# Patient Record
Sex: Female | Born: 1970
Health system: Southern US, Community
[De-identification: ages and names within clinical notes are randomized; demographics above are authoritative.]

## PROBLEM LIST (undated history)

## (undated) ENCOUNTER — Emergency Department (HOSPITAL_COMMUNITY): Admission: EM | Source: Home / Self Care

## (undated) DIAGNOSIS — E0590 Thyrotoxicosis, unspecified without thyrotoxic crisis or storm: Secondary | ICD-10-CM

## (undated) DIAGNOSIS — F419 Anxiety disorder, unspecified: Secondary | ICD-10-CM

## (undated) DIAGNOSIS — F32A Depression, unspecified: Secondary | ICD-10-CM

## (undated) DIAGNOSIS — B1920 Unspecified viral hepatitis C without hepatic coma: Secondary | ICD-10-CM

## (undated) DIAGNOSIS — N809 Endometriosis, unspecified: Secondary | ICD-10-CM

## (undated) DIAGNOSIS — M549 Dorsalgia, unspecified: Secondary | ICD-10-CM

## (undated) DIAGNOSIS — F329 Major depressive disorder, single episode, unspecified: Secondary | ICD-10-CM

## (undated) DIAGNOSIS — G8929 Other chronic pain: Secondary | ICD-10-CM

## (undated) HISTORY — PX: TONSILLECTOMY: SUR1361

## (undated) HISTORY — PX: RHINOPLASTY: SUR1284

---

## 1998-07-02 ENCOUNTER — Other Ambulatory Visit: Admission: RE | Admit: 1998-07-02 | Discharge: 1998-07-02 | Payer: Self-pay | Admitting: Obstetrics & Gynecology

## 1998-11-01 ENCOUNTER — Emergency Department (HOSPITAL_COMMUNITY): Admission: EM | Admit: 1998-11-01 | Discharge: 1998-11-01 | Payer: Self-pay | Admitting: Emergency Medicine

## 2000-01-29 ENCOUNTER — Other Ambulatory Visit: Admission: RE | Admit: 2000-01-29 | Discharge: 2000-01-29 | Payer: Self-pay | Admitting: Obstetrics & Gynecology

## 2001-07-25 ENCOUNTER — Other Ambulatory Visit: Admission: RE | Admit: 2001-07-25 | Discharge: 2001-07-25 | Payer: Self-pay | Admitting: Obstetrics & Gynecology

## 2001-09-02 ENCOUNTER — Ambulatory Visit (HOSPITAL_COMMUNITY): Admission: RE | Admit: 2001-09-02 | Discharge: 2001-09-02 | Payer: Self-pay | Admitting: Obstetrics & Gynecology

## 2001-09-02 ENCOUNTER — Encounter (INDEPENDENT_AMBULATORY_CARE_PROVIDER_SITE_OTHER): Payer: Self-pay

## 2002-02-24 ENCOUNTER — Emergency Department (HOSPITAL_COMMUNITY): Admission: EM | Admit: 2002-02-24 | Discharge: 2002-02-24 | Payer: Self-pay | Admitting: Emergency Medicine

## 2002-05-04 ENCOUNTER — Other Ambulatory Visit: Admission: RE | Admit: 2002-05-04 | Discharge: 2002-05-04 | Payer: Self-pay | Admitting: Obstetrics & Gynecology

## 2002-07-17 ENCOUNTER — Ambulatory Visit (HOSPITAL_COMMUNITY): Admission: RE | Admit: 2002-07-17 | Discharge: 2002-07-17 | Payer: Self-pay | Admitting: Family Medicine

## 2002-07-29 ENCOUNTER — Inpatient Hospital Stay (HOSPITAL_COMMUNITY): Admission: AD | Admit: 2002-07-29 | Discharge: 2002-07-29 | Payer: Self-pay | Admitting: *Deleted

## 2002-09-14 ENCOUNTER — Inpatient Hospital Stay (HOSPITAL_COMMUNITY): Admission: AD | Admit: 2002-09-14 | Discharge: 2002-09-16 | Payer: Self-pay | Admitting: Obstetrics & Gynecology

## 2002-12-26 ENCOUNTER — Other Ambulatory Visit: Admission: RE | Admit: 2002-12-26 | Discharge: 2002-12-26 | Payer: Self-pay | Admitting: Obstetrics & Gynecology

## 2003-05-21 ENCOUNTER — Emergency Department (HOSPITAL_COMMUNITY): Admission: AD | Admit: 2003-05-21 | Discharge: 2003-05-21 | Payer: Self-pay | Admitting: Family Medicine

## 2004-03-08 ENCOUNTER — Emergency Department (HOSPITAL_COMMUNITY): Admission: EM | Admit: 2004-03-08 | Discharge: 2004-03-08 | Payer: Self-pay | Admitting: Emergency Medicine

## 2004-05-08 ENCOUNTER — Ambulatory Visit (HOSPITAL_COMMUNITY): Admission: RE | Admit: 2004-05-08 | Discharge: 2004-05-08 | Payer: Self-pay | Admitting: Otolaryngology

## 2004-05-08 ENCOUNTER — Encounter (INDEPENDENT_AMBULATORY_CARE_PROVIDER_SITE_OTHER): Payer: Self-pay | Admitting: Specialist

## 2004-05-08 ENCOUNTER — Ambulatory Visit (HOSPITAL_BASED_OUTPATIENT_CLINIC_OR_DEPARTMENT_OTHER): Admission: RE | Admit: 2004-05-08 | Discharge: 2004-05-08 | Payer: Self-pay | Admitting: Otolaryngology

## 2006-01-07 ENCOUNTER — Ambulatory Visit: Payer: Self-pay | Admitting: Pulmonary Disease

## 2006-01-07 ENCOUNTER — Inpatient Hospital Stay (HOSPITAL_COMMUNITY): Admission: EM | Admit: 2006-01-07 | Discharge: 2006-01-08 | Payer: Self-pay | Admitting: Emergency Medicine

## 2006-02-21 ENCOUNTER — Emergency Department (HOSPITAL_COMMUNITY): Admission: EM | Admit: 2006-02-21 | Discharge: 2006-02-21 | Payer: Self-pay | Admitting: Emergency Medicine

## 2006-06-06 ENCOUNTER — Emergency Department (HOSPITAL_COMMUNITY): Admission: EM | Admit: 2006-06-06 | Discharge: 2006-06-06 | Payer: Self-pay | Admitting: Emergency Medicine

## 2006-12-02 ENCOUNTER — Inpatient Hospital Stay (HOSPITAL_COMMUNITY): Admission: AD | Admit: 2006-12-02 | Discharge: 2006-12-02 | Payer: Self-pay | Admitting: Obstetrics and Gynecology

## 2007-03-20 ENCOUNTER — Emergency Department (HOSPITAL_COMMUNITY): Admission: EM | Admit: 2007-03-20 | Discharge: 2007-03-20 | Payer: Self-pay | Admitting: Emergency Medicine

## 2007-08-08 ENCOUNTER — Inpatient Hospital Stay (HOSPITAL_COMMUNITY): Admission: AD | Admit: 2007-08-08 | Discharge: 2007-08-08 | Payer: Self-pay | Admitting: Family Medicine

## 2007-09-06 ENCOUNTER — Emergency Department (HOSPITAL_COMMUNITY): Admission: EM | Admit: 2007-09-06 | Discharge: 2007-09-06 | Payer: Self-pay | Admitting: Emergency Medicine

## 2009-10-02 ENCOUNTER — Ambulatory Visit: Payer: Self-pay | Admitting: Obstetrics and Gynecology

## 2009-10-02 LAB — CONVERTED CEMR LAB
Clue Cells Wet Prep HPF POC: NONE SEEN
HCV Ab: NEGATIVE
Hepatitis B Surface Ag: NEGATIVE
TSH: 0.503 microintl units/mL (ref 0.350–4.500)
Trich, Wet Prep: NONE SEEN
WBC, Wet Prep HPF POC: NONE SEEN

## 2009-12-04 ENCOUNTER — Ambulatory Visit: Payer: Self-pay | Admitting: Obstetrics and Gynecology

## 2010-05-28 ENCOUNTER — Encounter: Payer: Self-pay | Admitting: Family Medicine

## 2010-05-28 ENCOUNTER — Other Ambulatory Visit: Payer: Self-pay | Admitting: Family Medicine

## 2010-05-28 ENCOUNTER — Ambulatory Visit (INDEPENDENT_AMBULATORY_CARE_PROVIDER_SITE_OTHER): Payer: Self-pay | Admitting: Obstetrics and Gynecology

## 2010-05-28 DIAGNOSIS — N893 Dysplasia of vagina, unspecified: Secondary | ICD-10-CM

## 2010-05-28 DIAGNOSIS — N949 Unspecified condition associated with female genital organs and menstrual cycle: Secondary | ICD-10-CM

## 2010-05-28 DIAGNOSIS — R102 Pelvic and perineal pain: Secondary | ICD-10-CM

## 2010-05-28 LAB — CONVERTED CEMR LAB: GC Probe Amp, Genital: NEGATIVE

## 2010-05-29 ENCOUNTER — Encounter: Payer: Self-pay | Admitting: Family Medicine

## 2010-05-29 LAB — CONVERTED CEMR LAB: Yeast Wet Prep HPF POC: NONE SEEN

## 2010-06-03 ENCOUNTER — Ambulatory Visit (HOSPITAL_COMMUNITY): Payer: Self-pay

## 2010-06-11 ENCOUNTER — Ambulatory Visit: Payer: Self-pay | Admitting: Obstetrics and Gynecology

## 2010-06-12 ENCOUNTER — Encounter: Payer: Self-pay | Admitting: Obstetrics and Gynecology

## 2010-06-12 ENCOUNTER — Ambulatory Visit (HOSPITAL_COMMUNITY): Payer: Self-pay

## 2010-06-14 ENCOUNTER — Inpatient Hospital Stay (HOSPITAL_COMMUNITY)
Admission: AD | Admit: 2010-06-14 | Discharge: 2010-06-14 | Disposition: A | Discharge status: Home / Self Care | Payer: Self-pay | Source: Ambulatory Visit | Attending: Obstetrics & Gynecology | Admitting: Obstetrics & Gynecology

## 2010-06-14 DIAGNOSIS — M545 Low back pain, unspecified: Secondary | ICD-10-CM | POA: Insufficient documentation

## 2010-06-14 DIAGNOSIS — N949 Unspecified condition associated with female genital organs and menstrual cycle: Secondary | ICD-10-CM | POA: Insufficient documentation

## 2010-06-14 DIAGNOSIS — R109 Unspecified abdominal pain: Secondary | ICD-10-CM | POA: Insufficient documentation

## 2010-06-14 LAB — URINALYSIS, ROUTINE W REFLEX MICROSCOPIC
Bilirubin Urine: NEGATIVE
Glucose, UA: NEGATIVE mg/dL
Ketones, ur: NEGATIVE mg/dL
Leukocytes, UA: NEGATIVE
Nitrite: NEGATIVE
Protein, ur: NEGATIVE mg/dL
Specific Gravity, Urine: 1.005 (ref 1.005–1.030)
Urobilinogen, UA: 0.2 mg/dL (ref 0.0–1.0)
pH: 5.5 (ref 5.0–8.0)

## 2010-06-14 LAB — URINE MICROSCOPIC-ADD ON

## 2010-06-14 LAB — POCT PREGNANCY, URINE: Preg Test, Ur: NEGATIVE

## 2010-06-22 LAB — POCT URINALYSIS DIP (DEVICE)
Bilirubin Urine: NEGATIVE
Glucose, UA: NEGATIVE mg/dL
Ketones, ur: NEGATIVE mg/dL
Nitrite: NEGATIVE
Protein, ur: NEGATIVE mg/dL
Specific Gravity, Urine: <=1.005 (ref 1.005–1.030)
Urobilinogen, UA: 0.2 mg/dL (ref 0.0–1.0)
pH: 5 (ref 5.0–8.0)

## 2010-06-27 NOTE — Progress Notes (Unsigned)
Kristina Hardy, PONTARELLI              ACCOUNT NO.:  [id number]  MEDICAL RECORD NO.:  [id number]           PATIENT TYPE:  A  LOCATION:  [facility]                   FACILITY:  [facility]  PHYSICIAN:  Argentina Donovan, MD        DATE OF BIRTH:  [birth year]  DATE OF SERVICE:  05/28/2010                                 CLINIC NOTE  The patient is a 40 year old white female, gravida 3, para 3-0-0-3 who is an Charity fundraiser at [employer].  She has recurrent incapacitating dysmenorrhea, but not severe dyspareunia.  She has tried Depo-Provera, she bled every day on that.  She is over 43 and smokes.  She needs financial aid.  She has three children, all by different gentleman, and now has a paramour.  She is worried about infidelity.  Last night, she had severe pain after intercourse, with a very heavy terrible foul discharge.  She has recurrent BV.  I think she has adenomyosis by history.  She has had three normal vaginal deliveries and really needs a hysterectomy.  I am giving her Flagyl, to take for 2 weeks.  I did a wet prep, GC/chlamydia; examination did not seem to elicit much pain.  The uterus is of normal size, shape, and consistency.  The adnexa, because of full bladder, could not really feel the ovaries.  We talked about this.  She understands BV.  She understands adenomyosis. She has not taken the time really to get an application for financial aid here.  Although they offer her health insurance at work, she cannot afford it because it is 600 dollars a month.  IMPRESSION:  Dyspareunia and probable adenomyosis, recurrent BV, and terrible discharge with foul odor and emotional instability secondary to familial problems.          ______________________________ Argentina Donovan, MD    PR/MEDQ  D:  05/28/2010  T:  05/29/2010  Job:  [id number]
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
## 2010-12-31 LAB — CBC
HCT: 41.7
Hemoglobin: 14.4
MCHC: 34.7
MCV: 94.8
Platelets: 272
RBC: 4.4
RDW: 11.9
WBC: 11.4 — ABNORMAL HIGH

## 2010-12-31 LAB — URINALYSIS, ROUTINE W REFLEX MICROSCOPIC
Bilirubin Urine: NEGATIVE
Glucose, UA: NEGATIVE
Ketones, ur: NEGATIVE
Leukocytes, UA: NEGATIVE
Nitrite: NEGATIVE
Protein, ur: NEGATIVE
Specific Gravity, Urine: 1.015
Urobilinogen, UA: 0.2
pH: 5.5

## 2010-12-31 LAB — POCT PREGNANCY, URINE
Operator id: 242691
Preg Test, Ur: NEGATIVE

## 2010-12-31 LAB — WET PREP, GENITAL
Clue Cells Wet Prep HPF POC: NONE SEEN
Trich, Wet Prep: NONE SEEN
Yeast Wet Prep HPF POC: NONE SEEN

## 2010-12-31 LAB — GC/CHLAMYDIA PROBE AMP, GENITAL
Chlamydia, DNA Probe: NEGATIVE
GC Probe Amp, Genital: NEGATIVE

## 2010-12-31 LAB — URINE MICROSCOPIC-ADD ON

## 2011-01-12 LAB — D-DIMER, QUANTITATIVE: D-Dimer, Quant: <0.22

## 2011-01-16 LAB — URINALYSIS, ROUTINE W REFLEX MICROSCOPIC
Glucose, UA: NEGATIVE
Ketones, ur: NEGATIVE
Protein, ur: NEGATIVE
Urobilinogen, UA: 0.2

## 2011-01-16 LAB — WET PREP, GENITAL
Trich, Wet Prep: NONE SEEN
Yeast Wet Prep HPF POC: NONE SEEN

## 2011-01-16 LAB — URINE MICROSCOPIC-ADD ON

## 2011-10-21 ENCOUNTER — Other Ambulatory Visit: Payer: Self-pay | Admitting: Family Medicine

## 2011-10-21 DIAGNOSIS — Z1231 Encounter for screening mammogram for malignant neoplasm of breast: Secondary | ICD-10-CM

## 2011-10-27 ENCOUNTER — Emergency Department (HOSPITAL_COMMUNITY)
Admission: EM | Admit: 2011-10-27 | Discharge: 2011-10-28 | Disposition: A | Discharge status: Home / Self Care | Payer: Medicaid Other | Attending: Emergency Medicine | Admitting: Emergency Medicine

## 2011-10-27 ENCOUNTER — Emergency Department (HOSPITAL_COMMUNITY): Payer: Medicaid Other

## 2011-10-27 ENCOUNTER — Encounter (HOSPITAL_COMMUNITY): Payer: Self-pay | Admitting: *Deleted

## 2011-10-27 DIAGNOSIS — M25472 Effusion, left ankle: Secondary | ICD-10-CM

## 2011-10-27 DIAGNOSIS — B1920 Unspecified viral hepatitis C without hepatic coma: Secondary | ICD-10-CM | POA: Insufficient documentation

## 2011-10-27 DIAGNOSIS — F17200 Nicotine dependence, unspecified, uncomplicated: Secondary | ICD-10-CM | POA: Insufficient documentation

## 2011-10-27 DIAGNOSIS — E0590 Thyrotoxicosis, unspecified without thyrotoxic crisis or storm: Secondary | ICD-10-CM | POA: Insufficient documentation

## 2011-10-27 DIAGNOSIS — M7989 Other specified soft tissue disorders: Secondary | ICD-10-CM | POA: Insufficient documentation

## 2011-10-27 HISTORY — DX: Endometriosis, unspecified: N80.9

## 2011-10-27 HISTORY — DX: Thyrotoxicosis, unspecified without thyrotoxic crisis or storm: E05.90

## 2011-10-27 HISTORY — DX: Depression, unspecified: F32.A

## 2011-10-27 HISTORY — DX: Unspecified viral hepatitis C without hepatic coma: B19.20

## 2011-10-27 HISTORY — DX: Major depressive disorder, single episode, unspecified: F32.9

## 2011-10-27 HISTORY — DX: Anxiety disorder, unspecified: F41.9

## 2011-10-27 MED ORDER — OXYCODONE HCL 5 MG PO TABS: 5.0000 mg | ORAL_TABLET | ORAL | Status: AC | PRN

## 2011-10-27 NOTE — ED Provider Notes (Signed)
History     CSN: [id number]  Arrival date & time 10/27/11  2121   First MD Initiated Contact with Patient 10/27/11 2223      Chief Complaint  Patient presents with  . Ankle Pain    (Consider location/radiation/quality/duration/timing/severity/associated sxs/prior treatment) The history is provided by the patient.   41 year old female with hepatitis C and hyperthyroidism has not yet been treated presents to the emergency department with sudden onset gradually worsening lateral left ankle swelling and pain around 3 PM this afternoon. There is no known injury. She denies any weakness, numbness, skin color change, excess warmth. Denies any fever. No IV drug abuse. Symptoms worse with ambulation. Nothing makes it better. Has been practicing elevation and application of ice without any improvement. swelling or pain. No history of VT or PE, no recent prolonged immobility, no exogenous estrogen use.  Past Medical History  Diagnosis Date  . Hepatitis C   . Endometriosis   . Anxiety   . Depression   . Hyperthyroidism     History reviewed. No pertinent past surgical history.  No family history on file.  History  Substance Use Topics  . Smoking status: Current Everyday Smoker -- 2.0 packs/day  . Smokeless tobacco: Not on file  . Alcohol Use: Yes     Review of Systems Pertinent positives and negatives are reviewed in the history of present illness. Allergies  Nsaids and Vicodin  Home Medications   Current Outpatient Rx  Name Route Sig Dispense Refill  . ALPRAZOLAM 1 MG PO TABS Oral Take 1 mg by mouth 5 (five) times daily as needed. For anxiety.    . IBUPROFEN 200 MG PO TABS Oral Take 400 mg by mouth every 8 (eight) hours as needed. For pain.    [name] SERTRALINE HCL 50 MG PO TABS Oral Take 75 mg by mouth daily.      BP 119/86  Pulse 103  Temp 98.6 F (37 C)  Resp 20  SpO2 98%  LMP 10/13/2011  Physical Exam Vital signs are reviewed. Nursing note reviewed. Well-developed,  well-nourished female anxious appearing. Has normocephalic and atraumatic. Oromucosa moist. Conjunctiva are normal. Neck is supple. Heart with regular rate and rhythm. Bilateral dorsalis pedis pulses are 2+. Normal respiratory effort and excursion. Calves are supple and nontender. Edema to the lateral aspect of the left ankle without tenderness over the medial or lateral malleolus. Full range of motion with minimal pain. There is no overlying color change or excess warmth. No ankle instability on drawer testing. Plantar and dorsi flexion strength is 5 out of 5 and capillary refill is brisk. Patient is alert. Sensation is intact to light touch in bilateral lower extremities. Skin is warm and dry without any obvious rash or lesion. ED Course  Procedures (including critical care time)  Labs Reviewed - No data to display Dg Foot Complete Left  10/27/2011  *RADIOLOGY REPORT*  Clinical Data: Pain and swelling  LEFT FOOT - COMPLETE 3+ VIEW  Comparison: None.  Findings: Normal alignment without fracture.  Preserved joint spaces.  No significant arthropathy.  No radiographic swelling or foreign body.  IMPRESSION: No acute osseous finding  Original Report Authenticated By: Judie Petit. Ruel Favors, M.D.     1. Left ankle swelling       MDM  Left ankle swelling without any known injury. The area is not hot or red to suggest gout or septic arthritis. Imaging demonstrates no acute abnormality. No calf swelling or pain and no recent prolonged immobility or  exogenous estrogen use to suggest the vein thrombosis. I encouraged patient to followup with her regular Dr. if her symptoms aren't improving. Ace wrap was given in the emergency department to assist with compression. She has an allergy to NSAIDs and given her hepatitis C cannot tolerate acetaminophen. She is given a short course of oxycodone for pain as she has had no narcotics prescribed in the last 6 months in [place].        Shaaron Adler, PA-C 10/28/11 0028

## 2011-10-27 NOTE — ED Notes (Signed)
Pt c/o left foot swelling/pain; recently diagnosed with hep c and hyperthyroidism.  No known injury

## 2011-10-28 NOTE — ED Notes (Signed)
Ortho tech called for ACE wrap to L ankle.

## 2011-10-28 NOTE — ED Notes (Signed)
Ortho tech here to apply ACE wrap to L ankle.

## 2011-10-29 NOTE — ED Provider Notes (Signed)
Medical screening examination/treatment/procedure(s) were performed by non-physician practitioner and as supervising physician I was immediately available for consultation/collaboration.  Raeford Razor, MD 10/29/11 1038

## 2011-11-09 ENCOUNTER — Ambulatory Visit (HOSPITAL_COMMUNITY): Payer: Self-pay | Admitting: Psychiatry

## 2011-11-17 ENCOUNTER — Emergency Department (HOSPITAL_COMMUNITY)
Admission: EM | Admit: 2011-11-17 | Discharge: 2011-11-17 | Disposition: A | Discharge status: Home / Self Care | Payer: Medicaid Other | Attending: Emergency Medicine | Admitting: Emergency Medicine

## 2011-11-17 ENCOUNTER — Encounter (HOSPITAL_COMMUNITY): Payer: Self-pay | Admitting: Emergency Medicine

## 2011-11-17 ENCOUNTER — Emergency Department (HOSPITAL_COMMUNITY): Payer: Medicaid Other

## 2011-11-17 DIAGNOSIS — F17200 Nicotine dependence, unspecified, uncomplicated: Secondary | ICD-10-CM | POA: Insufficient documentation

## 2011-11-17 DIAGNOSIS — IMO0002 Reserved for concepts with insufficient information to code with codable children: Secondary | ICD-10-CM | POA: Insufficient documentation

## 2011-11-17 DIAGNOSIS — S022XXA Fracture of nasal bones, initial encounter for closed fracture: Secondary | ICD-10-CM | POA: Insufficient documentation

## 2011-11-17 DIAGNOSIS — E0590 Thyrotoxicosis, unspecified without thyrotoxic crisis or storm: Secondary | ICD-10-CM | POA: Insufficient documentation

## 2011-11-17 DIAGNOSIS — F341 Dysthymic disorder: Secondary | ICD-10-CM | POA: Insufficient documentation

## 2011-11-17 MED ORDER — OXYCODONE-ACETAMINOPHEN 5-325 MG PO TABS: 2.0000 | ORAL_TABLET | Freq: Once | ORAL | Status: DC

## 2011-11-17 MED ORDER — OXYCODONE-ACETAMINOPHEN 5-325 MG PO TABS: 1.0000 | ORAL_TABLET | ORAL | Status: DC | PRN

## 2011-11-17 MED ORDER — OXYCODONE HCL 5 MG PO TABS: 5.0000 mg | ORAL_TABLET | ORAL | Status: AC | PRN

## 2011-11-17 NOTE — ED Notes (Signed)
[place], has bruising under left eye, - no c/o severe headache, taking ibuprofen- not working.
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
## 2011-11-18 ENCOUNTER — Ambulatory Visit: Payer: Self-pay

## 2012-01-04 ENCOUNTER — Ambulatory Visit (HOSPITAL_COMMUNITY): Payer: Medicaid Other | Attending: Family Medicine

## 2012-01-06 ENCOUNTER — Ambulatory Visit: Payer: Self-pay | Admitting: Obstetrics & Gynecology

## 2012-01-12 ENCOUNTER — Encounter (HOSPITAL_COMMUNITY): Payer: Self-pay | Admitting: Emergency Medicine

## 2012-01-12 ENCOUNTER — Emergency Department (HOSPITAL_COMMUNITY)
Admission: EM | Admit: 2012-01-12 | Discharge: 2012-01-13 | Disposition: A | Discharge status: Home / Self Care | Payer: Medicaid Other | Attending: Emergency Medicine | Admitting: Emergency Medicine

## 2012-01-12 DIAGNOSIS — T50901A Poisoning by unspecified drugs, medicaments and biological substances, accidental (unintentional), initial encounter: Secondary | ICD-10-CM | POA: Insufficient documentation

## 2012-01-12 DIAGNOSIS — IMO0002 Reserved for concepts with insufficient information to code with codable children: Secondary | ICD-10-CM | POA: Insufficient documentation

## 2012-01-12 DIAGNOSIS — Z8619 Personal history of other infectious and parasitic diseases: Secondary | ICD-10-CM | POA: Insufficient documentation

## 2012-01-12 DIAGNOSIS — T50904A Poisoning by unspecified drugs, medicaments and biological substances, undetermined, initial encounter: Secondary | ICD-10-CM | POA: Insufficient documentation

## 2012-01-12 DIAGNOSIS — G8929 Other chronic pain: Secondary | ICD-10-CM | POA: Insufficient documentation

## 2012-01-12 DIAGNOSIS — F17200 Nicotine dependence, unspecified, uncomplicated: Secondary | ICD-10-CM | POA: Insufficient documentation

## 2012-01-12 HISTORY — DX: Other chronic pain: G89.29

## 2012-01-12 HISTORY — DX: Dorsalgia, unspecified: M54.9

## 2012-01-12 LAB — COMPREHENSIVE METABOLIC PANEL
Albumin: 4.6 g/dL (ref 3.5–5.2)
BUN: 5 mg/dL — ABNORMAL LOW (ref 6–23)
CO2: 28 mEq/L (ref 19–32)
Calcium: 10.8 mg/dL — ABNORMAL HIGH (ref 8.4–10.5)
Chloride: 103 mEq/L (ref 96–112)
Creatinine, Ser: 0.64 mg/dL (ref 0.50–1.10)
GFR calc non Af Amer: >90 mL/min (ref >90)
Total Bilirubin: 0.8 mg/dL (ref 0.3–1.2)

## 2012-01-12 LAB — ACETAMINOPHEN LEVEL: Acetaminophen (Tylenol), Serum: <15 ug/mL (ref 10–30)

## 2012-01-12 LAB — CBC
HCT: 46.1 % — ABNORMAL HIGH (ref 36.0–46.0)
MCH: 32.1 pg (ref 26.0–34.0)
MCV: 90.7 fL (ref 78.0–100.0)
RDW: 12.4 % (ref 11.5–15.5)
WBC: 11.7 10*3/uL — ABNORMAL HIGH (ref 4.0–10.5)

## 2012-01-12 LAB — ETHANOL: Alcohol, Ethyl (B): 12 mg/dL — ABNORMAL HIGH (ref 0–11)

## 2012-01-12 LAB — SALICYLATE LEVEL: Salicylate Lvl: <2 mg/dL — ABNORMAL LOW (ref 2.8–20.0)

## 2012-01-12 MED ORDER — ONDANSETRON 4 MG PO TBDP
4.0000 mg | ORAL_TABLET | Freq: Once | ORAL | Status: AC
  Administered 2012-01-13: 4 mg via ORAL
  Filled 2012-01-12: qty 1

## 2012-01-12 MED ORDER — ONDANSETRON HCL 4 MG/2ML IJ SOLN
INTRAMUSCULAR | Status: DC
Start: 2012-01-12 — End: 2012-01-13
  Filled 2012-01-12: qty 2

## 2012-01-12 MED ORDER — ACETAMINOPHEN 325 MG PO TABS
650.0000 mg | ORAL_TABLET | Freq: Once | ORAL | Status: DC
  Filled 2012-01-12: qty 2
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
## 2012-01-12 NOTE — ED Notes (Signed)
Blood work drawn by Judyann Munson

## 2012-01-12 NOTE — ED Notes (Signed)
Charge RN and [facility] aware of pt

## 2012-01-12 NOTE — ED Notes (Signed)
The pt is c/o her mouth burning and she wants something for that something for diarrhea and some pain med for the pain in her head neck and back.  She wants her personal belongings given to her boyfriend who is at the bedside

## 2012-01-12 NOTE — ED Notes (Signed)
Pt wanded by security. 

## 2012-01-12 NOTE — ED Provider Notes (Signed)
History     CSN: [id number]  Arrival date & time 01/12/12  2106   First MD Initiated Contact with Patient 01/12/12 2306      Chief Complaint  Patient presents with  . Drug Overdose    (Consider location/radiation/quality/duration/timing/severity/associated sxs/prior treatment) HPI HX per PT. Took OD of Zoloft 50mg  #15 tabs about 45 min PTA. PT upset when her pain management physician refused to increase her pain medications and PCP refused to refill narcotics. TENS unit not helping. Followed by Dr Ethelene Hal and DR Cyndia [facility]).  PT has chronic back and neck pain, endometriosis and multiple medical problems. N/V after talking zoloft.  No CP or SOB. She denies SI/ HI. Room mate called 911 when she took medications and vomited. She c/o ongoing neck pain. Sharp pain unchanged. No weakness or numbness.   Past Medical History  Diagnosis Date  . Hepatitis C   . Endometriosis   . Anxiety   . Depression   . Hyperthyroidism   . Chronic back pain     Past Surgical History  Procedure Date  . Rhinoplasty   . Tonsillectomy     History reviewed. No pertinent family history.  History  Substance Use Topics  . Smoking status: Current Every Day Smoker -- 1.0 packs/day    Types: Cigarettes  . Smokeless tobacco: Not on file  . Alcohol Use: No    OB History    Grav Para Term Preterm Abortions TAB SAB Ect Mult Living                  Review of Systems  Constitutional: Negative for fever and chills.  HENT: Negative for neck pain and neck stiffness.   Eyes: Negative for pain.  Respiratory: Negative for shortness of breath.   Cardiovascular: Negative for chest pain.  Gastrointestinal: Negative for abdominal pain.  Genitourinary: Negative for dysuria.  Musculoskeletal: Negative for back pain.  Skin: Negative for rash.  Neurological: Negative for headaches.  Psychiatric/Behavioral: Negative for hallucinations.       Agitated and tearful  All other systems reviewed and are  negative.    Allergies  Nsaids and Vicodin  Home Medications   Current Outpatient Rx  Name Route Sig Dispense Refill  . ALPRAZOLAM 1 MG PO TABS Oral Take 1 mg by mouth 5 (five) times daily as needed. For anxiety.    . IBUPROFEN 200 MG PO TABS Oral Take 400 mg by mouth every 8 (eight) hours as needed. For pain.    [name] SERTRALINE HCL 50 MG PO TABS Oral Take 75 mg by mouth daily.      BP 130/100  Pulse 83  Temp 98.3 F (36.8 C) (Oral)  Resp 20  SpO2 100%  Physical Exam  Constitutional: She is oriented to person, place, and time. She appears well-developed and well-nourished.  HENT:  Head: Normocephalic and atraumatic.  Eyes: Conjunctivae normal and EOM are normal. Pupils are equal, round, and reactive to light.  Neck: Trachea normal. Neck supple. No thyromegaly present.  Cardiovascular: Normal rate, regular rhythm, S1 normal, S2 normal and normal pulses.     No systolic murmur is present   No diastolic murmur is present  Pulses:      Radial pulses are 2+ on the right side, and 2+ on the left side.  Pulmonary/Chest: Effort normal and breath sounds normal. She has no wheezes. She has no rhonchi. She has no rales. She exhibits no tenderness.  Abdominal: Soft. Normal appearance and bowel sounds are  normal. There is no tenderness. There is no CVA tenderness and negative Murphy's sign.  Musculoskeletal:       BLE:s Calves nontender, no cords or erythema, negative Homans sign  Neurological: She is alert and oriented to person, place, and time. She has normal strength. No cranial nerve deficit or sensory deficit. GCS eye subscore is 4. GCS verbal subscore is 5. GCS motor subscore is 6.  Skin: Skin is warm and dry. No rash noted. She is not diaphoretic.  Psychiatric: Her speech is normal.       Tearful, agitated    ED Course  Procedures (including critical care time)  Results for orders placed during the hospital encounter of 01/12/12  CBC      Component Value Range   WBC 11.7  (*) 4.0 - 10.5 K/uL   RBC 5.08  3.87 - 5.11 MIL/uL   Hemoglobin 16.3 (*) 12.0 - 15.0 g/dL   HCT 16.1 (*) 09.6 - 04.5 %   MCV 90.7  78.0 - 100.0 fL   MCH 32.1  26.0 - 34.0 pg   MCHC 35.4  30.0 - 36.0 g/dL   RDW 40.9  81.1 - 91.4 %   Platelets 361  150 - 400 K/uL  COMPREHENSIVE METABOLIC PANEL      Component Value Range   Sodium 142  135 - 145 mEq/L   Potassium 4.0  3.5 - 5.1 mEq/L   Chloride 103  96 - 112 mEq/L   CO2 28  19 - 32 mEq/L   Glucose, Bld 126 (*) 70 - 99 mg/dL   BUN 5 (*) 6 - 23 mg/dL   Creatinine, Ser 7.82  0.50 - 1.10 mg/dL   Calcium 95.6 (*) 8.4 - 10.5 mg/dL   Total Protein 8.9 (*) 6.0 - 8.3 g/dL   Albumin 4.6  3.5 - 5.2 g/dL   AST 19  0 - 37 U/L   ALT 16  0 - 35 U/L   Alkaline Phosphatase 65  39 - 117 U/L   Total Bilirubin 0.8  0.3 - 1.2 mg/dL   GFR calc non Af Amer >90  >90 mL/min   GFR calc Af Amer >90  >90 mL/min  ETHANOL      Component Value Range   Alcohol, Ethyl (B) 12 (*) 0 - 11 mg/dL  ACETAMINOPHEN LEVEL      Component Value Range   Acetaminophen (Tylenol), Serum <15.0  10 - 30 ug/mL  SALICYLATE LEVEL      Component Value Range   Salicylate Lvl <2.0 (*) 2.8 - 20.0 mg/dL    Date: 21/30/8657  Rate: 41  Rhythm: normal sinus rhythm  QRS Axis: normal  Intervals: normal  ST/T Wave abnormalities: nonspecific ST changes  Conduction Disutrbances:none  Narrative Interpretation:   Old EKG Reviewed: none available  ACT consult and tele psych consult requested.  12:01 AM d/w ACT - will see in ED. PT maintains she is not suicidal, requesting narcotics.    1:24 AM PSY consult, Dr Jacky Kindle feels PT not suicidal can be discharged home with outpatient referrals. PT agreeable to plan - will follow up PCP.  MDM   Room mate called 911 for N/V after taking 15 Zolofts 50mg . PT observed in the ED, no adverse drug effects. PT denies any SI, states she wanted to get attention due to frustration over her chronic pain management and her PCP appropriately no longer  prescribing narcotics, as she has a pain management physician. Pyschiatric consultation obtained as above. Previous records  reviewed. VS and nursing notes reviewed. ECG and labs as above.         Sunnie Nielsen, MD 01/14/12 2009

## 2012-01-12 NOTE — ED Notes (Addendum)
Per EMS, took 15 zolofts (50mg ) 45 min ago; 12 lead done, vomiting--zofran given; IV started 18g LAC, pt a&ox4 107 BS, 102 palp sys BP

## 2012-01-12 NOTE — ED Notes (Addendum)
Notified poison control about pt, suggest supportive care with fluids and checking labs

## 2012-01-12 NOTE — ED Notes (Signed)
Unsuccessful blood draw attempt x2. 

## 2012-01-12 NOTE — ED Notes (Addendum)
Reports took 15-20 zoloft d/t mad at pain management doctor/PCP; called for refill on pain meds--and was refused refill; reports was trying to get attention, not trying to harm self; reports roommate was scared and called 911; pt has vomited; denies SI or HI

## 2012-01-13 MED ORDER — LOPERAMIDE HCL 2 MG PO CAPS
2.0000 mg | ORAL_CAPSULE | Freq: Once | ORAL | Status: AC
  Administered 2012-01-13: 2 mg via ORAL
  Filled 2012-01-13: qty 1

## 2012-01-13 MED ORDER — GI COCKTAIL ~~LOC~~
30.0000 mL | Freq: Once | ORAL | Status: AC
  Administered 2012-01-13: 30 mL via ORAL
  Filled 2012-01-13: qty 30

## 2012-01-13 MED ORDER — IBUPROFEN 200 MG PO TABS
400.0000 mg | ORAL_TABLET | Freq: Once | ORAL | Status: AC
  Administered 2012-01-13: 400 mg via ORAL
  Filled 2012-01-13: qty 2

## 2012-01-13 MED ORDER — CLONIDINE HCL 0.2 MG PO TABS: 0.1000 mg | ORAL_TABLET | Freq: Two times a day (BID) | ORAL | Status: DC

## 2012-01-13 NOTE — ED Notes (Signed)
The pt would not take the tylenol ordered because she has  Hep c

## 2012-01-13 NOTE — ED Notes (Signed)
The pt reports that she cannot void at present 

## 2012-01-13 NOTE — ED Notes (Signed)
Pt sitting in room waiting to talk to Dr Dierdre Highman.

## 2012-01-13 NOTE — ED Notes (Signed)
Pt left room stating this is ridiculous, I'm leaving. Called pt to give her her discharge papers and bottle of Zoloft, Pt refused to respond or stop, followed pt down hallway to lobby calling for her but pt still refused to stop.

## 2012-01-13 NOTE — ED Notes (Signed)
Telephsyc inprocess

## 2012-01-13 NOTE — ED Notes (Signed)
Discussed discharge instructions with pt. Pt then began demanding something else for pain and an RX for pain med. Told pt that Dr Dierdre Highman already stated he was not giving her an narcotics. Pt then asked for a RX for Suboxone. Told pt Dr Dierdre Highman was not going to give that to her either. Pt then asked to speak to Dr Dierdre Highman, Dr Dierdre Highman notified.

## 2012-02-04 ENCOUNTER — Ambulatory Visit: Payer: Self-pay | Admitting: Gastroenterology

## 2012-02-29 ENCOUNTER — Other Ambulatory Visit (HOSPITAL_COMMUNITY): Payer: Self-pay | Admitting: Family Medicine

## 2012-02-29 DIAGNOSIS — Z1231 Encounter for screening mammogram for malignant neoplasm of breast: Secondary | ICD-10-CM

## 2012-03-18 ENCOUNTER — Ambulatory Visit (HOSPITAL_COMMUNITY): Payer: Medicaid Other | Attending: Family Medicine

## 2012-07-12 ENCOUNTER — Ambulatory Visit (HOSPITAL_COMMUNITY): Payer: Medicaid Other | Attending: Family Medicine
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
## 2015-08-19 ENCOUNTER — Emergency Department (HOSPITAL_COMMUNITY)
Admission: EM | Admit: 2015-08-19 | Discharge: 2015-08-19 | Disposition: A | Discharge status: Home / Self Care | Payer: Medicaid Other | Attending: Emergency Medicine | Admitting: Emergency Medicine

## 2015-08-19 ENCOUNTER — Encounter (HOSPITAL_COMMUNITY): Payer: Self-pay | Admitting: Emergency Medicine

## 2015-08-19 DIAGNOSIS — Z79899 Other long term (current) drug therapy: Secondary | ICD-10-CM | POA: Diagnosis not present

## 2015-08-19 DIAGNOSIS — F17210 Nicotine dependence, cigarettes, uncomplicated: Secondary | ICD-10-CM | POA: Diagnosis not present

## 2015-08-19 DIAGNOSIS — M545 Low back pain: Secondary | ICD-10-CM | POA: Diagnosis not present

## 2015-08-19 DIAGNOSIS — F329 Major depressive disorder, single episode, unspecified: Secondary | ICD-10-CM | POA: Diagnosis not present

## 2015-08-19 NOTE — ED Notes (Signed)
MD at bedside. 

## 2015-08-19 NOTE — ED Notes (Signed)
Family at bedside. 

## 2015-08-19 NOTE — Discharge Instructions (Signed)
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
## 2018-07-08 MED FILL — oxyCODONE HCL 10 MG TABS: 10 | 15 days supply | Qty: 60 | Fill #0

## 2018-07-22 MED FILL — oxyCODONE HCL 10 MG TABS: 10 | 15 days supply | Qty: 60 | Fill #0

## 2018-08-06 MED FILL — oxyCODONE HCL 10 MG TABS: 10 | 15 days supply | Qty: 60 | Fill #0

## 2018-08-19 MED FILL — oxyCODONE HCL 10 MG TABS: 10 | 15 days supply | Qty: 60 | Fill #0

## 2018-09-02 MED FILL — oxyCODONE HCL 10 MG TABS: 10 | 15 days supply | Qty: 60 | Fill #0

## 2018-09-16 MED FILL — oxyCODONE HCL 10 MG TABS: 10 | 15 days supply | Qty: 60 | Fill #0

## 2018-09-30 MED FILL — oxyCODONE HCL 10 MG TABS: 10 | 15 days supply | Qty: 60 | Fill #0

## 2018-10-17 MED FILL — oxyCODONE HCL 10 MG TABS: 10 | 15 days supply | Qty: 60 | Fill #0

## 2018-10-29 MED FILL — oxyCODONE HCL 10 MG TABS: 10 | 15 days supply | Qty: 60 | Fill #0

## 2018-11-14 MED FILL — oxyCODONE HCL 10 MG TABS: 10 | 15 days supply | Qty: 60 | Fill #0

## 2018-11-28 MED FILL — oxyCODONE HCL 10 MG TABS: 10 | 15 days supply | Qty: 60 | Fill #0

## 2018-12-09 MED FILL — oxyCODONE HCL 10 MG TABS: 10 | 15 days supply | Qty: 60 | Fill #0

## 2018-12-26 MED FILL — oxyCODONE HCL 10 MG TABS: 10 | 15 days supply | Qty: 60 | Fill #0

## 2019-01-09 MED FILL — oxyCODONE HCL 10 MG TABS: 10 | 15 days supply | Qty: 60 | Fill #0

## 2019-01-23 MED FILL — oxyCODONE HCL 10 MG TABS: 10 | 15 days supply | Qty: 60 | Fill #0

## 2019-02-07 MED FILL — oxyCODONE HCL 10 MG TABS: 10 | 15 days supply | Qty: 60 | Fill #0

## 2019-02-20 MED FILL — CYCLOBENZAPRINE HCL 10 MG T: 10 | 20 days supply | Qty: 60 | Fill #0

## 2019-02-20 MED FILL — oxyCODONE HCL 10 MG TABS: 10 | 15 days supply | Qty: 60 | Fill #0

## 2019-03-07 MED FILL — oxyCODONE HCL 10 MG TABS: 10 | 15 days supply | Qty: 60 | Fill #0

## 2019-03-21 MED FILL — oxyCODONE HCL 10 MG TABS: 10 | 15 days supply | Qty: 60 | Fill #0

## 2019-04-03 MED FILL — oxyCODONE HCL 10 MG TABS: 10 | 15 days supply | Qty: 60 | Fill #0

## 2019-04-06 MED FILL — CYCLOBENZAPRINE HCL 10 MG T: 10 | 20 days supply | Qty: 60 | Fill #0

## 2019-04-17 MED FILL — oxyCODONE HCL 10 MG TABS: 10 | 15 days supply | Qty: 60 | Fill #0

## 2019-05-01 MED FILL — oxyCODONE HCL 10 MG TABS: 10 | 15 days supply | Qty: 60 | Fill #0

## 2019-05-11 MED FILL — CYCLOBENZAPRINE HCL 10 MG T: 10 | 20 days supply | Qty: 60 | Fill #1

## 2019-05-15 MED FILL — oxyCODONE HCL 10 MG TABS: 10 | 15 days supply | Qty: 60 | Fill #0

## 2019-05-30 MED FILL — oxyCODONE HCL 10 MG TABS: 10 | 15 days supply | Qty: 60 | Fill #0

## 2019-06-13 MED FILL — oxyCODONE HCL 10 MG TABS: 10 | 15 days supply | Qty: 60 | Fill #0

## 2019-06-26 MED FILL — oxyCODONE HCL 10 MG TABS: 10 | 15 days supply | Qty: 60 | Fill #0

## 2019-06-26 MED FILL — CYCLOBENZAPRINE HCL 10 MG T: 10 | 20 days supply | Qty: 60 | Fill #0

## 2019-07-11 MED FILL — oxyCODONE HCL 10 MG TABS: 10 | 15 days supply | Qty: 60 | Fill #0

## 2019-07-24 MED FILL — CYCLOBENZAPRINE HCL 10 MG T: 10 | 30 days supply | Qty: 90 | Fill #0

## 2019-07-24 MED FILL — oxyCODONE HCL 10 MG TABS: 10 | 15 days supply | Qty: 60 | Fill #0

## 2019-08-07 MED FILL — oxyCODONE HCL 10 MG TABS: 10 | 15 days supply | Qty: 60 | Fill #0

## 2019-08-21 MED FILL — oxyCODONE HCL 10 MG TABS: 10 | 15 days supply | Qty: 60 | Fill #0

## 2019-09-05 MED FILL — oxyCODONE HCL 10 MG TABS: 10 | 15 days supply | Qty: 60 | Fill #0

## 2019-09-18 MED FILL — oxyCODONE HCL 10 MG TABS: 10 | 15 days supply | Qty: 60 | Fill #0

## 2019-10-03 MED FILL — oxyCODONE HCL 10 MG TABS: 10 | 15 days supply | Qty: 60 | Fill #0

## 2019-10-17 MED FILL — oxyCODONE HCL 10 MG TABS: 10 | 15 days supply | Qty: 60 | Fill #0

## 2019-10-31 MED FILL — oxyCODONE HCL 10 MG TABS: 10 | 15 days supply | Qty: 60 | Fill #0

## 2019-11-15 MED FILL — oxyCODONE HCL 10 MG TABS: 10 | 15 days supply | Qty: 60 | Fill #0

## 2019-11-29 MED FILL — oxyCODONE HCL 10 MG TABS: 10 | 15 days supply | Qty: 60 | Fill #0

## 2019-12-12 MED FILL — oxyCODONE HCL 10 MG TABS: 10 | 15 days supply | Qty: 60 | Fill #0

## 2019-12-27 MED FILL — oxyCODONE HCL 10 MG TABS: 10 | 15 days supply | Qty: 60 | Fill #0

## 2020-01-09 ENCOUNTER — Other Ambulatory Visit (HOSPITAL_COMMUNITY): Payer: Self-pay | Admitting: Chiropractic Medicine

## 2020-01-09 MED FILL — oxyCODONE HCL 10 MG TABS: 10 | 15 days supply | Qty: 60 | Fill #0

## 2020-01-23 ENCOUNTER — Other Ambulatory Visit (HOSPITAL_COMMUNITY): Payer: Self-pay | Admitting: Chiropractic Medicine

## 2020-01-23 MED FILL — oxyCODONE HCL 10 MG TABS: 10 | 15 days supply | Qty: 60 | Fill #0

## 2020-02-06 ENCOUNTER — Other Ambulatory Visit (HOSPITAL_COMMUNITY): Payer: Self-pay | Admitting: Chiropractic Medicine

## 2020-02-06 MED FILL — oxyCODONE HCL 10 MG TABS: 10 | 15 days supply | Qty: 60 | Fill #0

## 2020-02-13 ENCOUNTER — Other Ambulatory Visit (HOSPITAL_COMMUNITY): Payer: Self-pay | Admitting: Chiropractic Medicine

## 2020-02-13 MED FILL — CYCLOBENZAPRINE HCL 10 MG T: 10 | 30 days supply | Qty: 90 | Fill #0

## 2020-02-20 ENCOUNTER — Other Ambulatory Visit (HOSPITAL_COMMUNITY): Payer: Self-pay | Admitting: Chiropractic Medicine

## 2020-02-20 MED FILL — oxyCODONE HCL 10 MG TABS: 10 | 15 days supply | Qty: 60 | Fill #0

## 2020-03-06 ENCOUNTER — Other Ambulatory Visit (HOSPITAL_COMMUNITY): Payer: Self-pay | Admitting: Chiropractic Medicine

## 2020-03-06 MED FILL — oxyCODONE HCL 10 MG TABS: 10 | 15 days supply | Qty: 60 | Fill #0

## 2020-03-20 ENCOUNTER — Other Ambulatory Visit (HOSPITAL_COMMUNITY): Payer: Self-pay | Admitting: Chiropractic Medicine

## 2020-03-20 MED FILL — CYCLOBENZAPRINE HCL 10 MG T: 10 | 30 days supply | Qty: 90 | Fill #0

## 2020-03-20 MED FILL — oxyCODONE HCL 10 MG TABS: 10 | 15 days supply | Qty: 60 | Fill #0

## 2020-04-02 ENCOUNTER — Other Ambulatory Visit (HOSPITAL_COMMUNITY): Payer: Self-pay | Admitting: Chiropractic Medicine

## 2020-04-02 MED FILL — oxyCODONE HCL 10 MG TABS: 10 | 15 days supply | Qty: 60 | Fill #0

## 2020-04-16 ENCOUNTER — Other Ambulatory Visit (HOSPITAL_COMMUNITY): Payer: Self-pay | Admitting: Chiropractic Medicine

## 2020-04-16 ENCOUNTER — Other Ambulatory Visit: Payer: Medicaid Other

## 2020-04-16 DIAGNOSIS — Z20822 Contact with and (suspected) exposure to covid-19: Secondary | ICD-10-CM

## 2020-04-16 MED FILL — oxyCODONE HCL 10 MG TABS: 10 | 15 days supply | Qty: 60 | Fill #0

## 2020-04-17 LAB — SARS-COV-2, NAA 2 DAY TAT

## 2020-04-17 LAB — NOVEL CORONAVIRUS, NAA: SARS-CoV-2, NAA: NOT DETECTED

## 2020-04-30 ENCOUNTER — Other Ambulatory Visit (HOSPITAL_COMMUNITY): Payer: Self-pay | Admitting: Chiropractic Medicine

## 2020-04-30 MED FILL — oxyCODONE HCL 10 MG TABS: 10 | 15 days supply | Qty: 60 | Fill #0

## 2020-05-14 ENCOUNTER — Other Ambulatory Visit (HOSPITAL_COMMUNITY): Payer: Self-pay | Admitting: Chiropractic Medicine

## 2020-05-14 MED FILL — oxyCODONE HCL 10 MG TABS: 10 | 15 days supply | Qty: 60 | Fill #0

## 2020-05-29 ENCOUNTER — Other Ambulatory Visit (HOSPITAL_COMMUNITY): Payer: Self-pay | Admitting: Chiropractic Medicine

## 2020-05-29 MED FILL — oxyCODONE HCL 10 MG TABS: 10 | 15 days supply | Qty: 60 | Fill #0

## 2020-06-12 ENCOUNTER — Other Ambulatory Visit (HOSPITAL_COMMUNITY): Payer: Self-pay | Admitting: Chiropractic Medicine

## 2020-06-12 MED FILL — oxyCODONE HCL 10 MG TABS: 10 | 15 days supply | Qty: 60 | Fill #0

## 2020-06-19 ENCOUNTER — Other Ambulatory Visit (HOSPITAL_COMMUNITY): Payer: Self-pay | Admitting: Chiropractic Medicine

## 2020-06-26 MED FILL — oxyCODONE HCL 10 MG TABS: 10 | 15 days supply | Qty: 60 | Fill #0

## 2020-07-09 ENCOUNTER — Other Ambulatory Visit (HOSPITAL_COMMUNITY): Payer: Self-pay

## 2020-07-09 MED ORDER — OXYCODONE HCL 10 MG PO TABS
10.0000 mg | ORAL_TABLET | Freq: Four times a day (QID) | ORAL | 0 refills | Status: DC | PRN
  Filled 2020-07-09: qty 60, 15d supply, fill #0

## 2020-07-10 ENCOUNTER — Other Ambulatory Visit (HOSPITAL_COMMUNITY): Payer: Self-pay

## 2020-07-23 ENCOUNTER — Other Ambulatory Visit (HOSPITAL_COMMUNITY): Payer: Self-pay

## 2020-07-23 MED ORDER — OXYCODONE HCL 10 MG PO TABS
10.0000 mg | ORAL_TABLET | Freq: Four times a day (QID) | ORAL | 0 refills | Status: DC | PRN
  Filled 2020-07-23: qty 60, 15d supply, fill #0

## 2020-07-24 ENCOUNTER — Other Ambulatory Visit (HOSPITAL_COMMUNITY): Payer: Self-pay

## 2020-08-07 ENCOUNTER — Other Ambulatory Visit (HOSPITAL_COMMUNITY): Payer: Self-pay

## 2020-08-07 MED ORDER — OXYCODONE HCL 10 MG PO TABS
10.0000 mg | ORAL_TABLET | Freq: Four times a day (QID) | ORAL | 0 refills | Status: DC | PRN
  Filled 2020-08-07: qty 60, 15d supply, fill #0

## 2020-08-21 ENCOUNTER — Other Ambulatory Visit (HOSPITAL_COMMUNITY): Payer: Self-pay

## 2020-08-21 MED ORDER — OXYCODONE HCL 10 MG PO TABS
10.0000 mg | ORAL_TABLET | Freq: Four times a day (QID) | ORAL | 0 refills | Status: DC | PRN
  Filled 2020-08-21: qty 60, 15d supply, fill #0

## 2020-09-03 ENCOUNTER — Other Ambulatory Visit (HOSPITAL_COMMUNITY): Payer: Self-pay

## 2020-09-03 MED ORDER — OXYCODONE HCL 10 MG PO TABS
10.0000 mg | ORAL_TABLET | Freq: Four times a day (QID) | ORAL | 0 refills | Status: DC | PRN
  Filled 2020-09-03: qty 60, 15d supply, fill #0

## 2020-09-17 ENCOUNTER — Other Ambulatory Visit (HOSPITAL_COMMUNITY): Payer: Self-pay

## 2020-09-17 MED ORDER — OXYCODONE HCL 10 MG PO TABS
10.0000 mg | ORAL_TABLET | Freq: Four times a day (QID) | ORAL | 0 refills | Status: DC | PRN
  Filled 2020-09-17: qty 60, 15d supply, fill #0

## 2020-10-01 ENCOUNTER — Other Ambulatory Visit (HOSPITAL_COMMUNITY): Payer: Self-pay

## 2020-10-01 MED ORDER — OXYCODONE HCL 10 MG PO TABS
10.0000 mg | ORAL_TABLET | Freq: Four times a day (QID) | ORAL | 0 refills | Status: DC | PRN
  Filled 2020-10-01: qty 60, 15d supply, fill #0

## 2020-10-15 ENCOUNTER — Other Ambulatory Visit (HOSPITAL_COMMUNITY): Payer: Self-pay

## 2020-10-15 MED ORDER — OXYCODONE HCL 10 MG PO TABS
10.0000 mg | ORAL_TABLET | Freq: Four times a day (QID) | ORAL | 0 refills | Status: DC | PRN
  Filled 2020-10-15: qty 60, 15d supply, fill #0

## 2020-10-30 ENCOUNTER — Other Ambulatory Visit (HOSPITAL_COMMUNITY): Payer: Self-pay

## 2020-10-30 MED ORDER — OXYCODONE HCL 10 MG PO TABS
10.0000 mg | ORAL_TABLET | Freq: Four times a day (QID) | ORAL | 0 refills | Status: DC | PRN
  Filled 2020-10-30: qty 60, 15d supply, fill #0

## 2020-11-13 ENCOUNTER — Other Ambulatory Visit (HOSPITAL_COMMUNITY): Payer: Self-pay

## 2020-11-13 MED ORDER — OXYCODONE HCL 10 MG PO TABS
10.0000 mg | ORAL_TABLET | Freq: Four times a day (QID) | ORAL | 0 refills | Status: DC | PRN
  Filled 2020-11-13: qty 60, 15d supply, fill #0

## 2020-11-26 ENCOUNTER — Other Ambulatory Visit (HOSPITAL_COMMUNITY): Payer: Self-pay

## 2020-11-26 MED ORDER — OXYCODONE HCL 10 MG PO TABS
10.0000 mg | ORAL_TABLET | Freq: Four times a day (QID) | ORAL | 0 refills | Status: DC | PRN
  Filled 2020-11-26: qty 60, 15d supply, fill #0

## 2020-12-10 ENCOUNTER — Other Ambulatory Visit (HOSPITAL_COMMUNITY): Payer: Self-pay

## 2020-12-10 MED ORDER — OXYCODONE HCL 10 MG PO TABS
10.0000 mg | ORAL_TABLET | Freq: Four times a day (QID) | ORAL | 0 refills | Status: DC | PRN
  Filled 2020-12-10: qty 60, 15d supply, fill #0

## 2020-12-24 ENCOUNTER — Other Ambulatory Visit (HOSPITAL_COMMUNITY): Payer: Self-pay

## 2020-12-24 MED ORDER — OXYCODONE HCL 10 MG PO TABS
10.0000 mg | ORAL_TABLET | Freq: Four times a day (QID) | ORAL | 0 refills | Status: DC | PRN
  Filled 2020-12-24: qty 60, 15d supply, fill #0

## 2021-01-07 ENCOUNTER — Other Ambulatory Visit (HOSPITAL_COMMUNITY): Payer: Self-pay

## 2021-01-07 MED ORDER — OXYCODONE HCL 10 MG PO TABS
10.0000 mg | ORAL_TABLET | Freq: Four times a day (QID) | ORAL | 0 refills | Status: DC | PRN
  Filled 2021-01-07: qty 60, 15d supply, fill #0

## 2021-01-08 ENCOUNTER — Other Ambulatory Visit (HOSPITAL_COMMUNITY): Payer: Self-pay

## 2021-01-22 ENCOUNTER — Other Ambulatory Visit (HOSPITAL_COMMUNITY): Payer: Self-pay

## 2021-01-22 MED ORDER — OXYCODONE HCL 10 MG PO TABS
ORAL_TABLET | ORAL | 0 refills | Status: DC
  Filled 2021-01-22: qty 60, 15d supply, fill #0

## 2021-02-04 ENCOUNTER — Other Ambulatory Visit (HOSPITAL_COMMUNITY): Payer: Self-pay

## 2021-02-04 MED ORDER — OXYCODONE HCL 10 MG PO TABS
ORAL_TABLET | ORAL | 0 refills | Status: DC
  Filled 2021-02-04: qty 60, 15d supply, fill #0

## 2021-02-14 ENCOUNTER — Other Ambulatory Visit (HOSPITAL_COMMUNITY): Payer: Self-pay

## 2021-02-14 MED ORDER — OXYCODONE HCL 10 MG PO TABS
ORAL_TABLET | ORAL | 0 refills | Status: DC
  Filled 2021-02-14: qty 60, 15d supply, fill #0

## 2021-02-17 ENCOUNTER — Other Ambulatory Visit (HOSPITAL_COMMUNITY): Payer: Self-pay

## 2021-03-01 ENCOUNTER — Other Ambulatory Visit (HOSPITAL_COMMUNITY): Payer: Self-pay

## 2021-03-01 MED ORDER — OXYCODONE HCL 10 MG PO TABS
10.0000 mg | ORAL_TABLET | ORAL | 0 refills | Status: DC
  Filled 2021-03-01: qty 60, 15d supply, fill #0

## 2021-03-04 ENCOUNTER — Other Ambulatory Visit (HOSPITAL_COMMUNITY): Payer: Self-pay

## 2021-03-17 ENCOUNTER — Other Ambulatory Visit (HOSPITAL_COMMUNITY): Payer: Self-pay

## 2021-03-17 MED ORDER — OXYCODONE HCL 10 MG PO TABS
ORAL_TABLET | ORAL | 0 refills | Status: DC
  Filled 2021-03-17 – 2021-03-18 (×2): qty 60, 15d supply, fill #0

## 2021-03-18 ENCOUNTER — Other Ambulatory Visit (HOSPITAL_COMMUNITY): Payer: Self-pay

## 2021-04-01 ENCOUNTER — Other Ambulatory Visit (HOSPITAL_COMMUNITY): Payer: Self-pay

## 2021-04-02 ENCOUNTER — Other Ambulatory Visit (HOSPITAL_COMMUNITY): Payer: Self-pay

## 2021-04-02 MED ORDER — OXYCODONE HCL 10 MG PO TABS
ORAL_TABLET | ORAL | 0 refills | Status: DC
  Filled 2021-04-02: qty 60, 15d supply, fill #0

## 2021-04-03 ENCOUNTER — Other Ambulatory Visit (HOSPITAL_COMMUNITY): Payer: Self-pay

## 2021-04-15 ENCOUNTER — Other Ambulatory Visit (HOSPITAL_COMMUNITY): Payer: Self-pay

## 2021-04-15 MED ORDER — OXYCODONE HCL 10 MG PO TABS
ORAL_TABLET | ORAL | 0 refills | Status: DC
  Filled 2021-04-15 – 2021-04-16 (×2): qty 60, 15d supply, fill #0

## 2021-04-16 ENCOUNTER — Other Ambulatory Visit (HOSPITAL_COMMUNITY): Payer: Self-pay

## 2021-04-30 ENCOUNTER — Other Ambulatory Visit (HOSPITAL_COMMUNITY): Payer: Self-pay

## 2021-04-30 MED ORDER — OXYCODONE HCL 10 MG PO TABS
ORAL_TABLET | ORAL | 0 refills | Status: DC
  Filled 2021-04-30: qty 60, 15d supply, fill #0

## 2021-05-14 ENCOUNTER — Other Ambulatory Visit (HOSPITAL_COMMUNITY): Payer: Self-pay

## 2021-05-14 MED ORDER — OXYCODONE HCL 10 MG PO TABS
ORAL_TABLET | ORAL | 0 refills | Status: DC
  Filled 2021-05-14 – 2021-08-06 (×3): qty 60, 15d supply, fill #0

## 2021-05-15 ENCOUNTER — Other Ambulatory Visit (HOSPITAL_COMMUNITY): Payer: Self-pay

## 2021-06-25 ENCOUNTER — Other Ambulatory Visit (HOSPITAL_COMMUNITY): Payer: Self-pay

## 2021-06-25 MED ORDER — OXYCODONE HCL 10 MG PO TABS
ORAL_TABLET | ORAL | 0 refills | Status: DC
  Filled 2021-06-25: qty 60, 15d supply, fill #0

## 2021-07-09 ENCOUNTER — Other Ambulatory Visit (HOSPITAL_COMMUNITY): Payer: Self-pay

## 2021-07-09 MED ORDER — OXYCODONE HCL 10 MG PO TABS
ORAL_TABLET | ORAL | 0 refills | Status: DC
  Filled 2021-07-09: qty 60, 15d supply, fill #0

## 2021-07-23 ENCOUNTER — Other Ambulatory Visit (HOSPITAL_COMMUNITY): Payer: Self-pay

## 2021-07-23 MED ORDER — OXYCODONE HCL 10 MG PO TABS
ORAL_TABLET | ORAL | 0 refills | Status: DC
  Filled 2021-07-23: qty 60, 15d supply, fill #0

## 2021-08-06 ENCOUNTER — Other Ambulatory Visit (HOSPITAL_COMMUNITY): Payer: Self-pay

## 2021-08-20 ENCOUNTER — Other Ambulatory Visit (HOSPITAL_COMMUNITY): Payer: Self-pay

## 2021-08-20 MED ORDER — OXYCODONE HCL 10 MG PO TABS
ORAL_TABLET | ORAL | 0 refills | Status: DC
Start: 2021-08-20 — End: 2021-09-03
  Filled 2021-08-20: qty 60, 15d supply, fill #0

## 2021-09-03 ENCOUNTER — Other Ambulatory Visit (HOSPITAL_COMMUNITY): Payer: Self-pay

## 2021-09-03 MED ORDER — OXYCODONE HCL 10 MG PO TABS
ORAL_TABLET | ORAL | 0 refills | Status: DC
  Filled 2021-09-03: qty 60, 15d supply, fill #0

## 2021-09-17 ENCOUNTER — Other Ambulatory Visit (HOSPITAL_COMMUNITY): Payer: Self-pay

## 2021-09-17 MED ORDER — OXYCODONE HCL 10 MG PO TABS
ORAL_TABLET | ORAL | 0 refills | Status: DC
  Filled 2021-09-17: qty 60, 15d supply, fill #0

## 2021-10-01 ENCOUNTER — Other Ambulatory Visit (HOSPITAL_COMMUNITY): Payer: Self-pay

## 2021-10-02 ENCOUNTER — Other Ambulatory Visit (HOSPITAL_COMMUNITY): Payer: Self-pay

## 2021-10-02 MED ORDER — OXYCODONE HCL 10 MG PO TABS
ORAL_TABLET | ORAL | 0 refills | Status: AC
Start: 2021-10-02 — End: ?
  Filled 2021-10-02: qty 60, 15d supply, fill #0

## 2021-10-14 ENCOUNTER — Other Ambulatory Visit (HOSPITAL_COMMUNITY): Payer: Self-pay

## 2021-10-15 ENCOUNTER — Other Ambulatory Visit (HOSPITAL_COMMUNITY): Payer: Self-pay

## 2021-10-16 ENCOUNTER — Other Ambulatory Visit (HOSPITAL_COMMUNITY): Payer: Self-pay

## 2021-10-16 MED ORDER — OXYCODONE HCL 10 MG PO TABS
ORAL_TABLET | ORAL | 0 refills | Status: DC
  Filled 2021-10-16: qty 60, 15d supply, fill #0

## 2021-10-29 ENCOUNTER — Other Ambulatory Visit (HOSPITAL_COMMUNITY): Payer: Self-pay

## 2021-10-29 MED ORDER — OXYCODONE HCL 10 MG PO TABS
ORAL_TABLET | ORAL | 0 refills | Status: DC
  Filled 2021-10-29: qty 60, 15d supply, fill #0

## 2021-10-30 ENCOUNTER — Other Ambulatory Visit (HOSPITAL_COMMUNITY): Payer: Self-pay

## 2021-11-12 ENCOUNTER — Emergency Department (HOSPITAL_COMMUNITY)
Admission: EM | Admit: 2021-11-12 | Discharge: 2021-11-13 | Disposition: A | Discharge status: Home / Self Care | Payer: Medicaid Other | Attending: Emergency Medicine | Admitting: Emergency Medicine

## 2021-11-12 ENCOUNTER — Encounter (HOSPITAL_COMMUNITY): Payer: Self-pay | Admitting: Emergency Medicine

## 2021-11-12 ENCOUNTER — Other Ambulatory Visit (HOSPITAL_COMMUNITY): Payer: Self-pay

## 2021-11-12 ENCOUNTER — Other Ambulatory Visit: Payer: Self-pay

## 2021-11-12 DIAGNOSIS — F1910 Other psychoactive substance abuse, uncomplicated: Secondary | ICD-10-CM | POA: Insufficient documentation

## 2021-11-12 DIAGNOSIS — R443 Hallucinations, unspecified: Secondary | ICD-10-CM | POA: Insufficient documentation

## 2021-11-12 DIAGNOSIS — F19939 Other psychoactive substance use, unspecified with withdrawal, unspecified: Secondary | ICD-10-CM | POA: Insufficient documentation

## 2021-11-12 DIAGNOSIS — F3160 Bipolar disorder, current episode mixed, unspecified: Secondary | ICD-10-CM | POA: Insufficient documentation

## 2021-11-12 DIAGNOSIS — F1920 Other psychoactive substance dependence, uncomplicated: Secondary | ICD-10-CM | POA: Diagnosis not present

## 2021-11-12 DIAGNOSIS — R45851 Suicidal ideations: Secondary | ICD-10-CM | POA: Insufficient documentation

## 2021-11-12 DIAGNOSIS — Z20822 Contact with and (suspected) exposure to covid-19: Secondary | ICD-10-CM | POA: Diagnosis not present

## 2021-11-12 DIAGNOSIS — Z1339 Encounter for screening examination for other mental health and behavioral disorders: Secondary | ICD-10-CM | POA: Insufficient documentation

## 2021-11-12 DIAGNOSIS — Z046 Encounter for general psychiatric examination, requested by authority: Secondary | ICD-10-CM | POA: Insufficient documentation

## 2021-11-12 DIAGNOSIS — F1914 Other psychoactive substance abuse with psychoactive substance-induced mood disorder: Secondary | ICD-10-CM | POA: Diagnosis not present

## 2021-11-12 LAB — COMPREHENSIVE METABOLIC PANEL
ALT: 12 U/L (ref 0–44)
AST: 26 U/L (ref 15–41)
Albumin: 4.5 g/dL (ref 3.5–5.0)
Alkaline Phosphatase: 67 U/L (ref 38–126)
Anion gap: 13 (ref 5–15)
BUN: 11 mg/dL (ref 6–20)
CO2: 22 mmol/L (ref 22–32)
Calcium: 10.1 mg/dL (ref 8.9–10.3)
Chloride: 102 mmol/L (ref 98–111)
Creatinine, Ser: 0.81 mg/dL (ref 0.44–1.00)
GFR, Estimated: >60 mL/min (ref >60)
Glucose, Bld: 91 mg/dL (ref 70–99)
Potassium: 4 mmol/L (ref 3.5–5.1)
Sodium: 137 mmol/L (ref 135–145)
Total Bilirubin: 0.6 mg/dL (ref 0.3–1.2)
Total Protein: 8.5 g/dL — ABNORMAL HIGH (ref 6.5–8.1)

## 2021-11-12 LAB — SALICYLATE LEVEL: Salicylate Lvl: 8.1 mg/dL (ref 7.0–30.0)

## 2021-11-12 LAB — CBC
HCT: 44.4 % (ref 36.0–46.0)
Hemoglobin: 15 g/dL (ref 12.0–15.0)
MCH: 29.9 pg (ref 26.0–34.0)
MCHC: 33.8 g/dL (ref 30.0–36.0)
MCV: 88.6 fL (ref 80.0–100.0)
Platelets: 261 10*3/uL (ref 150–400)
RBC: 5.01 MIL/uL (ref 3.87–5.11)
RDW: 12.5 % (ref 11.5–15.5)
WBC: 8.2 10*3/uL (ref 4.0–10.5)
nRBC: 0 % (ref 0.0–0.2)

## 2021-11-12 LAB — I-STAT BETA HCG BLOOD, ED (MC, WL, AP ONLY): I-stat hCG, quantitative: <5 m[IU]/mL (ref <5)

## 2021-11-12 LAB — ACETAMINOPHEN LEVEL: Acetaminophen (Tylenol), Serum: <10 ug/mL — ABNORMAL LOW (ref 10–30)

## 2021-11-12 LAB — ETHANOL: Alcohol, Ethyl (B): <10 mg/dL (ref <10)

## 2021-11-12 MED ORDER — LORAZEPAM 2 MG/ML IJ SOLN: 0.0000 mg | Freq: Four times a day (QID) | INTRAMUSCULAR | Status: DC

## 2021-11-12 MED ORDER — DIPHENHYDRAMINE HCL 50 MG/ML IJ SOLN
50.0000 mg | Freq: Once | INTRAMUSCULAR | Status: AC
  Administered 2021-11-12: 50 mg via INTRAMUSCULAR
  Filled 2021-11-12: qty 1

## 2021-11-12 MED ORDER — NICOTINE 21 MG/24HR TD PT24
21.0000 mg | MEDICATED_PATCH | Freq: Every day | TRANSDERMAL | Status: DC
  Administered 2021-11-13: 21 mg via TRANSDERMAL
  Filled 2021-11-12: qty 1

## 2021-11-12 MED ORDER — ZIPRASIDONE MESYLATE 20 MG IM SOLR
20.0000 mg | Freq: Once | INTRAMUSCULAR | Status: AC
  Administered 2021-11-12: 20 mg via INTRAMUSCULAR
  Filled 2021-11-12: qty 20

## 2021-11-12 MED ORDER — LORAZEPAM 1 MG PO TABS: 0.0000 mg | ORAL_TABLET | Freq: Two times a day (BID) | ORAL | Status: DC

## 2021-11-12 MED ORDER — OXYCODONE HCL 10 MG PO TABS
ORAL_TABLET | ORAL | 0 refills | Status: DC
Start: 2021-11-12 — End: 2022-01-27
  Filled 2021-11-12: qty 60, 15d supply, fill #0

## 2021-11-12 MED ORDER — ONDANSETRON HCL 4 MG PO TABS
4.0000 mg | ORAL_TABLET | Freq: Three times a day (TID) | ORAL | Status: DC | PRN
  Administered 2021-11-13: 4 mg via ORAL
  Filled 2021-11-12 (×2): qty 1

## 2021-11-12 MED ORDER — LORAZEPAM 2 MG/ML IJ SOLN: 0.0000 mg | Freq: Two times a day (BID) | INTRAMUSCULAR | Status: DC

## 2021-11-12 MED ORDER — LORAZEPAM 1 MG PO TABS
0.0000 mg | ORAL_TABLET | Freq: Four times a day (QID) | ORAL | Status: DC
  Administered 2021-11-13: 2 mg via ORAL
  Filled 2021-11-12: qty 2

## 2021-11-12 MED ORDER — THIAMINE HCL 100 MG/ML IJ SOLN
100.0000 mg | Freq: Every day | INTRAMUSCULAR | Status: DC
Start: 2021-11-12 — End: 2021-11-13
  Filled 2021-11-12 (×2): qty 1

## 2021-11-12 MED ORDER — LORAZEPAM 2 MG/ML IJ SOLN
2.0000 mg | Freq: Once | INTRAMUSCULAR | Status: AC
  Administered 2021-11-12: 2 mg via INTRAMUSCULAR
  Filled 2021-11-12: qty 1

## 2021-11-12 MED ORDER — ALUM & MAG HYDROXIDE-SIMETH 200-200-20 MG/5ML PO SUSP
30.0000 mL | Freq: Four times a day (QID) | ORAL | Status: DC | PRN
Start: 2021-11-12 — End: 2021-11-13

## 2021-11-12 MED ORDER — THIAMINE HCL 100 MG PO TABS
100.0000 mg | ORAL_TABLET | Freq: Every day | ORAL | Status: DC
Start: 2021-11-12 — End: 2021-11-13
  Administered 2021-11-13: 100 mg via ORAL
  Filled 2021-11-12: qty 1

## 2021-11-12 MED ORDER — STERILE WATER FOR INJECTION IJ SOLN
INTRAMUSCULAR | Status: AC
  Administered 2021-11-12: 1 mL
  Filled 2021-11-12: qty 10

## 2021-11-12 NOTE — ED Notes (Signed)
Pt is yelling, crying and trying to leave.  EDP notified and medicines ordered.

## 2021-11-12 NOTE — ED Provider Triage Note (Signed)
Emergency Medicine Provider Triage Evaluation Note  Kristina Hardy , a 51 y.o. female  was evaluated in triage.  Pt complains of SI per daughter. Under [employer] custody. 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   Review of Systems  Positive:  Negative:   Physical Exam  There were no vitals taken for this visit. Gen:   Awake, no distress   Resp:  Normal effort  MSK:   Moves extremities without difficulty  Other:    Medical Decision Making  Medically screening exam initiated at 5:02 PM.  Appropriate orders placed.  Kristina Hardy was informed that the remainder of the evaluation will be completed by another provider, this initial triage assessment does not replace that evaluation, and the importance of remaining in the ED until their evaluation is complete.     Kristina Manges, PA-C 11/12/21 1702

## 2021-11-12 NOTE — ED Provider Notes (Signed)
Emergency Department Provider Note   I have reviewed the triage vital signs and the nursing notes.   HISTORY  Chief Complaint No chief complaint on file.   HPI Kristina Hardy is a 51 y.o. female with past medical history reviewed below presents emergency department under IVC taken out by family with report of suicidal ideation, polysubstance abuse, possible hallucinations.  Patient has history of heroin as well as reported Xanax abuse.  Patient arrives to the emergency department with law enforcement after serving the IVC.  Patient describes concern that family members are putting Suboxone and Narcan into her heroin and is very upset about this.    Past Medical History:  Diagnosis Date   Anxiety    Chronic back pain    Depression    Endometriosis    Hepatitis C    Hyperthyroidism     Review of Systems  Constitutional: No fever/chills Cardiovascular: Denies chest pain. Respiratory: Denies shortness of breath. Gastrointestinal: No abdominal pain.   Musculoskeletal: Negative for back pain. Skin: Negative for rash. Neurological: Negative for headaches  ____________________________________________   PHYSICAL EXAM:  VITAL SIGNS: ED Triage Vitals  Enc Vitals Group     BP 11/12/21 1714 118/84     Pulse Rate 11/12/21 1714 93     Resp 11/12/21 1714 16     Temp 11/12/21 1714 98.3 F (36.8 C)     Temp Source 11/12/21 1714 Oral     SpO2 11/12/21 1714 99 %   Constitutional: Alert and pacing around the room.  She is shouting and throwing things. Difficult to re-direct.  Eyes: Conjunctivae are normal. Head: Atraumatic. Nose: No congestion/rhinnorhea. Mouth/Throat: Mucous membranes are moist.  Neck: No stridor.   Cardiovascular: Well perfused.  Respiratory: Normal respiratory effort.   Gastrointestinal: No distention.  Musculoskeletal: No gross deformities of extremities. Neurologic:  Normal speech and language. Ambulatory without difficulty.  Skin:  Skin is warm,  dry and intact. No rash noted.  ____________________________________________   LABS (all labs ordered are listed, but only abnormal results are displayed)  Labs Reviewed  COMPREHENSIVE METABOLIC PANEL - Abnormal; Notable for the following components:      Result Value   Total Protein 8.5 (*)    All other components within normal limits  ACETAMINOPHEN LEVEL - Abnormal; Notable for the following components:   Acetaminophen (Tylenol), Serum <10 (*)    All other components within normal limits  ETHANOL  SALICYLATE LEVEL  CBC  RAPID URINE DRUG SCREEN, HOSP PERFORMED  I-STAT BETA HCG BLOOD, ED ([facility], [facility], [facility] ONLY)    ____________________________________________   PROCEDURES  Procedure(s) performed:   .Critical Care  Performed by: Maia Plan, MD Authorized by: Maia Plan, MD   Critical care provider statement:    Critical care time (minutes):  35   Critical care time was exclusive of:  Separately billable procedures and treating other patients and teaching time   Critical care was necessary to treat or prevent imminent or life-threatening deterioration of the following conditions:  CNS failure or compromise   Critical care was time spent personally by me on the following activities:  Development of treatment plan with patient or surrogate, discussions with consultants, evaluation of patient's response to treatment, examination of patient, ordering and review of laboratory studies, ordering and review of radiographic studies, ordering and performing treatments and interventions, pulse oximetry, re-evaluation of patient's condition, review of old charts and obtaining history from patient or surrogate   I assumed direction of critical care  for this patient from another provider in my specialty: no      ____________________________________________   INITIAL IMPRESSION / ASSESSMENT AND PLAN / ED COURSE  Pertinent labs & imaging results that were available during my care of the  patient were reviewed by me and considered in my medical decision making (see chart for details).   This patient is Presenting for Evaluation of AMS, which does require a range of treatment options, and is a complaint that involves a high risk of morbidity and mortality.  The Differential Diagnoses includes but is not exclusive to alcohol, illicit or prescription medications, intracranial pathology such as stroke, intracerebral hemorrhage, fever or infectious causes including sepsis, hypoxemia, uremia, trauma, endocrine related disorders such as diabetes, hypoglycemia, thyroid-related diseases, etc.   Critical Interventions-    Medications  ondansetron (ZOFRAN) tablet 4 mg (has no administration in time range)  alum & mag hydroxide-simeth (MAALOX/MYLANTA) 200-200-20 MG/5ML suspension 30 mL (has no administration in time range)  nicotine (NICODERM CQ - dosed in mg/24 hours) patch 21 mg (has no administration in time range)  LORazepam (ATIVAN) injection 0-4 mg (has no administration in time range)    Or  LORazepam (ATIVAN) tablet 0-4 mg (has no administration in time range)  LORazepam (ATIVAN) injection 0-4 mg (has no administration in time range)    Or  LORazepam (ATIVAN) tablet 0-4 mg (has no administration in time range)  thiamine (VITAMIN B1) tablet 100 mg (has no administration in time range)    Or  thiamine (VITAMIN B1) injection 100 mg (has no administration in time range)  ziprasidone (GEODON) injection 20 mg (20 mg Intramuscular Given 11/12/21 1936)  LORazepam (ATIVAN) injection 2 mg (2 mg Intramuscular Given 11/12/21 1937)  diphenhydrAMINE (BENADRYL) injection 50 mg (50 mg Intramuscular Given 11/12/21 1937)  sterile water (preservative free) injection (1 mL  Given 11/12/21 1937)    Reassessment after intervention:  Patient calm and cooperative.    I decided to review pertinent External Data, and in summary no recent evaluations in our system with behavioral health.   Clinical  Laboratory Tests Ordered, included no leukocytosis or anemia.  No acute kidney injury.  Pregnancy test is negative.  Alcohol, salicylate, acetaminophen levels all normal.   Social Determinants of Health Risk patient is a polysubstance abuser including heroin and Xanax.   Consult complete with TTS. Appreciate psychiatric evaluation.   Medical Decision Making: Summary:  Patient presents emergency department with agitation after undergoing IVC from her family.  She is reportedly making suicidal statements and having delusional thinking.  Family concern for hallucination.  On arrival the patient is agitated and pacing.  She is shouting at staff and I am unable to verbally redirect.  She was given Geodon, Ativan, Benadryl and will be monitored pending TTS evaluation. First exam completed.   Reevaluation with update and discussion with patient. She is clam and resting in view of staff. No distress. She is medically clear to TTS evaluation. Will order CIWA monitoring and PRN medications.    Disposition: pending TTS evaluation.   ____________________________________________  FINAL CLINICAL IMPRESSION(S) / ED DIAGNOSES  Final diagnoses:  Suicidal ideation  Polysubstance abuse (HCC)     Note:  This document was prepared using Dragon voice recognition software and may include unintentional dictation errors.  Alona Bene, MD, [facility] Emergency Medicine    Feleica Fulmore, Arlyss Repress, MD 11/12/21 2139

## 2021-11-12 NOTE — ED Notes (Signed)
Pt changed into burgundy scrubs. Belongings inventoried, placed into 2 belongings bags and secured in locker #5 in purple zone. 1 valuables envelope locked up in security. Pt wanded by security.
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
## 2021-11-13 ENCOUNTER — Other Ambulatory Visit (HOSPITAL_COMMUNITY): Payer: Self-pay

## 2021-11-13 LAB — RESP PANEL BY RT-PCR (FLU A&B, COVID) ARPGX2
Influenza A by PCR: NEGATIVE
Influenza B by PCR: NEGATIVE
SARS Coronavirus 2 by RT PCR: NEGATIVE

## 2021-11-13 MED ORDER — LORAZEPAM 1 MG PO TABS
2.0000 mg | ORAL_TABLET | Freq: Once | ORAL | Status: AC
  Administered 2021-11-13: 2 mg via ORAL
  Filled 2021-11-13: qty 2

## 2021-11-13 MED ORDER — ACETAMINOPHEN 325 MG PO TABS
650.0000 mg | ORAL_TABLET | Freq: Four times a day (QID) | ORAL | Status: DC | PRN
  Administered 2021-11-13 (×2): 650 mg via ORAL
  Filled 2021-11-13 (×2): qty 2

## 2021-11-13 MED ORDER — GABAPENTIN 300 MG PO CAPS: 300.0000 mg | ORAL_CAPSULE | Freq: Three times a day (TID) | ORAL | Status: DC

## 2021-11-13 MED ORDER — BUPRENORPHINE HCL-NALOXONE HCL 8-2 MG SL SUBL
1.0000 | SUBLINGUAL_TABLET | Freq: Every day | SUBLINGUAL | Status: DC
  Administered 2021-11-13: 1 via SUBLINGUAL
  Filled 2021-11-13: qty 1

## 2021-11-13 NOTE — ED Notes (Signed)
IVC paperwork located in purple zone at this time 
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
## 2021-11-13 NOTE — ED Notes (Signed)
TTS machine in room awaiting counselor

## 2021-11-13 NOTE — ED Provider Notes (Signed)
Emergency Medicine Observation Re-evaluation Note  Kristina Hardy is a 51 y.o. female, seen on rounds today.  Pt initially presented to the ED for complaints of bizarre behavior in the setting of polysubstance use.  Patient IVC by family.  Pending recommendations by behavioral health.  Currently, the patient is resting comfortably in no acute distress.  Received Ativan as needed per CIWA protocol overnight.  Physical Exam  BP 113/82   Pulse 77   Temp 98.5 F (36.9 C) (Oral)   Resp 17   SpO2 97%  Physical Exam General: Appears to be resting comfortably in bed, no acute distress. Cardiac: Regular rate, normal heart rate, normal blood pressure for this morning's vitals. Lungs: No increased work of breathing.  Equal chest rise appreciated Psych: Calm, asleep in bed.   ED Course / MDM  EKG:   I have reviewed the labs performed to date as well as medications administered while in observation.  Recent changes in the last 24 hours include results of blood work grossly reassuring at this time.  No focal pathology appreciated.  Plan  Current plan is for behavioral health evaluation this a.m with disposition per their recommendations.  Will continue CIWA protocol given patient's long-term benzodiazepine abuse and risk for withdrawal. Kristina Hardy is under involuntary commitment.      Glyn Ade, MD 11/13/21 [phone]

## 2021-11-13 NOTE — ED Notes (Signed)
Pt lying in bed, c/o withdrawal sx of restless legs and leg cramps, sneezing, body aches, and hot and cold chills. Pt states last heroin use yesterday morning. Pt denies SI/HI and denies auditory and visual hallucinations

## 2021-11-13 NOTE — ED Notes (Signed)
Pt agitated, crying intermittently, stating she is upset and in withdrawal and that she has not peed and thinks she may be dehydrated. Explained to pt she has been medically cleared, labs from yesterday do not indicate dehydration but will let provider know. Provider came to evaluate patient and reinforced drinking PO fluids

## 2021-11-13 NOTE — BH Assessment (Signed)
@  1149, requested patient's nurse Zollie Scale, RN) to set up the TTS machine for patient's initial TTS assessment.

## 2021-11-13 NOTE — ED Notes (Signed)
This nurse called daughter per pt request who stated she will not pick up mother, that she is  supposed to be commited for 10 days and that iof she D/Cs, she wants nothing to do with her or any type of relationship with her. This nurse told pt daughter would not be coming, pt came out to make phone call to a man for a ride. This nurse heard patient whisper and then say "just come get me and we will go do what we need to do and you can drop me back off" and "just a little bit won't hurt." Notified providers that pt sounded like she was leaving to do drugs, NP confirmed sahe has been psych cleared and ready for D/C

## 2021-11-13 NOTE — ED Notes (Signed)
Clothyes given back to pt and pt changed. Cell phones in security given back to pt. Discharge instructions reviewed. Pt ambulated out of facility in stable condition with all belognings
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
## 2021-11-13 NOTE — ED Notes (Signed)
Pt currently talking with counselor via TTS

## 2021-11-13 NOTE — BH Assessment (Signed)
Pt's IVC paperwork states:  "Respondent has been previously diagnosed with depression, bipolar disorder, she is not currently on any medication for her mental health issues. Respondent abuses narcotics, principally heroin. She has expressed suicidal ideations to family. She also sees people that are not there. Family also states that she abuses prescription pills (Xanax), forgetting she takes them and then accuses family of stealing them. Family is greatly concerned for her well-being as she continues to abuse narcotics while off her medications."  Hiram Gash, daughter/petitioner No phone number provided

## 2021-11-26 ENCOUNTER — Other Ambulatory Visit: Payer: Self-pay

## 2021-11-26 MED ORDER — METHYLPREDNISOLONE 4 MG PO TBPK
ORAL_TABLET | ORAL | 0 refills | Status: DC
  Filled 2021-11-26: qty 21, 6d supply, fill #0

## 2021-11-26 MED ORDER — CEPHALEXIN 500 MG PO CAPS
ORAL_CAPSULE | ORAL | 0 refills | Status: DC
  Filled 2021-11-26: qty 14, 7d supply, fill #0

## 2021-11-26 MED ORDER — METHOCARBAMOL 500 MG PO TABS
ORAL_TABLET | ORAL | 0 refills | Status: DC
  Filled 2021-11-26: qty 20, 10d supply, fill #0

## 2021-12-01 ENCOUNTER — Other Ambulatory Visit: Payer: Self-pay

## 2021-12-02 ENCOUNTER — Other Ambulatory Visit: Payer: Self-pay

## 2022-01-09 ENCOUNTER — Telehealth (HOSPITAL_COMMUNITY): Payer: Self-pay | Admitting: Licensed Clinical Social Worker

## 2022-01-09 NOTE — Telephone Encounter (Signed)
01/09/2022 Name: Kristina Hardy MRN: [id number] DOB: [birth year]  Unsuccessful outbound call made today to assist with:   [facility] CD-IOP  Outreach Attempt:  1st Attempt  A HIPAA compliant voice message was left requesting a return call.  Un able to leave voicemail.    Dixon Boos [facility]

## 2022-02-05 ENCOUNTER — Encounter (HOSPITAL_COMMUNITY): Payer: Self-pay

## 2022-02-05 ENCOUNTER — Emergency Department (HOSPITAL_COMMUNITY)
Admission: EM | Admit: 2022-02-05 | Discharge: 2022-02-05 | Discharge status: Against Medical Advice | Payer: Medicaid Other | Attending: Emergency Medicine | Admitting: Emergency Medicine

## 2022-02-05 ENCOUNTER — Other Ambulatory Visit: Payer: Self-pay

## 2022-02-05 DIAGNOSIS — Z5321 Procedure and treatment not carried out due to patient leaving prior to being seen by health care provider: Secondary | ICD-10-CM | POA: Insufficient documentation

## 2022-02-05 DIAGNOSIS — Z0283 Encounter for blood-alcohol and blood-drug test: Secondary | ICD-10-CM | POA: Insufficient documentation
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
## 2022-02-05 NOTE — ED Provider Notes (Signed)
Patient eloped prior to my evaluation   Domenic Moras, PA-C 02/05/22 Clyde Canterbury, MD 02/06/22 1859

## 2022-02-08 ENCOUNTER — Other Ambulatory Visit: Payer: Self-pay

## 2022-02-08 ENCOUNTER — Encounter (HOSPITAL_COMMUNITY): Payer: Self-pay

## 2022-02-08 ENCOUNTER — Emergency Department (HOSPITAL_COMMUNITY)
Admission: EM | Admit: 2022-02-08 | Discharge: 2022-02-08 | Discharge status: Against Medical Advice | Payer: Self-pay | Attending: Emergency Medicine | Admitting: Emergency Medicine

## 2022-02-08 DIAGNOSIS — Z5321 Procedure and treatment not carried out due to patient leaving prior to being seen by health care provider: Secondary | ICD-10-CM | POA: Insufficient documentation

## 2022-02-08 DIAGNOSIS — R0789 Other chest pain: Secondary | ICD-10-CM | POA: Insufficient documentation

## 2022-02-08 DIAGNOSIS — R110 Nausea: Secondary | ICD-10-CM | POA: Insufficient documentation

## 2022-02-08 DIAGNOSIS — F419 Anxiety disorder, unspecified: Secondary | ICD-10-CM | POA: Insufficient documentation

## 2022-02-08 LAB — ACETAMINOPHEN LEVEL: Acetaminophen (Tylenol), Serum: <10 ug/mL — ABNORMAL LOW (ref 10–30)

## 2022-02-08 LAB — CBC
HCT: 42.2 % (ref 36.0–46.0)
Hemoglobin: 14.2 g/dL (ref 12.0–15.0)
MCH: 30.4 pg (ref 26.0–34.0)
MCHC: 33.6 g/dL (ref 30.0–36.0)
MCV: 90.4 fL (ref 80.0–100.0)
Platelets: 383 10*3/uL (ref 150–400)
RBC: 4.67 MIL/uL (ref 3.87–5.11)
RDW: 12.4 % (ref 11.5–15.5)
WBC: 9.6 10*3/uL (ref 4.0–10.5)
nRBC: 0 % (ref 0.0–0.2)

## 2022-02-08 LAB — BASIC METABOLIC PANEL
Anion gap: 13 (ref 5–15)
BUN: <5 mg/dL — ABNORMAL LOW (ref 6–20)
CO2: 23 mmol/L (ref 22–32)
Calcium: 9.9 mg/dL (ref 8.9–10.3)
Chloride: 101 mmol/L (ref 98–111)
Creatinine, Ser: 0.65 mg/dL (ref 0.44–1.00)
GFR, Estimated: >60 mL/min (ref >60)
Glucose, Bld: 128 mg/dL — ABNORMAL HIGH (ref 70–99)
Potassium: 3.6 mmol/L (ref 3.5–5.1)
Sodium: 137 mmol/L (ref 135–145)

## 2022-02-08 LAB — SALICYLATE LEVEL: Salicylate Lvl: <7 mg/dL — ABNORMAL LOW (ref 7.0–30.0)

## 2022-02-08 LAB — TROPONIN I (HIGH SENSITIVITY): Troponin I (High Sensitivity): 3 ng/L (ref <18)
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
## 2022-02-08 NOTE — ED Notes (Signed)
Pt called twice for repeat troponin, no answer.
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
## 2022-02-08 NOTE — ED Notes (Signed)
Pt called for repeat vitals x3, no response.

## 2022-03-25 ENCOUNTER — Other Ambulatory Visit (HOSPITAL_COMMUNITY): Payer: Self-pay

## 2022-05-07 ENCOUNTER — Other Ambulatory Visit: Payer: Self-pay

## 2022-05-07 ENCOUNTER — Encounter (HOSPITAL_COMMUNITY): Payer: Self-pay | Admitting: *Deleted

## 2022-05-07 ENCOUNTER — Emergency Department (HOSPITAL_COMMUNITY): Payer: Medicaid Other

## 2022-05-07 ENCOUNTER — Emergency Department (HOSPITAL_COMMUNITY)
Admission: EM | Admit: 2022-05-07 | Discharge: 2022-05-08 | Disposition: A | Discharge status: Home / Self Care | Payer: Medicaid Other | Attending: Emergency Medicine | Admitting: Emergency Medicine

## 2022-05-07 DIAGNOSIS — Z87891 Personal history of nicotine dependence: Secondary | ICD-10-CM | POA: Diagnosis not present

## 2022-05-07 DIAGNOSIS — E039 Hypothyroidism, unspecified: Secondary | ICD-10-CM | POA: Insufficient documentation

## 2022-05-07 DIAGNOSIS — M79661 Pain in right lower leg: Secondary | ICD-10-CM | POA: Insufficient documentation

## 2022-05-07 DIAGNOSIS — Z1152 Encounter for screening for COVID-19: Secondary | ICD-10-CM | POA: Diagnosis not present

## 2022-05-07 DIAGNOSIS — R0602 Shortness of breath: Secondary | ICD-10-CM | POA: Insufficient documentation

## 2022-05-07 DIAGNOSIS — J40 Bronchitis, not specified as acute or chronic: Secondary | ICD-10-CM

## 2022-05-07 DIAGNOSIS — R059 Cough, unspecified: Secondary | ICD-10-CM | POA: Diagnosis present

## 2022-05-07 LAB — RESP PANEL BY RT-PCR (RSV, FLU A&B, COVID)  RVPGX2
Influenza A by PCR: NEGATIVE
Influenza B by PCR: NEGATIVE
Resp Syncytial Virus by PCR: NEGATIVE
SARS Coronavirus 2 by RT PCR: NEGATIVE
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
## 2022-05-08 ENCOUNTER — Other Ambulatory Visit (HOSPITAL_COMMUNITY): Payer: Self-pay

## 2022-05-08 ENCOUNTER — Encounter (HOSPITAL_COMMUNITY): Payer: Self-pay

## 2022-05-08 ENCOUNTER — Emergency Department (HOSPITAL_COMMUNITY)
Admission: EM | Admit: 2022-05-08 | Discharge: 2022-05-09 | Disposition: A | Discharge status: Home / Self Care | Payer: Medicaid Other | Source: Home / Self Care | Attending: Emergency Medicine | Admitting: Emergency Medicine

## 2022-05-08 ENCOUNTER — Other Ambulatory Visit: Payer: Self-pay

## 2022-05-08 DIAGNOSIS — M79661 Pain in right lower leg: Secondary | ICD-10-CM | POA: Insufficient documentation

## 2022-05-08 DIAGNOSIS — M79604 Pain in right leg: Secondary | ICD-10-CM

## 2022-05-08 LAB — BASIC METABOLIC PANEL
Anion gap: 13 (ref 5–15)
BUN: 8 mg/dL (ref 6–20)
CO2: 24 mmol/L (ref 22–32)
Calcium: 9.3 mg/dL (ref 8.9–10.3)
Chloride: 99 mmol/L (ref 98–111)
Creatinine, Ser: 0.61 mg/dL (ref 0.44–1.00)
GFR, Estimated: >60 mL/min (ref >60)
Glucose, Bld: 108 mg/dL — ABNORMAL HIGH (ref 70–99)
Potassium: 3.5 mmol/L (ref 3.5–5.1)
Sodium: 136 mmol/L (ref 135–145)

## 2022-05-08 LAB — TROPONIN I (HIGH SENSITIVITY): Troponin I (High Sensitivity): 5 ng/L (ref <18)

## 2022-05-08 LAB — CBC
HCT: 40.7 % (ref 36.0–46.0)
Hemoglobin: 14 g/dL (ref 12.0–15.0)
MCH: 30.7 pg (ref 26.0–34.0)
MCHC: 34.4 g/dL (ref 30.0–36.0)
MCV: 89.3 fL (ref 80.0–100.0)
Platelets: 121 10*3/uL — ABNORMAL LOW (ref 150–400)
RBC: 4.56 MIL/uL (ref 3.87–5.11)
RDW: 12.1 % (ref 11.5–15.5)
WBC: 9.8 10*3/uL (ref 4.0–10.5)
nRBC: 0 % (ref 0.0–0.2)

## 2022-05-08 LAB — D-DIMER, QUANTITATIVE: D-Dimer, Quant: 0.47 ug/mL-FEU (ref 0.00–0.50)

## 2022-05-08 LAB — BRAIN NATRIURETIC PEPTIDE: B Natriuretic Peptide: 35.2 pg/mL (ref 0.0–100.0)

## 2022-05-08 MED ORDER — AZITHROMYCIN 250 MG PO TABS: ORAL_TABLET | ORAL | 0 refills | Status: DC

## 2022-05-08 MED ORDER — AZITHROMYCIN 250 MG PO TABS
ORAL_TABLET | ORAL | 0 refills | Status: DC
  Filled 2022-05-08: qty 6, 5d supply, fill #0

## 2022-05-08 MED ORDER — BENZONATATE 100 MG PO CAPS
100.0000 mg | ORAL_CAPSULE | Freq: Once | ORAL | Status: AC
  Administered 2022-05-08: 100 mg via ORAL
  Filled 2022-05-08: qty 1

## 2022-05-08 MED ORDER — ALBUTEROL SULFATE HFA 108 (90 BASE) MCG/ACT IN AERS: 2.0000 | INHALATION_SPRAY | RESPIRATORY_TRACT | Status: DC | PRN

## 2022-05-08 MED ORDER — BENZONATATE 100 MG PO CAPS
100.0000 mg | ORAL_CAPSULE | Freq: Three times a day (TID) | ORAL | 0 refills | Status: DC
  Filled 2022-05-08: qty 21, 7d supply, fill #0

## 2022-05-08 MED ORDER — PREDNISONE 20 MG PO TABS: ORAL_TABLET | ORAL | 0 refills | Status: DC

## 2022-05-08 MED ORDER — PREDNISONE 20 MG PO TABS
20.0000 mg | ORAL_TABLET | Freq: Every day | ORAL | 0 refills | Status: AC
  Filled 2022-05-08: qty 11, 5d supply, fill #0

## 2022-05-08 MED ORDER — BENZONATATE 100 MG PO CAPS: 100.0000 mg | ORAL_CAPSULE | Freq: Three times a day (TID) | ORAL | 0 refills | Status: DC
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
## 2022-05-09 NOTE — Discharge Instructions (Signed)
Ankle splint as needed. Follow up with your PCP or see orthopedics if not improving.
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
## 2022-05-26 ENCOUNTER — Telehealth (HOSPITAL_COMMUNITY): Payer: Self-pay | Admitting: Licensed Clinical Social Worker

## 2022-05-26 NOTE — Telephone Encounter (Signed)
Note in error.   Adam Phenix, [place], LCSW, [facility], [place] 05/26/2022

## 2022-05-27 ENCOUNTER — Emergency Department (HOSPITAL_COMMUNITY)
Admission: EM | Admit: 2022-05-27 | Discharge: 2022-05-27 | Discharge status: Against Medical Advice | Payer: Medicaid Other

## 2022-05-27 NOTE — ED Notes (Signed)
Attempted to bring pt back to triage. Pt states something about coffee and walked off down hall to main hospital.

## 2022-05-27 NOTE — ED Notes (Signed)
Unable to find pt in waiting room for triage.

## 2022-05-27 NOTE — ED Notes (Signed)
Pt called to be triaged. Pt stated "this is all for show and I want an attorney". Pt then stated that she was going to use the bathroom and then leave without being triaged.

## 2022-05-30 ENCOUNTER — Emergency Department (HOSPITAL_COMMUNITY)
Admission: EM | Admit: 2022-05-30 | Discharge: 2022-05-30 | Disposition: A | Discharge status: Home / Self Care | Payer: Medicaid Other | Attending: Emergency Medicine | Admitting: Emergency Medicine

## 2022-05-30 ENCOUNTER — Encounter (HOSPITAL_COMMUNITY): Payer: Self-pay | Admitting: Emergency Medicine

## 2022-05-30 ENCOUNTER — Other Ambulatory Visit: Payer: Self-pay

## 2022-05-30 DIAGNOSIS — Z008 Encounter for other general examination: Secondary | ICD-10-CM

## 2022-05-30 DIAGNOSIS — E039 Hypothyroidism, unspecified: Secondary | ICD-10-CM | POA: Insufficient documentation
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
## 2022-06-02 ENCOUNTER — Other Ambulatory Visit: Payer: Self-pay

## 2022-06-02 ENCOUNTER — Emergency Department (HOSPITAL_COMMUNITY)
Admission: EM | Admit: 2022-06-02 | Discharge: 2022-06-02 | Discharge status: Against Medical Advice | Payer: Medicaid Other | Attending: Emergency Medicine | Admitting: Emergency Medicine

## 2022-06-02 ENCOUNTER — Encounter (HOSPITAL_COMMUNITY): Payer: Self-pay | Admitting: Emergency Medicine

## 2022-06-02 ENCOUNTER — Emergency Department (HOSPITAL_COMMUNITY)
Admission: EM | Admit: 2022-06-02 | Discharge: 2022-06-02 | Discharge status: Against Medical Advice | Payer: Medicaid Other

## 2022-06-02 DIAGNOSIS — F419 Anxiety disorder, unspecified: Secondary | ICD-10-CM | POA: Diagnosis present

## 2022-06-02 DIAGNOSIS — Z5329 Procedure and treatment not carried out because of patient's decision for other reasons: Secondary | ICD-10-CM | POA: Diagnosis not present

## 2022-06-02 NOTE — ED Notes (Signed)
Patient refusing triage. States she is going to go smoke.

## 2022-06-02 NOTE — ED Provider Notes (Signed)
[place] Provider Note   CSN: [id number] Arrival date & time: 06/02/22  [id number]     History  Chief Complaint  Patient presents with   Anxiety    Kristina Hardy is a 52 y.o. female.  HPI 52 year old female with history of hepatitis C, depression, endometriosis, for thyroidism, anxiety, chronic back pain presents to the ER initially with complaints of anxiety and requesting social work consultation.  Attempting to obtain a history she states "I am so tired, I just need some sleep before I tell you my story is there is a lot to tell you and I do not want to miss anything".  She states that she has never felt so alone in her life.  She states that something happened and she is now homeless.  She has no where to go.  She denies any SI or HI.  Denies any auditory visual hallucinations.    Home Medications Prior to Admission medications   Medication Sig Start Date End Date Taking? Authorizing Provider  ALPRAZolam Duanne Moron) 1 MG tablet Take 1 mg by mouth 5 (five) times daily as needed. For anxiety.    [provider]  azithromycin (ZITHROMAX Z-PAK) 250 MG tablet 2 po day one, then 1 daily x 4 days 05/08/22   Domenic Moras, PA-C  azithromycin [facility]) 250 MG tablet Take 2 tablets by mouth on day one, then take 1 tablet daily for four days thereafter 05/08/22   Domenic Moras, PA-C  benzonatate (TESSALON) 100 MG capsule Take 1 capsule (100 mg total) by mouth every 8 (eight) hours. 05/08/22   Domenic Moras, PA-C  benzonatate (TESSALON) 100 MG capsule Take 1 capsule (100 mg total) by mouth every 8 (eight) hours. 05/08/22   Domenic Moras, PA-C  cephALEXin (KEFLEX) 500 MG capsule Take one capsule (500 mg dose) by mouth 2 (two) times daily for 7 days. 11/26/21     cloNIDine (CATAPRES) 0.2 MG tablet Take 0.5 tablets (0.1 mg total) by mouth 2 (two) times daily. 01/13/12   Teressa Lower, MD  methocarbamol (ROBAXIN) 500 MG tablet Take one tablet (500 mg dose) by mouth 2  (two) times daily for 10 days. 11/26/21     Oxycodone HCl 10 MG TABS Take 10 mg by mouth 3 (three) times daily.    [provider]  Oxycodone HCl 10 MG TABS TAKE 1 TABLET BY MOUTH 4 TIMES DAILY AS NEEDED 01/07/21     Oxycodone HCl 10 MG TABS Take 1 tablet by mouth 4 times a day as needed. 06/25/21     Oxycodone HCl 10 MG TABS Take 1 tablet by mouth 4 times a day as needed. 07/09/21     Oxycodone HCl 10 MG TABS Take 1 tablet by mouth 4 times a day as needed. 07/23/21     Oxycodone HCl 10 MG TABS Take 1 tablet by mouth 4 times a day as needed. 09/17/21     Oxycodone HCl 10 MG TABS Take 1 tablet by mouth 4 times a day as needed 10/02/21     Oxycodone HCl 10 MG TABS Take 1 tablet by mouth 4 times a day as needed. 10/16/21     predniSONE (DELTASONE) 20 MG tablet 3 tabs po day one, then 2 tabs daily x 4 days 05/08/22   Domenic Moras, PA-C  sertraline (ZOLOFT) 50 MG tablet Take 75 mg by mouth daily.    [provider]      Allergies    Klonopin [clonazepam],  Nsaids, Robaxin [methocarbamol], and Vicodin [hydrocodone-acetaminophen]    Review of Systems   Review of Systems Ten systems reviewed and are negative for acute change, except as noted in the HPI.   Physical Exam Updated Vital Signs BP (!) 114/93 (BP Location: Left Arm)   Pulse 77   Temp 98.3 F (36.8 C) (Oral)   Resp 16   LMP 11/17/2011   SpO2 99%  Physical Exam Vitals and nursing note reviewed.  Constitutional:      General: She is not in acute distress.    Appearance: She is well-developed.  HENT:     Head: Normocephalic and atraumatic.  Eyes:     Conjunctiva/sclera: Conjunctivae normal.  Cardiovascular:     Rate and Rhythm: Normal rate and regular rhythm.     Heart sounds: No murmur heard. Pulmonary:     Effort: Pulmonary effort is normal. No respiratory distress.     Breath sounds: Normal breath sounds.  Abdominal:     Palpations: Abdomen is soft.     Tenderness: There is no abdominal tenderness.   Musculoskeletal:        General: No swelling.     Cervical back: Neck supple.  Skin:    General: Skin is warm and dry.     Capillary Refill: Capillary refill takes less than 2 seconds.  Neurological:     Mental Status: She is alert.  Psychiatric:        Mood and Affect: Mood normal.     ED Results / Procedures / Treatments   Labs (all labs ordered are listed, but only abnormal results are displayed) Labs Reviewed - No data to display  EKG None  Radiology No results found.  Procedures Procedures    Medications Ordered in ED Medications - No data to display  ED Course/ Medical Decision Making/ A&P                             Medical Decision Making  52 year old female presenting with anxiety and request for social work consult.  She does not want to divulge much of her history to me at this time.  She does not appear to be responding to external stimuli, denies any SI or HI.  Does not have any medical complaints.  TOC consult placed for assistance with disposition. While awaiting TOC consult, patient eloped from the emergency department.  Final Clinical Impression(s) / ED Diagnoses Final diagnoses:  Anxiety    Rx / DC Orders ED Discharge Orders     None         Lyndel Safe 06/02/22 [place], MD 06/03/22 [phone]

## 2022-06-02 NOTE — Discharge Instructions (Signed)
[place] AT 8:00 AM-3:00 PM [place] AT 8:00 PM  [address]  [place], [place] [address] [phone]
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
## 2022-06-10 ENCOUNTER — Other Ambulatory Visit (HOSPITAL_COMMUNITY): Payer: Self-pay

## 2022-06-16 ENCOUNTER — Ambulatory Visit (HOSPITAL_COMMUNITY)
Admission: EM | Admit: 2022-06-16 | Discharge: 2022-06-16 | Disposition: A | Discharge status: Home / Self Care | Payer: Medicaid Other | Attending: Psychiatry | Admitting: Psychiatry

## 2022-06-16 DIAGNOSIS — F419 Anxiety disorder, unspecified: Secondary | ICD-10-CM | POA: Insufficient documentation

## 2022-06-16 DIAGNOSIS — F69 Unspecified disorder of adult personality and behavior: Secondary | ICD-10-CM

## 2022-06-16 NOTE — ED Notes (Signed)
Pt's IVC was rescinded by provider.
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
## 2022-06-16 NOTE — Discharge Instructions (Addendum)
Please come to [facility] (this facility, SECOND FLOOR) during walk in hours for appointment with psychiatrist/provider for further medication management and for therapists for therapy.   Walk in hours for therapy/counseling: Monday through Thursday 7:30AM until slots are full. Every Friday 12PM until slots are full.  Walk in hours for psychiatry/medication management: Monday through Friday 7:30AM until slots are full.   When you arrive please take the elevators upstairs. If you are unsure of where to go, inform the front desk that you are here for open access hours and they will assist you with directions upstairs.  Walk ins spots are limited and are seen first come, first served. YOU MAY NOT BE SEEN THE SAME DAY YOU ARRIVE. To increase the likelihood of being seen the same day, please arrive early, such as by 7:15AM.  Address:  [address], in [place], [address] Ph: [phone]   
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
## 2022-08-19 ENCOUNTER — Ambulatory Visit (HOSPITAL_COMMUNITY)
Admission: EM | Admit: 2022-08-19 | Discharge: 2022-08-19 | Disposition: A | Discharge status: Home / Self Care | Payer: Medicaid Other | Attending: Psychiatry | Admitting: Psychiatry

## 2022-08-19 DIAGNOSIS — F419 Anxiety disorder, unspecified: Secondary | ICD-10-CM | POA: Insufficient documentation

## 2022-08-19 DIAGNOSIS — Z8619 Personal history of other infectious and parasitic diseases: Secondary | ICD-10-CM | POA: Insufficient documentation

## 2022-08-19 DIAGNOSIS — G8929 Other chronic pain: Secondary | ICD-10-CM | POA: Insufficient documentation

## 2022-08-19 DIAGNOSIS — F32A Depression, unspecified: Secondary | ICD-10-CM | POA: Insufficient documentation

## 2022-08-19 DIAGNOSIS — F1910 Other psychoactive substance abuse, uncomplicated: Secondary | ICD-10-CM | POA: Insufficient documentation

## 2022-08-19 NOTE — Progress Notes (Signed)
   08/19/22 0913  [facility] Triage Screening (Walk-ins at [facility] only)  How Did You Hear About [place]? Legal System  What Is the Reason for Your Visit/Call Today? Kristina Hardy is a 52 year old female presenting to [facility] voluntarily escorted by [employer]. Per [employer] the pt was found at [facility] kids at the bus stop, and walking in and out of the road. Per [employer] the pt is homeless and has a hx of substance use. Pt is agitated and speaking erraticly. Pt reports she has has extreme anxiety and would like to receive Xanax. Pts father Kristina Hardy can be reached at [phone] for further background information.Pt denies SI/HI and AVH.  How Long Has This Been Causing You Problems? > than 6 months  Have You Recently Had Any Thoughts About Hurting Yourself? No  Are You Planning to Commit Suicide/Harm Yourself At This time? No  Have you Recently Had Thoughts About Hurting Someone [name]? No  Are You Planning To Harm Someone At This Time? No  Are you currently experiencing any auditory, visual or other hallucinations? No  Have You Used Any Alcohol or Drugs in the Past 24 Hours? No  Do you have any current medical co-morbidities that require immediate attention? No  Clinician description of patient physical appearance/behavior: erratic speak, agitation, disheveled  What Do You Feel Would Help You the Most Today? Treatment for Depression or other mood problem  If access to [facility] Urgent Care was not available, would you have sought care in the Emergency Department? No  Determination of Need Urgent (48 hours)  Options For Referral [facility] Urgent Care;Medication Management

## 2022-08-19 NOTE — Discharge Instructions (Addendum)
Please come to [facility] (this facility, SECOND FLOOR) during walk in hours for appointment with psychiatrist/provider for further medication management and for therapists for therapy.   Walk in hours for therapy/counseling: Monday through Thursday 7:30AM until slots are full. Every Friday 12PM until slots are full.  Walk in hours for psychiatry/medication management: Monday through Friday 7:30AM until slots are full.   When you arrive please take the elevators upstairs. If you are unsure of where to go, inform the front desk that you are here for open access hours and they will assist you with directions upstairs.  Walk ins spots are limited and are seen first come, first served. YOU MAY NOT BE SEEN THE SAME DAY YOU ARRIVE. To increase the likelihood of being seen the same day, please arrive early, such as by 7:15AM.  Address:  [address], SECOND FLOOR, in [place], [address] Ph: [phone]  
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
## 2022-08-24 ENCOUNTER — Emergency Department (HOSPITAL_COMMUNITY)
Admission: EM | Admit: 2022-08-24 | Discharge: 2022-08-25 | Discharge status: Against Medical Advice | Payer: Medicaid Other | Attending: Student | Admitting: Student

## 2022-08-24 ENCOUNTER — Other Ambulatory Visit: Payer: Self-pay

## 2022-08-24 ENCOUNTER — Encounter (HOSPITAL_COMMUNITY): Payer: Self-pay | Admitting: Emergency Medicine

## 2022-08-24 DIAGNOSIS — R21 Rash and other nonspecific skin eruption: Secondary | ICD-10-CM | POA: Insufficient documentation

## 2022-08-24 DIAGNOSIS — Z20822 Contact with and (suspected) exposure to covid-19: Secondary | ICD-10-CM | POA: Diagnosis not present

## 2022-08-24 DIAGNOSIS — J029 Acute pharyngitis, unspecified: Secondary | ICD-10-CM | POA: Insufficient documentation

## 2022-08-24 DIAGNOSIS — Z5321 Procedure and treatment not carried out due to patient leaving prior to being seen by health care provider: Secondary | ICD-10-CM | POA: Insufficient documentation

## 2022-08-24 LAB — SARS CORONAVIRUS 2 BY RT PCR: SARS Coronavirus 2 by RT PCR: NEGATIVE

## 2022-08-24 NOTE — ED Triage Notes (Signed)
Pt in with rash/itching to bilateral forearms and lower legs x 3 days. Pt is outside frequently, has possibly come into contact with poison oak. Rash only localized to forearms and legs. Multiple scarring r/t former IVDU hx, none currently. Also reporting sore throat and congestion

## 2022-08-25 NOTE — ED Notes (Signed)
Called name 3x no answer 

## 2022-09-09 ENCOUNTER — Emergency Department (HOSPITAL_COMMUNITY)
Admission: EM | Admit: 2022-09-09 | Discharge: 2022-09-11 | Discharge status: Against Medical Advice | Payer: Medicaid Other | Attending: Emergency Medicine | Admitting: Emergency Medicine

## 2022-09-09 DIAGNOSIS — Z5329 Procedure and treatment not carried out because of patient's decision for other reasons: Secondary | ICD-10-CM | POA: Insufficient documentation

## 2022-09-09 DIAGNOSIS — H9313 Tinnitus, bilateral: Secondary | ICD-10-CM | POA: Insufficient documentation

## 2022-09-09 DIAGNOSIS — Z1152 Encounter for screening for COVID-19: Secondary | ICD-10-CM | POA: Diagnosis not present

## 2022-09-09 DIAGNOSIS — F29 Unspecified psychosis not due to a substance or known physiological condition: Secondary | ICD-10-CM | POA: Insufficient documentation

## 2022-09-10 ENCOUNTER — Other Ambulatory Visit: Payer: Self-pay

## 2022-09-10 ENCOUNTER — Encounter (HOSPITAL_COMMUNITY): Payer: Self-pay | Admitting: Emergency Medicine

## 2022-09-10 DIAGNOSIS — F29 Unspecified psychosis not due to a substance or known physiological condition: Secondary | ICD-10-CM

## 2022-09-10 LAB — CBC
HCT: 45.2 % (ref 36.0–46.0)
Hemoglobin: 14.8 g/dL (ref 12.0–15.0)
MCH: 29.7 pg (ref 26.0–34.0)
MCHC: 32.7 g/dL (ref 30.0–36.0)
MCV: 90.6 fL (ref 80.0–100.0)
Platelets: 291 10*3/uL (ref 150–400)
RBC: 4.99 MIL/uL (ref 3.87–5.11)
RDW: 12 % (ref 11.5–15.5)
WBC: 7.4 10*3/uL (ref 4.0–10.5)
nRBC: 0 % (ref 0.0–0.2)

## 2022-09-10 LAB — COMPREHENSIVE METABOLIC PANEL
ALT: 15 U/L (ref 0–44)
AST: 27 U/L (ref 15–41)
Albumin: 3.9 g/dL (ref 3.5–5.0)
Alkaline Phosphatase: 93 U/L (ref 38–126)
Anion gap: 10 (ref 5–15)
BUN: 14 mg/dL (ref 6–20)
CO2: 24 mmol/L (ref 22–32)
Calcium: 9.5 mg/dL (ref 8.9–10.3)
Chloride: 103 mmol/L (ref 98–111)
Creatinine, Ser: 0.7 mg/dL (ref 0.44–1.00)
GFR, Estimated: >60 mL/min (ref >60)
Glucose, Bld: 98 mg/dL (ref 70–99)
Potassium: 4 mmol/L (ref 3.5–5.1)
Sodium: 137 mmol/L (ref 135–145)
Total Bilirubin: 0.5 mg/dL (ref 0.3–1.2)
Total Protein: 7.7 g/dL (ref 6.5–8.1)

## 2022-09-10 LAB — ETHANOL: Alcohol, Ethyl (B): <10 mg/dL (ref <10)

## 2022-09-10 LAB — SALICYLATE LEVEL: Salicylate Lvl: <7 mg/dL — ABNORMAL LOW (ref 7.0–30.0)

## 2022-09-10 LAB — ACETAMINOPHEN LEVEL: Acetaminophen (Tylenol), Serum: <10 ug/mL — ABNORMAL LOW (ref 10–30)

## 2022-09-10 MED ORDER — ONDANSETRON 4 MG PO TBDP
4.0000 mg | ORAL_TABLET | Freq: Once | ORAL | Status: AC
  Administered 2022-09-10: 4 mg via ORAL
  Filled 2022-09-10: qty 1

## 2022-09-10 MED ORDER — SERTRALINE HCL 50 MG PO TABS
75.0000 mg | ORAL_TABLET | Freq: Every day | ORAL | Status: DC
  Administered 2022-09-11: 75 mg via ORAL
  Filled 2022-09-10: qty 2

## 2022-09-10 MED ORDER — CLONIDINE HCL 0.1 MG PO TABS
0.1000 mg | ORAL_TABLET | Freq: Once | ORAL | Status: AC
  Administered 2022-09-10: 0.1 mg via ORAL
  Filled 2022-09-10: qty 1

## 2022-09-10 MED ORDER — ACETAMINOPHEN 325 MG PO TABS
650.0000 mg | ORAL_TABLET | Freq: Once | ORAL | Status: AC
  Administered 2022-09-10: 650 mg via ORAL
  Filled 2022-09-10: qty 2

## 2022-09-10 MED ORDER — OLANZAPINE 10 MG PO TABS
5.0000 mg | ORAL_TABLET | Freq: Every day | ORAL | Status: DC
  Administered 2022-09-10: 5 mg via ORAL
  Filled 2022-09-10: qty 1

## 2022-09-10 MED ORDER — NICOTINE 21 MG/24HR TD PT24
21.0000 mg | MEDICATED_PATCH | Freq: Every day | TRANSDERMAL | Status: DC
  Administered 2022-09-10 – 2022-09-11 (×2): 21 mg via TRANSDERMAL
  Filled 2022-09-10 (×2): qty 1

## 2022-09-10 MED ORDER — BUPRENORPHINE HCL-NALOXONE HCL 8-2 MG SL SUBL
1.0000 | SUBLINGUAL_TABLET | Freq: Once | SUBLINGUAL | Status: AC
  Administered 2022-09-10: 1 via SUBLINGUAL
  Filled 2022-09-10: qty 1

## 2022-09-10 MED ORDER — TRAZODONE HCL 50 MG PO TABS
50.0000 mg | ORAL_TABLET | Freq: Every evening | ORAL | Status: DC | PRN
  Administered 2022-09-10: 50 mg via ORAL
  Filled 2022-09-10: qty 1

## 2022-09-10 MED ORDER — TRAZODONE HCL 50 MG PO TABS: 50.0000 mg | ORAL_TABLET | Freq: Every day | ORAL | Status: DC

## 2022-09-10 NOTE — ED Notes (Signed)
Patient alert and pleasant
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
## 2022-09-10 NOTE — BH Assessment (Signed)
Clinician contacted RN Blossom Hoops to inquire on completing TTS assessment. RN stated he would work on getting patient's roomed for assessment.  Manfred Arch, MSW, LCSW Triage Specialist [phone]

## 2022-09-10 NOTE — ED Notes (Signed)
Refused EKG

## 2022-09-10 NOTE — ED Notes (Signed)
Pt is refusing labs, PA Mccauley notified.

## 2022-09-10 NOTE — Progress Notes (Signed)
LCSW Progress Note  [id number]   Antoniya Vorndran  09/10/2022  10:39 PM    Inpatient Behavioral Health Placement  Pt meets inpatient criteria per Ophelia Shoulder, NP. There are no available beds within [facility] system per [facility] [employer]. Referral was sent to the following facilities;   [facility]  Service Provider Address Phone Fax  [facility]  [address], [place] [address] [phone]  [facility]  [address] [place] [address] [phone]  [facility] [address] [place]  [address] [place], [place] [address] [phone]  [facility] [address] [place]  [address], [place] [address] [phone]  [facility] [place] [address] [phone]  [facility]  [address], [place] [address] [phone]  [facility]  [address] [place] [address] [phone]  [facility]  [address] [place] [address] [phone]  [facility]  [address] [place], [place] [address] [phone]  [facility]  [address] [place] [address] [phone]  [facility]  [address] [place] [address] [phone]  [facility]  [address], [place] [address] [phone]  [facility]  [address], [place] [address] [phone]  [facility]  [address] [place] [address] [phone]  [facility]  [address], [place] [address] [phone]  [facility]  [address], [place] [address] [phone]  [facility]  [address] [place] [address] [phone]  [facility]  [address] [place] [address] [phone]  [facility]  [address], [place] [address] [phone]  [facility]  [address], [place] [address] [phone]  [facility]  [address], [place] [address] [phone]  [facility]  [address] [place] [address] [phone]  [facility]  [address] [place] [address] [phone]  [facility]  [address] [place] [address] [phone]  [facility]  [address] [place] [address] [phone]  [facility]  [address] [place], [place] [address] [phone]  [facility]  [address] [place] [address] [phone]  [facility]  [address], [place] [address] [phone]      Situation ongoing,  CSW will follow up.    Maryjean Ka, MSW, [facility] 09/10/2022 10:39 PM
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
## 2022-09-10 NOTE — ED Notes (Signed)
Pt currently sleeping and does not want to be disturbed.

## 2022-09-10 NOTE — ED Notes (Signed)
Patient moved to room 36 in yellow zone to talk to TTS. Patient was in hallway and needed to talk in a room. Sitter is with patient in room 36.

## 2022-09-10 NOTE — ED Notes (Signed)
Pt was given peanut butter and crackers at this time

## 2022-09-10 NOTE — ED Provider Notes (Signed)
[place] EMERGENCY DEPARTMENT AT [facility] Provider Note   CSN: [id number] Arrival date & time: 09/09/22  2340     History  Chief Complaint  Patient presents with   Psychiatric Evaluation   Anxiety    Kristina Hardy is a 52 y.o. female.  Patient presents to the emergency room with a chief complaint of increased anxiety with a secondary complaint of bilateral tinnitus.  In triage patient reportedly stated that she had been hearing her "shoes talking and singing".  When asked about this she became verbally aggressive with nursing staff.  She also reported that she is hearing voices of "people downtown" that are assaulting her and being aggressive with her.  Upon my assessment she complains chiefly of anxiety which has worsened recently and of the bilateral tinnitus which has been ongoing for the past few days.  She denies SI, HI.  She denies substance abuse/use.  She states she is currently homeless.  Past medical history significant for anxiety, hepatitis C, depression, hypothyroidism  HPI     Home Medications Prior to Admission medications   Medication Sig Start Date End Date Taking? Authorizing Provider  ALPRAZolam [name]) 1 MG tablet Take 1 mg by mouth 5 (five) times daily as needed. For anxiety.    [provider]  azithromycin (ZITHROMAX Z-PAK) 250 MG tablet 2 po day one, then 1 daily x 4 days 05/08/22   Fayrene Helper, PA-C  azithromycin [facility]) 250 MG tablet Take 2 tablets by mouth on day one, then take 1 tablet daily for four days thereafter 05/08/22   Fayrene Helper, PA-C  benzonatate (TESSALON) 100 MG capsule Take 1 capsule (100 mg total) by mouth every 8 (eight) hours. 05/08/22   Fayrene Helper, PA-C  benzonatate (TESSALON) 100 MG capsule Take 1 capsule (100 mg total) by mouth every 8 (eight) hours. 05/08/22   Fayrene Helper, PA-C  cephALEXin (KEFLEX) 500 MG capsule Take one capsule (500 mg dose) by mouth 2 (two) times daily for 7 days. 11/26/21     cloNIDine (CATAPRES)  0.2 MG tablet Take 0.5 tablets (0.1 mg total) by mouth 2 (two) times daily. 01/13/12   Sunnie Nielsen, MD  methocarbamol (ROBAXIN) 500 MG tablet Take one tablet (500 mg dose) by mouth 2 (two) times daily for 10 days. 11/26/21     Oxycodone HCl 10 MG TABS Take 10 mg by mouth 3 (three) times daily.    [provider]  Oxycodone HCl 10 MG TABS TAKE 1 TABLET BY MOUTH 4 TIMES DAILY AS NEEDED 01/07/21     Oxycodone HCl 10 MG TABS Take 1 tablet by mouth 4 times a day as needed. 06/25/21     Oxycodone HCl 10 MG TABS Take 1 tablet by mouth 4 times a day as needed. 07/09/21     Oxycodone HCl 10 MG TABS Take 1 tablet by mouth 4 times a day as needed. 07/23/21     Oxycodone HCl 10 MG TABS Take 1 tablet by mouth 4 times a day as needed. 09/17/21     Oxycodone HCl 10 MG TABS Take 1 tablet by mouth 4 times a day as needed 10/02/21     Oxycodone HCl 10 MG TABS Take 1 tablet by mouth 4 times a day as needed. 10/16/21     predniSONE (DELTASONE) 20 MG tablet 3 tabs po day one, then 2 tabs daily x 4 days 05/08/22   Fayrene Helper, PA-C  sertraline (ZOLOFT) 50 MG tablet Take 75 mg by mouth  daily.    [provider]      Allergies    Klonopin [clonazepam], Nsaids, Robaxin [methocarbamol], and Vicodin [hydrocodone-acetaminophen]    Review of Systems   Review of Systems  Physical Exam Updated Vital Signs BP 113/83 (BP Location: Left Arm)   Pulse 67   Temp 97.9 F (36.6 C) (Oral)   Resp 18   Wt 46.7 kg   LMP 11/17/2011   SpO2 100%   BMI 18.84 kg/m  Physical Exam Vitals and nursing note reviewed.  Constitutional:      General: She is not in acute distress.    Appearance: She is well-developed.  HENT:     Head: Normocephalic and atraumatic.     Right Ear: Tympanic membrane normal.     Left Ear: Tympanic membrane normal.     Mouth/Throat:     Mouth: Mucous membranes are moist.  Eyes:     Conjunctiva/sclera: Conjunctivae normal.     Pupils: Pupils are equal, round, and reactive to light.   Cardiovascular:     Rate and Rhythm: Normal rate and regular rhythm.     Heart sounds: No murmur heard. Pulmonary:     Effort: Pulmonary effort is normal. No respiratory distress.     Breath sounds: Normal breath sounds.  Abdominal:     Palpations: Abdomen is soft.     Tenderness: There is no abdominal tenderness.  Musculoskeletal:        General: No swelling.     Cervical back: Neck supple.  Skin:    General: Skin is warm and dry.     Capillary Refill: Capillary refill takes less than 2 seconds.  Neurological:     Mental Status: She is alert.  Psychiatric:     Comments: Patient appears anxious     ED Results / Procedures / Treatments   Labs (all labs ordered are listed, but only abnormal results are displayed) Labs Reviewed  COMPREHENSIVE METABOLIC PANEL  ETHANOL  SALICYLATE LEVEL  ACETAMINOPHEN LEVEL  CBC  RAPID URINE DRUG SCREEN, HOSP PERFORMED    EKG None  Radiology No results found.  Procedures Procedures    Medications Ordered in ED Medications - No data to display  ED Course/ Medical Decision Making/ A&P                             Medical Decision Making Amount and/or Complexity of Data Reviewed Labs: ordered.   This patient presents to the ED for concern of tinnitus and increased anxiety, this involves an extensive number of treatment options, and is a complaint that carries with it a high risk of complications and morbidity.  The differential diagnosis includes cerumen impaction, hearing loss, otitis media, others.    Co morbidities that complicate the patient evaluation  History of anxiety   Additional history obtained:   External records from outside source obtained and reviewed including behavioral health notes from May 15 of this year.  Patient cleared at that time for outpatient treatment.  Patient reportedly taking Xanax as a street drug at that time requesting Xanax prescription and referred to [facility] behavioral  health   Lab Tests:  Patient refused any labs   Consultations Obtained:  TTS consultation was requested.  They attempted a video for evaluation of the patient and the patient was uncooperative with provider over telehealth.  They are sending in person provider to evaluate the patient.   Social Determinants of Health:  Patient has Medicaid for her primary insurance type   Test / Admission - Considered:  Patient continues to need behavioral health assessment.  Disposition pending recommendations of behavioral health team.  Patient has no complaints other than tinnitus at this time with no obvious acute cause.  Patient is voluntary at this time.  I do not see the need for involuntary commitment.  Patient denies any suicidal or homicidal ideation. Patient labs can be ordered if behavioral health decides patient meets inpatient criteria. Patient care transferred to [facility], PA-C at shift handoff.          Final Clinical Impression(s) / ED Diagnoses Final diagnoses:  None    Rx / DC Orders ED Discharge Orders     None         Pamala Duffel 09/10/22 4098    Marily Memos, MD 09/10/22 2304

## 2022-09-10 NOTE — BH Assessment (Addendum)
TO BE SEEN: The IRIS Consult Line at [phone] was contacted to initiate telepsych services for the patient. A conversation was held with IRIS Coordinator Jolyne Loa. The IRIS Care Coordinator states that patient will be seen by Federico Flake, NP @ 2215. Patient's nurse Marcello Moores, RN),  ED provider (Dr. Criss Alvine), [facility] providers Channing Mutters, NP and [place], NP, [facility] Fransico Michael, RN) provided updates via secure chat. EDP to be determined.   Following the IRIS consultation request, the [facility]) provider provided updates confirming the patient's evaluation and subsequent disposition.    IRIS Coordinator at [phone], Jolyne Loa provided updates, and or request to cancel IRIS consult.

## 2022-09-10 NOTE — ED Provider Notes (Signed)
Care transferred from Barrie Dunker, PA-C at time of sign out. See their note for full assessment.   Briefly: Patient is 52 y.o. female with history of anxiety, depression who presents to the ED with concerns for increased anxiety.      Plan: Plan per previous PA-C: TTS coming to see patient in person. If recommendations for inpatient, then TTS to IVC her for labs. Awaiting TTS recs. Voluntary.  Labs Reviewed  COMPREHENSIVE METABOLIC PANEL  ETHANOL  SALICYLATE LEVEL  ACETAMINOPHEN LEVEL  CBC  RAPID URINE DRUG SCREEN, HOSP PERFORMED    Clinical Course as of 09/10/22 1510  Thu Sep 10, 2022  1003 RN sent a message and notified that patient is requesting a dose of Suboxone.  Per patient she takes 8 mg of Suboxone twice daily.  Review of behavioral health urgent care note from 08/19/2022 that notes " She reports she has also been purchasing suboxone from the street. She states she takes 2mg  of suboxone/day."  [SB]  1345 Call to [facility] to reach out to TTS, unable to get response from TTS at this time.  [SB]  1457 PDMP reviewed.  Patient was sent of 14-day course of Suboxone on 08/27/2022 with her last day of Suboxone being today.  Patient notes that she had a follow-up appointment with her provider at [facility] today but she missed.  Instructed patient that we will only get a give her 1 dose of Suboxone here in the emergency department and she will have to get the rest prescribed to her by the provider who initially prescribed it.  Patient agreeable at this time. [SB]    Clinical Course User Index [SB] Meilin Brosh A, PA-C     Patient case discussed with Fayrene Helper, PA-C at sign-out. Plan at sign-out is pending TTS consult with disposition as per TTS, however, plans may change as per oncoming team. Patient care transferred at sign out.    This chart was dictated using voice recognition software, Dragon. Despite the best efforts of this provider to proofread and correct errors, errors may still  occur which can change documentation meaning.   Miabella Shannahan A, PA-C 09/10/22 1511    Rexford Maus, DO 09/10/22 1536

## 2022-09-10 NOTE — ED Notes (Signed)
Pt awake and cooperative with this NT. Vitals taken, gave [place] sandwich. Pt comfortable at this time.

## 2022-09-10 NOTE — ED Notes (Addendum)
TTS attempted. LCSW noted that pt uncooperative and will be sending someone in person to perform. Pt is still refusing labs.

## 2022-09-10 NOTE — Consult Note (Signed)
Telepsych Consultation   Reason for Consult:  "anxiety, possible auditory hallucinations." Referring Physician:  Cyndee Hardy, Kristina A, PA-C  Location of Patient:    [facility] Location of Provider: Other: virtual home office  Patient Identification: Kristina Hardy MRN:  [id number] Principal Diagnosis: Psychosis (HCC) Diagnosis:  Principal Problem:   Psychosis (HCC)   Total Time spent with patient: 30 minutes  Subjective:   Kristina Hardy is Hardy 52 y.o. female patient admitted with  Per RN Triage Note: "Pt in with anxiety and bilateral ringing of ears. Pt aggressive in triage, states she has been hearing her shoes talking and singing. When asked what the voices are saying, she then threw shoes at this RN, shouting, "do you see these shoes?! We see the same things! How dare you say I'm hearing voices!". States "they" are the "people downtown" that are assaulting her and being aggressive with her. States she wants to see her family in [place], began to cry. Denies SI or HI, states she cannot live with "them" anymore. Lives at [facility] occasionally, otherwise homeless. Denies any substance use"  HPI:   Patient seen via telepsych by this provider; chart reviewed and consulted with Dr. Lucianne Hardy on 09/10/22.  On evaluation Kristina Hardy is seen standing in the exam room, dis shelved appearance.   Pt heard whispering to herself, that she is waiting for Hardy chair to to sit in so she can talk.  Pt greeted by this Clinical research associate and anticipatory guidance provided.  Pt is alert and oriented to self only.  She spontaneously begins telling her story and then abruptly stops, starts to cry loudly and then states she does  not feel comfortable with the door being slightly ajar.    Pt is very talkative; her speech is pressured and it's difficult to interrupt her for clarification.  She states, "they keep staying I am sucidal but that is not true." Patient reports she loves life and wants to live.  She reports her son's aunt told her  that her children want her dead 3 weeks ago.   Patient crying loudly, and asks, "why do my kids want me dead?" When asked what brought her to the emergency department she reports she was led here.  She is very disorganized and Hardy poor historian.  Her responses to questions are tangenital, when trying to answer questions she frequently digresses to unrelated stories.  She eventually responds by saying cartoons in bushes that resemble her family members brought her to the hospital.    During evaluation Kristina Hardy is in exam room sitting in Hardy chair; she is alert/oriented x 2; appears anxious, sad, dysphoric but tries to cooperate; and mood congruent with affect.  Patient speech is pressured; she makes good eye eye contact.  Her thought process is irrelevant and illogical; she appears to be responding to internal stimulus; Patient thought content is delusional and her behaviors are of paranoia.  Patient denies suicidal/self-harm/homicidal ideation.   Patient has attempted to cooperative with assessment but is limited by her mental condition.     Per ED Provider Admission Assessment 09/09/2022@2340  Chief Complaint  Patient presents with   Psychiatric Evaluation   Anxiety      Kristina Hardy is Hardy 52 y.o. female.  Patient presents to the emergency room with Hardy chief complaint of increased anxiety with Hardy secondary complaint of bilateral tinnitus.  In triage patient reportedly stated that she had been hearing her "shoes talking and singing".  When asked about this she became verbally  aggressive with nursing staff.  She also reported that she is hearing voices of "people downtown" that are assaulting her and being aggressive with her.  Upon my assessment she complains chiefly of anxiety which has worsened recently and of the bilateral tinnitus which has been ongoing for the past few days.  She denies SI, HI.  She denies substance abuse/use.  She states she is currently homeless.  Past medical history significant  for anxiety, hepatitis C, depression, hypothyroidism  Past Psychiatric History: limited hx as outlined above  Risk to Self:  yes Risk to Others:  no Prior Inpatient Therapy: denies  Prior Outpatient Therapy:  denies  Past Medical History:  Past Medical History:  Diagnosis Date   Anxiety    Chronic back pain    Depression    Endometriosis    Hepatitis C    Hyperthyroidism     Past Surgical History:  Procedure Laterality Date   RHINOPLASTY     TONSILLECTOMY     Family History: History reviewed. No pertinent family history. Family Psychiatric  History: deferred Social History:  Social History   Substance and Sexual Activity  Alcohol Use No     Social History   Substance and Sexual Activity  Drug Use No   Comment: hx of opiod abuse    Social History   Socioeconomic History   Marital status: Divorced    Spouse name: Not on file   Number of children: Not on file   Years of education: Not on file   Highest education level: Not on file  Occupational History   Not on file  Tobacco Use   Smoking status: Every Day    Packs/day: 1    Types: Cigarettes   Smokeless tobacco: Not on file  Substance and Sexual Activity   Alcohol use: No   Drug use: No    Comment: hx of opiod abuse   Sexual activity: Not on file  Other Topics Concern   Not on file  Social History Narrative   ** Merged History Encounter **       Social Determinants of Health   Financial Resource Strain: Not on file  Food Insecurity: Not on file  Transportation Needs: Not on file  Physical Activity: Not on file  Stress: Not on file  Social Connections: Not on file   Additional Social History:    Allergies:   Allergies  Allergen Reactions   Klonopin [Clonazepam] Other (See Comments)    headache   Nsaids     GI Upset   Robaxin [Methocarbamol]    Vicodin [Hydrocodone-Acetaminophen] Nausea And Vomiting and Rash    Labs:  Results for orders placed or performed during the hospital  encounter of 09/09/22 (from the past 48 hour(s))  Comprehensive metabolic panel     Status: None   Collection Time: 09/10/22  2:15 PM  Result Value Ref Range   Sodium 137 135 - 145 mmol/L   Potassium 4.0 3.5 - 5.1 mmol/L   Chloride 103 98 - 111 mmol/L   CO2 24 22 - 32 mmol/L   Glucose, Bld 98 70 - 99 mg/dL    Comment: Glucose reference range applies only to samples taken after fasting for at least 8 hours.   BUN 14 6 - 20 mg/dL   Creatinine, Ser 1.61 0.44 - 1.00 mg/dL   Calcium 9.5 8.9 - 09.6 mg/dL   Total Protein 7.7 6.5 - 8.1 g/dL   Albumin 3.9 3.5 - 5.0 g/dL   AST 27  15 - 41 U/L   ALT 15 0 - 44 U/L   Alkaline Phosphatase 93 38 - 126 U/L   Total Bilirubin 0.5 0.3 - 1.2 mg/dL   GFR, Estimated >78 >29 mL/min    Comment: (NOTE) Calculated using the CKD-EPI Creatinine Equation (2021)    Anion gap 10 5 - 15    Comment: Performed at [facility], [address], [place], [place] [address]  Ethanol     Status: None   Collection Time: 09/10/22  2:15 PM  Result Value Ref Range   Alcohol, Ethyl (B) <10 <10 mg/dL    Comment: (NOTE) Lowest detectable limit for serum alcohol is 10 mg/dL.  For medical purposes only. Performed at [facility], [address] [place], [place] [address]   Salicylate level     Status: Abnormal   Collection Time: 09/10/22  2:15 PM  Result Value Ref Range   Salicylate Lvl <7.0 (L) 7.0 - 30.0 mg/dL    Comment: Performed at [facility], [address] [place], [place] [address]  Acetaminophen level     Status: Abnormal   Collection Time: 09/10/22  2:15 PM  Result Value Ref Range   Acetaminophen (Tylenol), Serum <10 (L) 10 - 30 ug/mL    Comment: (NOTE) Therapeutic concentrations vary significantly. Hardy range of 10-30 ug/mL  may be an effective concentration for many patients. However, some  are best treated at concentrations outside of this range. Acetaminophen concentrations >150 ug/mL at 4 hours after ingestion  and >50 ug/mL at 12  hours after ingestion are often associated with  toxic reactions.  Performed at [facility], [address] [place], [place] [address]   cbc     Status: None   Collection Time: 09/10/22  2:15 PM  Result Value Ref Range   WBC 7.4 4.0 - 10.5 K/uL   RBC 4.99 3.87 - 5.11 MIL/uL   Hemoglobin 14.8 12.0 - 15.0 g/dL   HCT 41.3 24.4 - 01.0 %   MCV 90.6 80.0 - 100.0 fL   MCH 29.7 26.0 - 34.0 pg   MCHC 32.7 30.0 - 36.0 g/dL   RDW 27.2 53.6 - 64.4 %   Platelets 291 150 - 400 K/uL   nRBC 0.0 0.0 - 0.2 %    Comment: Performed at [facility], [address], [place], [place] [address]    Medications:  Current Facility-Administered Medications  Medication Dose Route Frequency Provider Last Rate Last Admin   nicotine (NICODERM CQ - dosed in mg/24 hours) patch 21 mg  21 mg Transdermal Daily Fayrene Helper, PA-C   21 mg at 09/10/22 1936   Current Outpatient Medications  Medication Sig Dispense Refill   ALPRAZolam (XANAX) 1 MG tablet Take 1 mg by mouth 5 (five) times daily as needed. For anxiety.     azithromycin (ZITHROMAX Z-PAK) 250 MG tablet 2 po day one, then 1 daily x 4 days 5 tablet 0   azithromycin (ZITHROMAX) 250 MG tablet Take 2 tablets by mouth on day one, then take 1 tablet daily for four days thereafter 6 tablet 0   benzonatate (TESSALON) 100 MG capsule Take 1 capsule (100 mg total) by mouth every 8 (eight) hours. 21 capsule 0   benzonatate (TESSALON) 100 MG capsule Take 1 capsule (100 mg total) by mouth every 8 (eight) hours. 21 capsule 0   cephALEXin (KEFLEX) 500 MG capsule Take one capsule (500 mg dose) by mouth 2 (two)  times daily for 7 days. 14 capsule 0   cloNIDine (CATAPRES) 0.2 MG tablet Take 0.5 tablets (0.1 mg total) by mouth 2 (two) times daily. 20 tablet 0   methocarbamol (ROBAXIN) 500 MG tablet Take one tablet (500 mg dose) by mouth 2 (two) times daily for 10 days. 20 tablet 0   Oxycodone HCl 10 MG TABS Take 10 mg by mouth 3 (three) times daily.     Oxycodone  HCl 10 MG TABS TAKE 1 TABLET BY MOUTH 4 TIMES DAILY AS NEEDED 60 tablet 0   Oxycodone HCl 10 MG TABS Take 1 tablet by mouth 4 times Hardy day as needed. 60 tablet 0   Oxycodone HCl 10 MG TABS Take 1 tablet by mouth 4 times Hardy day as needed. 60 tablet 0   Oxycodone HCl 10 MG TABS Take 1 tablet by mouth 4 times Hardy day as needed. 60 tablet 0   Oxycodone HCl 10 MG TABS Take 1 tablet by mouth 4 times Hardy day as needed. 60 tablet 0   Oxycodone HCl 10 MG TABS Take 1 tablet by mouth 4 times Hardy day as needed 60 tablet 0   Oxycodone HCl 10 MG TABS Take 1 tablet by mouth 4 times Hardy day as needed. 60 tablet 0   predniSONE (DELTASONE) 20 MG tablet 3 tabs po day one, then 2 tabs daily x 4 days 11 tablet 0   sertraline (ZOLOFT) 50 MG tablet Take 75 mg by mouth daily.      Musculoskeletal: pt seen moving all extremities and ambulating independently in exam room without concerns.  Strength & Muscle Tone: within normal limits Gait & Station: normal Patient leans: N/Hardy    Psychiatric Specialty Exam:  Presentation  General Appearance:  Bizarre (pt seen standing in the corner, repeats to herself, "I'm waiting for Hardy chair to sit down.")  Eye Contact: Fair  Speech: Pressured  Speech Volume: Normal  Handedness: Right   Mood and Affect  Mood: Anxious; Dysphoric; Labile  Affect: Congruent; Tearful; Labile   Thought Process  Thought Processes: Disorganized; Irrevelant  Descriptions of Associations:Tangential  Orientation:Partial  Thought Content:Illogical; Paranoid Ideation; Tangential; Scattered  History of Schizophrenia/Schizoaffective disorder:No  Duration of Psychotic Symptoms:N/Hardy  Hallucinations:Hallucinations: Auditory; Visual Description of Auditory Hallucinations: pt reports seeing cartoon images of her family members in the bushes telling her to come near Description of Visual Hallucinations: pt reports seeing cartoon images of her family members in the bushes telling her to come  near  Ideas of Reference:Delusions; Paranoia  Suicidal Thoughts:Suicidal Thoughts: No  Homicidal Thoughts:Homicidal Thoughts: No   Sensorium  Memory: Immediate Poor; Recent Poor; Remote Poor  Judgment: Poor  Insight: Poor   Executive Functions  Concentration: Poor  Attention Span: Poor  Recall: Poor  Fund of Knowledge: Poor  Language: Fair   Psychomotor Activity  Psychomotor Activity:Psychomotor Activity: Increased   Assets  Assets: Communication Skills; Desire for Improvement; Financial Resources/Insurance   Sleep  Sleep:Sleep: Poor Number of Hours of Sleep: 4    Physical Exam: Physical Exam Vitals and nursing note reviewed.  Cardiovascular:     Rate and Rhythm: Normal rate.     Pulses: Normal pulses.  Pulmonary:     Effort: Pulmonary effort is normal.  Musculoskeletal:        General: Normal range of motion.     Cervical back: Normal range of motion.  Neurological:     Mental Status: She is alert. She is disoriented.  Psychiatric:  Attention and Perception: She perceives auditory (denies command in nature) and visual hallucinations.        Mood and Affect: Mood is anxious. Affect is labile, blunt and tearful.        Speech: Speech is rapid and pressured.        Behavior: Behavior is hyperactive. Behavior is cooperative.        Thought Content: Thought content is paranoid and delusional. Thought content does not include homicidal or suicidal ideation. Thought content does not include homicidal or suicidal plan.        Cognition and Memory: Cognition and memory normal.        Judgment: Judgment is impulsive and inappropriate.    Review of Systems  Constitutional: Negative.   HENT: Negative.    Eyes: Negative.   Respiratory: Negative.    Cardiovascular: Negative.   Gastrointestinal: Negative.   Genitourinary: Negative.   Musculoskeletal: Negative.   Skin: Negative.   Neurological: Negative.   Endo/Heme/Allergies: Negative.    Psychiatric/Behavioral:  Positive for hallucinations. 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 The patient is nervous/anxious and has insomnia.    Blood pressure 125/81, pulse 81, temperature 98.3 F (36.8 C), temperature source Oral, resp. rate 19, weight 46.7 kg, last menstrual period 11/17/2011, SpO2 99 %. Body mass index is 18.84 kg/m.  Treatment Plan Summary: Pt voluntarily presents for anxiety and psychiatric evaluation.  On assessment she is disorganized, endorses AVH, is delusional and paranoid and unable to render Hardy adequate history.  She reports Hardy hx of heroine abuse but reports she's been sober for Hardy few years.  She denies illicit drug usage or alcohol use.  Her UDS is pending. She reports she is homeless and currently lives at the [facility]. There is limited history available for patient but she does appear mentally decompensated and not at baseline.  From my interaction with her, she is impulsive and lacks insight, lacks capacity to make good decision.  Considering this, she is referred for inpatient psychiatric admission where she can be restarted on psychotropic medications and monitored for safety and mood stability. This was discussed with patient who agrees.   Daily contact with patient to assess and evaluate symptoms and progress in treatment and Medication management  She will need Hardy baseline EKG to rule out prolonged QT intervals Plan to restart home medications.  Disposition: Recommend psychiatric Inpatient admission when medically cleared.  This service was provided via telemedicine using Hardy 2-way, interactive audio and video technology.  Names of all persons participating in this telemedicine service and their role in this encounter. Name: Pama Faro Role: Patient  Name: Ophelia Shoulder Role: PMHNP  Name: Nelly Rout Role: Psychiatirst    Chales Abrahams, NP 09/10/2022 9:16 PM

## 2022-09-10 NOTE — ED Provider Notes (Signed)
   7:12 PM TTS and psychiatry has evaluated patient and deemed patient meets inpatient criteria for admission due to being floridly psychotic.  EKG ordered to assess for QT interval.   Date: 09/10/2022  Rate: 57  Rhythm: sinus bradycardia  QRS Axis: normal  Intervals: normal  ST/T Wave abnormalities: normal  Conduction Disutrbances: none  Narrative Interpretation: no prolonged QT  Old EKG Reviewed: No significant changes noted EKG viewed and interpreted by me  -Labs ordered, independently viewed and interpreted by me.  Labs remarkable for reassuring electrolytes panel -The patient was maintained on a cardiac monitor.  I personally viewed and interpreted the cardiac monitored which showed an underlying rhythm of: sinus bradycardia, no prolonged QT -Imaging including head CT considered but not performed -This patient presents to the ED for concern of altered mental status, this involves an extensive number of treatment options, and is a complaint that carries with it a high risk of complications and morbidity.  The differential diagnosis includes psychosis, depression, psychiatric illness, metabolic derangement -Co morbidities that complicate the patient evaluation includes hepatitis, depression, anxiety -Treatment includes supportive care along with xuboxone -Reevaluation of the patient after these medicines showed that the patient improved -PCP office notes or outside notes reviewed -Discussion with specialist TTS and psychiatry -Escalation to admission/observation considered: patient amenable for psych admission  BP 125/81 (BP Location: Right Arm)   Pulse 81   Temp 98.3 F (36.8 C) (Oral)   Resp 19   Wt 46.7 kg   LMP 11/17/2011   SpO2 99%   BMI 18.84 kg/m   Results for orders placed or performed during the hospital encounter of 09/09/22  Comprehensive metabolic panel  Result Value Ref Range   Sodium 137 135 - 145 mmol/L   Potassium 4.0 3.5 - 5.1 mmol/L   Chloride 103 98 -  111 mmol/L   CO2 24 22 - 32 mmol/L   Glucose, Bld 98 70 - 99 mg/dL   BUN 14 6 - 20 mg/dL   Creatinine, Ser 5.78 0.44 - 1.00 mg/dL   Calcium 9.5 8.9 - 46.9 mg/dL   Total Protein 7.7 6.5 - 8.1 g/dL   Albumin 3.9 3.5 - 5.0 g/dL   AST 27 15 - 41 U/L   ALT 15 0 - 44 U/L   Alkaline Phosphatase 93 38 - 126 U/L   Total Bilirubin 0.5 0.3 - 1.2 mg/dL   GFR, Estimated >62 >95 mL/min   Anion gap 10 5 - 15  Ethanol  Result Value Ref Range   Alcohol, Ethyl (B) <10 <10 mg/dL  Salicylate level  Result Value Ref Range   Salicylate Lvl <7.0 (L) 7.0 - 30.0 mg/dL  Acetaminophen level  Result Value Ref Range   Acetaminophen (Tylenol), Serum <10 (L) 10 - 30 ug/mL  cbc  Result Value Ref Range   WBC 7.4 4.0 - 10.5 K/uL   RBC 4.99 3.87 - 5.11 MIL/uL   Hemoglobin 14.8 12.0 - 15.0 g/dL   HCT 28.4 13.2 - 44.0 %   MCV 90.6 80.0 - 100.0 fL   MCH 29.7 26.0 - 34.0 pg   MCHC 32.7 30.0 - 36.0 g/dL   RDW 10.2 72.5 - 36.6 %   Platelets 291 150 - 400 K/uL   nRBC 0.0 0.0 - 0.2 %   No results found.      Fayrene Helper, PA-C 09/10/22 2027    Pricilla Loveless, MD 09/10/22 [phone]

## 2022-09-10 NOTE — ED Notes (Signed)
Nurse woke patient up to ask if we could obtain the blood work that the MD had ordered yesterday. Patient refuses and told nurse to let her sleep. Will reassess at another time. Unable to obtain vitals at this time due to patient refusal.

## 2022-09-11 LAB — RAPID URINE DRUG SCREEN, HOSP PERFORMED
Amphetamines: POSITIVE — AB
Barbiturates: NOT DETECTED
Benzodiazepines: NOT DETECTED
Cocaine: NOT DETECTED
Opiates: NOT DETECTED
Tetrahydrocannabinol: NOT DETECTED

## 2022-09-11 LAB — SARS CORONAVIRUS 2 BY RT PCR: SARS Coronavirus 2 by RT PCR: NEGATIVE

## 2022-09-11 MED ORDER — BUPRENORPHINE HCL-NALOXONE HCL 8-2 MG SL SUBL
1.0000 | SUBLINGUAL_TABLET | Freq: Every day | SUBLINGUAL | Status: DC
  Administered 2022-09-11: 1 via SUBLINGUAL
  Filled 2022-09-11: qty 1

## 2022-09-11 NOTE — ED Notes (Signed)
When attempting to start paperwork to get patient transferred to [facility], patient got very upset and started crying. She asked nurse and sitter where [facility] Bussing was. I looked it up and told her 3 hours away. Patient states that she can not go that far away and that no one told her she was getting placed somewhere else. She states that she has stuff at the [facility] and has kids and needs to get things situated first. She states she was not told that she was going to be placed somewhere outside of the hospital. She was very upset. She states she wanted to leave. I attempted to contact psyh and have them call me in regards to patient. No one called me or messaged me back on patient. I went to go talk to an ED doc to see what I could do. Since she is not IVC'd we can not hold her. I explained to patient that since she is not her involuntary then we can not force her to stay. She states ok and got up out of bed and got dressed. She states she is leaving and going back home. Attempted again to contact NP, but patietn states that she is leaving and walked out of ED at 4:20pm. Will update NP from [facility]h about what had occurred and that patient left AMA from ED.

## 2022-09-11 NOTE — Progress Notes (Signed)
1:53 PM - CSW spoke with Donnal Debar, intake coordinator, at [facility], via phone call. Randi reports that pt has been accepted to [facility] today, pending UDS and negative covid results. CSW will fax results to [facility] once they have resulted. CSW notified RN and NP.  Cathie Beams, LCSW  09/11/2022 1:55 PM

## 2022-09-11 NOTE — ED Notes (Signed)
Patient states that she feels calm. Denies SI and HI at this time.

## 2022-09-11 NOTE — Progress Notes (Signed)
LCSW Progress Note  [id number]   Kristina Hardy  09/11/2022  9:24 AM  Description:   Inpatient Psychiatric Referral  Patient was recommended inpatient per Ophelia Shoulder, NP. There are no available beds at [facility], per [facility], RN. Patient was referred to the following out of network facilities:   Destination  Service Provider Address Phone Fax  [facility]  [address] [place] [address] [phone]  [facility]  [address] [place] [address] [phone]  [facility] [address] [place]  [address] [place], [place] [address] [phone]  [facility] [address] [place]  [address], [place] [address] [phone]  [facility] [place] [address] [phone]  [facility]  [address] [place] [address] [phone]  [facility]  [address] [place] [address] [phone]  [facility]  [address] [place] [address] [phone]  [facility]  [address] [place], [place] [address] [phone]  [facility]  [address] [place] [address] [phone]  [facility]  [address] [place] [address] [phone]  [facility]  [address], [place] [address] [phone]  [facility]  [address], [place] [address] [phone]  [facility]  [address], [place] [address] [phone]  [facility]  [address], [place] [address] [phone]  [facility]  [address] [place] [address]  [phone]  [facility]  [address] [place] [address] [phone]  [facility]  [address] [place] [place] [address] [phone]  [facility]  [address], [place] [address] [phone]  [facility]  [address] [place] [address] [phone]  [facility]  [address] [place] [address] [phone]  [facility]  [address] [place] [address] [phone]  [facility]  [address], [place] [address] [phone]  [facility]  [address] [place] [address] [phone]  [facility]  [address] [place] [address] [phone]  [facility]  [address] [place], [place] [address] [phone]  [facility]  [address] [place] [address] [phone]  [facility]  [address] [place] [address] [phone]    Situation ongoing, CSW to continue following and update chart as more information becomes available.      Cathie Beams, LCSW  09/11/2022 9:24 AM

## 2022-09-11 NOTE — Progress Notes (Addendum)
Pt was accepted to [facility] TODAY 09/11/2022  Pt meets inpatient criteria per Ophelia Shoulder, NP  Attending Physician will be Luberta Mutter, MD  Report can be called to: [phone]  Bed is ready now  Care Team Notified: Ophelia Shoulder, NP, April Smith, RN, and [place], LCSWA  [place], [place]  09/11/2022 3:37 PM

## 2022-09-17 ENCOUNTER — Ambulatory Visit (HOSPITAL_COMMUNITY)
Admission: EM | Admit: 2022-09-17 | Discharge: 2022-09-17 | Disposition: A | Discharge status: Home / Self Care | Payer: Medicaid Other

## 2022-09-17 NOTE — ED Notes (Signed)
Patient left ama and front desk asked nursing to take out the system.

## 2022-09-17 NOTE — BH Assessment (Signed)
@  1557, Kristina Hardy approached the [facility] seeking services. After completing the registration, she was asked to undergo a security check. However, she declined to let security lock up her personal belongings. When urged to reconsider, she insisted that if she had to secure her items, she wouldn't seek services at this facility. Despite encouragement to stay, she remained adamant that she no longer wanted services and left the building. As a result, the clinician was unable to meet with her to complete the Waiver of Medical Screening or Triage.

## 2022-10-18 ENCOUNTER — Encounter (HOSPITAL_COMMUNITY): Payer: Self-pay | Admitting: *Deleted

## 2022-10-18 ENCOUNTER — Other Ambulatory Visit: Payer: Self-pay

## 2022-10-18 ENCOUNTER — Emergency Department (HOSPITAL_COMMUNITY)
Admission: EM | Admit: 2022-10-18 | Discharge: 2022-10-18 | Disposition: A | Discharge status: Home / Self Care | Payer: Medicaid Other | Attending: Emergency Medicine | Admitting: Emergency Medicine

## 2022-10-18 DIAGNOSIS — F32A Depression, unspecified: Secondary | ICD-10-CM | POA: Diagnosis not present

## 2022-10-18 DIAGNOSIS — F22 Delusional disorders: Secondary | ICD-10-CM | POA: Diagnosis not present

## 2022-10-18 LAB — COMPREHENSIVE METABOLIC PANEL
ALT: 14 U/L (ref 0–44)
AST: 25 U/L (ref 15–41)
Albumin: 3.7 g/dL (ref 3.5–5.0)
Alkaline Phosphatase: 73 U/L (ref 38–126)
Anion gap: 9 (ref 5–15)
BUN: 21 mg/dL — ABNORMAL HIGH (ref 6–20)
CO2: 24 mmol/L (ref 22–32)
Calcium: 9.2 mg/dL (ref 8.9–10.3)
Chloride: 102 mmol/L (ref 98–111)
Creatinine, Ser: 0.76 mg/dL (ref 0.44–1.00)
GFR, Estimated: >60 mL/min (ref >60)
Glucose, Bld: 99 mg/dL (ref 70–99)
Potassium: 3.4 mmol/L — ABNORMAL LOW (ref 3.5–5.1)
Sodium: 135 mmol/L (ref 135–145)
Total Bilirubin: 0.3 mg/dL (ref 0.3–1.2)
Total Protein: 6.5 g/dL (ref 6.5–8.1)

## 2022-10-18 LAB — ACETAMINOPHEN LEVEL: Acetaminophen (Tylenol), Serum: <10 ug/mL — ABNORMAL LOW (ref 10–30)

## 2022-10-18 LAB — CBC
HCT: 36.2 % (ref 36.0–46.0)
Hemoglobin: 12 g/dL (ref 12.0–15.0)
MCH: 30.2 pg (ref 26.0–34.0)
MCHC: 33.1 g/dL (ref 30.0–36.0)
MCV: 91 fL (ref 80.0–100.0)
Platelets: 268 10*3/uL (ref 150–400)
RBC: 3.98 MIL/uL (ref 3.87–5.11)
RDW: 11.9 % (ref 11.5–15.5)
WBC: 8 10*3/uL (ref 4.0–10.5)
nRBC: 0 % (ref 0.0–0.2)

## 2022-10-18 LAB — ETHANOL: Alcohol, Ethyl (B): <10 mg/dL (ref <10)

## 2022-10-18 LAB — SALICYLATE LEVEL: Salicylate Lvl: <7 mg/dL — ABNORMAL LOW (ref 7.0–30.0)

## 2022-10-18 MED ORDER — NICOTINE 21 MG/24HR TD PT24
21.0000 mg | MEDICATED_PATCH | Freq: Every day | TRANSDERMAL | Status: DC
  Administered 2022-10-18: 21 mg via TRANSDERMAL
  Filled 2022-10-18: qty 1

## 2022-10-18 MED ORDER — LORAZEPAM 1 MG PO TABS
1.0000 mg | ORAL_TABLET | Freq: Once | ORAL | Status: AC
  Administered 2022-10-18: 1 mg via ORAL
  Filled 2022-10-18: qty 1
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
## 2022-10-18 NOTE — BH Assessment (Signed)
Contacted RN to arrange tele-assessment. Pt has left the ED.   Pamalee Leyden, [facility], [facility] Triage Specialist

## 2022-10-18 NOTE — ED Notes (Signed)
The pt is crying and talking constantly she denies si and hi

## 2022-10-18 NOTE — ED Triage Notes (Signed)
The pt is depressed for 9 months   she is crying at present stating that she does not have a regular doctor at present

## 2022-12-11 ENCOUNTER — Other Ambulatory Visit: Payer: Self-pay

## 2022-12-11 ENCOUNTER — Encounter (HOSPITAL_COMMUNITY): Payer: Self-pay | Admitting: *Deleted

## 2022-12-11 ENCOUNTER — Emergency Department (HOSPITAL_COMMUNITY): Payer: No Typology Code available for payment source

## 2022-12-11 ENCOUNTER — Encounter (HOSPITAL_COMMUNITY): Payer: Self-pay | Admitting: Emergency Medicine

## 2022-12-11 ENCOUNTER — Emergency Department (HOSPITAL_COMMUNITY)
Admission: EM | Admit: 2022-12-11 | Discharge: 2022-12-11 | Discharge status: Against Medical Advice | Payer: No Typology Code available for payment source | Attending: Emergency Medicine | Admitting: Emergency Medicine

## 2022-12-11 ENCOUNTER — Emergency Department (HOSPITAL_COMMUNITY)
Admission: EM | Admit: 2022-12-11 | Discharge: 2022-12-11 | Disposition: A | Discharge status: Home / Self Care | Payer: No Typology Code available for payment source | Attending: Emergency Medicine | Admitting: Emergency Medicine

## 2022-12-11 DIAGNOSIS — K644 Residual hemorrhoidal skin tags: Secondary | ICD-10-CM | POA: Diagnosis not present

## 2022-12-11 DIAGNOSIS — K6289 Other specified diseases of anus and rectum: Secondary | ICD-10-CM | POA: Diagnosis present

## 2022-12-11 DIAGNOSIS — Z5321 Procedure and treatment not carried out due to patient leaving prior to being seen by health care provider: Secondary | ICD-10-CM | POA: Insufficient documentation

## 2022-12-11 LAB — WET PREP, GENITAL
Clue Cells Wet Prep HPF POC: NONE SEEN
Sperm: NONE SEEN
Trich, Wet Prep: NONE SEEN
WBC, Wet Prep HPF POC: <10 (ref <10)
Yeast Wet Prep HPF POC: NONE SEEN

## 2022-12-11 LAB — URINALYSIS, ROUTINE W REFLEX MICROSCOPIC
Bilirubin Urine: NEGATIVE
Glucose, UA: NEGATIVE mg/dL
Hgb urine dipstick: NEGATIVE
Ketones, ur: 5 mg/dL — AB
Nitrite: NEGATIVE
Protein, ur: NEGATIVE mg/dL
Specific Gravity, Urine: 1.02 (ref 1.005–1.030)
pH: 6 (ref 5.0–8.0)

## 2022-12-11 LAB — CBC WITH DIFFERENTIAL/PLATELET
Abs Immature Granulocytes: 0.03 10*3/uL (ref 0.00–0.07)
Basophils Absolute: 0.1 10*3/uL (ref 0.0–0.1)
Basophils Relative: 1 %
Eosinophils Absolute: 0.1 10*3/uL (ref 0.0–0.5)
Eosinophils Relative: 1 %
HCT: 41.2 % (ref 36.0–46.0)
Hemoglobin: 13.4 g/dL (ref 12.0–15.0)
Immature Granulocytes: 0 %
Lymphocytes Relative: 19 %
Lymphs Abs: 2.1 10*3/uL (ref 0.7–4.0)
MCH: 29.6 pg (ref 26.0–34.0)
MCHC: 32.5 g/dL (ref 30.0–36.0)
MCV: 91.2 fL (ref 80.0–100.0)
Monocytes Absolute: 0.6 10*3/uL (ref 0.1–1.0)
Monocytes Relative: 5 %
Neutro Abs: 8.2 10*3/uL — ABNORMAL HIGH (ref 1.7–7.7)
Neutrophils Relative %: 74 %
Platelets: 230 10*3/uL (ref 150–400)
RBC: 4.52 MIL/uL (ref 3.87–5.11)
RDW: 12.1 % (ref 11.5–15.5)
WBC: 11.1 10*3/uL — ABNORMAL HIGH (ref 4.0–10.5)
nRBC: 0 % (ref 0.0–0.2)

## 2022-12-11 LAB — COMPREHENSIVE METABOLIC PANEL
ALT: 15 U/L (ref 0–44)
AST: 25 U/L (ref 15–41)
Albumin: 4.2 g/dL (ref 3.5–5.0)
Alkaline Phosphatase: 74 U/L (ref 38–126)
Anion gap: 13 (ref 5–15)
BUN: 14 mg/dL (ref 6–20)
CO2: 26 mmol/L (ref 22–32)
Calcium: 9.9 mg/dL (ref 8.9–10.3)
Chloride: 100 mmol/L (ref 98–111)
Creatinine, Ser: 0.76 mg/dL (ref 0.44–1.00)
GFR, Estimated: >60 mL/min (ref >60)
Glucose, Bld: 81 mg/dL (ref 70–99)
Potassium: 4.1 mmol/L (ref 3.5–5.1)
Sodium: 139 mmol/L (ref 135–145)
Total Bilirubin: 0.8 mg/dL (ref 0.3–1.2)
Total Protein: 7.3 g/dL (ref 6.5–8.1)

## 2022-12-11 LAB — LIPASE, BLOOD: Lipase: 25 U/L (ref 11–51)

## 2022-12-11 MED ORDER — IOHEXOL 350 MG/ML SOLN
75.0000 mL | Freq: Once | INTRAVENOUS | Status: AC | PRN
  Administered 2022-12-11: 75 mL via INTRAVENOUS

## 2022-12-11 NOTE — ED Notes (Signed)
RN got report that pt is outside screaming in the parking lot.

## 2022-12-11 NOTE — ED Notes (Signed)
Pt seen walking out of lobby.  

## 2022-12-11 NOTE — ED Notes (Signed)
Pt refused temp 

## 2022-12-11 NOTE — ED Triage Notes (Signed)
Pt reports something is "coming out" of her rectum.  Reports it is "dripping"  States she has had no bleeding but is concerned about a fistula.

## 2022-12-11 NOTE — ED Triage Notes (Signed)
Patient presents to ED c/o rectal pressure, states she doesn't have any pain it just" feels like there is a roll of tissue is up her rectum. C/o yellowish discharge at times. Denies bleeding. Onset of sx. Several months ago now getting worse. States she has had weight loss.

## 2022-12-11 NOTE — ED Provider Notes (Signed)
[place] EMERGENCY DEPARTMENT AT [facility] Provider Note   CSN: [id number] Arrival date & time: 12/11/22  4098    History  Chief Complaint  Patient presents with   Rectal Bleeding    Kristina Hardy is a 52 y.o. female here for evaluation of rectal pressure. States for the last 3 months she has developed pressure to her rectum. Worse when needing to have a BM. No blood in stool. Feels like she has to lift "something up" to have a BM. Feels "like there is leftover toilet paper at my butt." Hx of hemorrhoids. No blood on TP when wiping.  Think when she wipes she occasionally has yellow/ clear discharge, she is unsure where this is coming from. No fever, emesis, CP, SOB, abd pain, dysuria, hematuria, back pain. No vag discharge. Unsure of possible STD. Not currently sexually active.  No abdominal pain, back pain, numbness, weakness.  No history of IV drug use.  Denies possibility of foreign object.  Last bowel movement yesterday evening, no black, tarry stools, no gross blood.  HPI     Home Medications Prior to Admission medications   Medication Sig Start Date End Date Taking? Authorizing Provider  ALPRAZolam [name]) 1 MG tablet Take 1 mg by mouth 5 (five) times daily as needed. For anxiety.    [provider]  azithromycin (ZITHROMAX Z-PAK) 250 MG tablet 2 po day one, then 1 daily x 4 days 05/08/22   Fayrene Helper, PA-C  azithromycin [facility]) 250 MG tablet Take 2 tablets by mouth on day one, then take 1 tablet daily for four days thereafter 05/08/22   Fayrene Helper, PA-C  benzonatate (TESSALON) 100 MG capsule Take 1 capsule (100 mg total) by mouth every 8 (eight) hours. 05/08/22   Fayrene Helper, PA-C  benzonatate (TESSALON) 100 MG capsule Take 1 capsule (100 mg total) by mouth every 8 (eight) hours. 05/08/22   Fayrene Helper, PA-C  cephALEXin (KEFLEX) 500 MG capsule Take one capsule (500 mg dose) by mouth 2 (two) times daily for 7 days. 11/26/21     cloNIDine (CATAPRES) 0.2 MG tablet  Take 0.5 tablets (0.1 mg total) by mouth 2 (two) times daily. 01/13/12   Sunnie Nielsen, MD  methocarbamol (ROBAXIN) 500 MG tablet Take one tablet (500 mg dose) by mouth 2 (two) times daily for 10 days. 11/26/21     Oxycodone HCl 10 MG TABS Take 10 mg by mouth 3 (three) times daily.    [provider]  Oxycodone HCl 10 MG TABS TAKE 1 TABLET BY MOUTH 4 TIMES DAILY AS NEEDED 01/07/21     Oxycodone HCl 10 MG TABS Take 1 tablet by mouth 4 times a day as needed. 06/25/21     Oxycodone HCl 10 MG TABS Take 1 tablet by mouth 4 times a day as needed. 07/09/21     Oxycodone HCl 10 MG TABS Take 1 tablet by mouth 4 times a day as needed. 07/23/21     Oxycodone HCl 10 MG TABS Take 1 tablet by mouth 4 times a day as needed. 09/17/21     Oxycodone HCl 10 MG TABS Take 1 tablet by mouth 4 times a day as needed 10/02/21     Oxycodone HCl 10 MG TABS Take 1 tablet by mouth 4 times a day as needed. 10/16/21     predniSONE (DELTASONE) 20 MG tablet 3 tabs po day one, then 2 tabs daily x 4 days 05/08/22   Fayrene Helper, PA-C  sertraline (ZOLOFT) 50  MG tablet Take 75 mg by mouth daily.    [provider]      Allergies    Klonopin [clonazepam], Nsaids, Robaxin [methocarbamol], and Vicodin [hydrocodone-acetaminophen]    Review of Systems   Review of Systems  Constitutional: Negative.   HENT: Negative.    Respiratory: Negative.    Cardiovascular: Negative.   Gastrointestinal: Negative.        Discharge, rectal pressure  Genitourinary: Negative.   Musculoskeletal: Negative.   Skin: Negative.   Neurological: Negative.   All other systems reviewed and are negative.   Physical Exam Updated Vital Signs BP (!) 115/90   Pulse 60   Temp 97.9 F (36.6 C) (Oral)   Resp 16   Ht 5\' 2"  (1.575 m)   Wt 43.1 kg   LMP 11/17/2011   SpO2 100%   BMI 17.38 kg/m  Physical Exam Vitals and nursing note reviewed.  Constitutional:      General: She is not in acute distress.    Appearance: She is well-developed. She is  not ill-appearing.  HENT:     Head: Atraumatic.  Eyes:     Pupils: Pupils are equal, round, and reactive to light.  Cardiovascular:     Rate and Rhythm: Normal rate.     Pulses: Normal pulses.          Radial pulses are 2+ on the right side and 2+ on the left side.     Heart sounds: Normal heart sounds.  Pulmonary:     Effort: Pulmonary effort is normal. No respiratory distress.     Breath sounds: Normal breath sounds.     Comments: Clear bilaterally, speaks in full sentences without difficulty Abdominal:     General: Bowel sounds are normal. There is no distension.     Palpations: Abdomen is soft.     Tenderness: There is no abdominal tenderness. There is no right CVA tenderness, left CVA tenderness, guarding or rebound.  Genitourinary:    Comments: Chaperone present in room for exam.  No fluctuance, induration to gluteus, perineal region.  No erythema.  Hemorrhoid skin tag at 6 o'clock position.  Light brown stool in rectal vault.  Musculoskeletal:        General: No swelling, tenderness, deformity or signs of injury. Normal range of motion.     Cervical back: Normal range of motion.     Right lower leg: No edema.     Left lower leg: No edema.     Comments: No bony tenderness, full range of motion, ambulatory  Skin:    General: Skin is warm and dry.  Neurological:     General: No focal deficit present.     Mental Status: She is alert.  Psychiatric:        Mood and Affect: Mood normal.     ED Results / Procedures / Treatments   Labs (all labs ordered are listed, but only abnormal results are displayed) Labs Reviewed  CBC WITH DIFFERENTIAL/PLATELET - Abnormal; Notable for the following components:      Result Value   WBC 11.1 (*)    Neutro Abs 8.2 (*)    All other components within normal limits  URINALYSIS, ROUTINE W REFLEX MICROSCOPIC - Abnormal; Notable for the following components:   APPearance HAZY (*)    Ketones, ur 5 (*)    Leukocytes,Ua TRACE (*)    Bacteria,  UA RARE (*)    All other components within normal limits  WET PREP, GENITAL  COMPREHENSIVE  METABOLIC PANEL  LIPASE, BLOOD  GC/CHLAMYDIA PROBE AMP ([place]) NOT AT [facility]    EKG None  Radiology CT ABDOMEN PELVIS W CONTRAST  Result Date: 12/11/2022 CLINICAL DATA:  Rectal pain EXAM: CT ABDOMEN AND PELVIS WITH CONTRAST TECHNIQUE: Multidetector CT imaging of the abdomen and pelvis was performed using the standard protocol following bolus administration of intravenous contrast. RADIATION DOSE REDUCTION: This exam was performed according to the departmental dose-optimization program which includes automated exposure control, adjustment of the mA and/or kV according to patient size and/or use of iterative reconstruction technique. CONTRAST:  75mL OMNIPAQUE IOHEXOL 350 MG/ML SOLN COMPARISON:  None Available. FINDINGS: Lower chest: There is some linear opacity in the left lung base likely scar or atelectasis. No pleural effusion. Breathing motion at the lung bases. Emphysematous change. Hepatobiliary: No focal liver abnormality is seen. No gallstones, gallbladder wall thickening, or biliary dilatation. Patent portal vein Pancreas: Unremarkable. No pancreatic ductal dilatation or surrounding inflammatory changes. Spleen: Normal in size without focal abnormality. Adrenals/Urinary Tract: Adrenal glands are preserved. There are some tiny low-attenuation lesions in the right kidney which are too small to completely characterize although statistically likely benign cysts, Bosniak 2 lesions. No specific imaging follow-up. The ureters have normal course and caliber extending down to the bladder. Preserved contours of the urinary bladder. Stomach/Bowel: On this non oral contrast exam there is scattered colonic stool. The large bowel, small bowel and stomach are nondilated. Stomach is overall relatively collapsed. The appendix is poorly seen adjacent to the cecum in the right hemipelvis. No clear inflammatory changes  identified in the area of the cecum but evaluation limited by lack of intra-fat and oral contrast. In the area of the rectum there is no clear inflammatory changes, air or fluid collections. If there is further concern rectal fistula, an MRI may be of some benefit for further sensitivity when clinically appropriate. Vascular/Lymphatic: No significant vascular findings are present. No enlarged abdominal or pelvic lymph nodes. Reproductive: Uterus and bilateral adnexa are unremarkable. Other: No definite free air or free fluid.  Some motion. Musculoskeletal: Curvature of the spine with moderate lower lumbar degenerative changes. Areas of stenosis. Anterolisthesis of L4 on L5 as well. IMPRESSION: Scattered colonic stool. No bowel obstruction, free air or free fluid. The appendix is poorly seen next of the cecum. No loculated areas of fluid or gas in the area of the rectum. No inflammatory changes. Further workup as clinically directed for a rectal fistula. Electronically Signed   By: Karen Kays M.D.   On: 12/11/2022 10:38    Procedures Procedures    Medications Ordered in ED Medications  iohexol (OMNIPAQUE) 350 MG/ML injection 75 mL (75 mLs Intravenous Contrast Given 12/11/22 0934)    ED Course/ Medical Decision Making/ A&P   52 year old here for evaluation of rectal pressure.  Has been going on over the last 3 months.  No pain.  No bloody stool.  No abdominal pain.  Feels like she has to "lift something up" when she has a bowel movement.  Last bowel movement yesterday.  Occasionally has discharge from her GU region however is unsure where this is coming from.  She denies any vaginal discharge.  No UTI symptoms.  Her heart lungs are clear.  Abdomen is soft, tender.  She denies any possibility of foreign object in her rectum.  She thinks she has a "fissure" however states she has never been diagnosed with this.  On exam patient.   Labs and imaging personally viewed and interpreted:  CBC leukocytosis  11.1 Metabolic panel without significant abnormality Lipase 25 Wet prep negative CT AP that significant abnormality, can get MRI if persistent concern for fissure UA neg  Patient reassessed.  Requesting breakfast today.  Discussed holding on p.o. until CT scan results. Agreeable at this time.  Patient reassessed.  No pain.  Eating and drinking without difficulty.  We discussed her labs and imaging.  I suspect likely the mass she is feeling in her rectum is a hemorrhoid skin tag.  She has no evidence to suggest rectal prolapse, infectious process, fissure, foreign object, thrombosed hemorrhoid, lesions concerning for malignancy.  Will have her follow-up outpatient for reassessment, return for new or worsening symptoms.  With regards to her malodorous drainage, none on exam.  She did not want pelvic exam with speculum, she self swabbed.  Wet prep negative.  She has no evidence of forniers gangrene, necrotizing fasciitis.  Declines empiric treatment for GC, chlamydia.  She already had IV placed from nursing staff prior to my assessment she declines HIV and syphilis testing with repeat IV stick.  The patient has been appropriately medically screened and/or stabilized in the ED. I have low suspicion for any other emergent medical condition which would require further screening, evaluation or treatment in the ED or require inpatient management.  Patient is hemodynamically stable and in no acute distress.  Patient able to ambulate in department prior to ED.  Evaluation does not show acute pathology that would require ongoing or additional emergent interventions while in the emergency department or further inpatient treatment.  I have discussed the diagnosis with the patient and answered all questions.  Pain is been managed while in the emergency department and patient has no further complaints prior to discharge.  Patient is comfortable with plan discussed in room and is stable for discharge at this time.  I  have discussed strict return precautions for returning to the emergency department.  Patient was encouraged to follow-up with PCP/specialist refer to at discharge.                                  Medical Decision Making Amount and/or Complexity of Data Reviewed External Data Reviewed: labs, radiology and notes. Labs: ordered. Decision-making details documented in ED Course. Radiology: ordered and independent interpretation performed. Decision-making details documented in ED Course.  Risk OTC drugs. Prescription drug management. Diagnosis or treatment significantly limited by social determinants of health.          Final Clinical Impression(s) / ED Diagnoses Final diagnoses:  Residual hemorrhoidal skin tags    Rx / DC Orders ED Discharge Orders     None         Judd Mccubbin A, PA-C 12/11/22 1116    Rexford Maus, DO 12/11/22 1530

## 2022-12-11 NOTE — ED Notes (Signed)
Apparently pt is not coming back into the ED.

## 2022-12-11 NOTE — ED Notes (Signed)
Attempted IV insertion without success x2, another staff will attempt.

## 2022-12-11 NOTE — Discharge Instructions (Signed)
You have a hemorrhoid skin tag this is what you are feeling at your rectum.  Your CT scan and labs are reassuring  Follow-up outpatient, return for any worsening symptoms.

## 2022-12-14 LAB — GC/CHLAMYDIA PROBE AMP (~~LOC~~) NOT AT ARMC
Chlamydia: NEGATIVE
Comment: NEGATIVE
Comment: NORMAL
Neisseria Gonorrhea: NEGATIVE

## 2023-01-07 ENCOUNTER — Emergency Department (HOSPITAL_COMMUNITY): Payer: No Typology Code available for payment source

## 2023-01-07 ENCOUNTER — Encounter (HOSPITAL_COMMUNITY): Payer: Self-pay | Admitting: *Deleted

## 2023-01-07 ENCOUNTER — Other Ambulatory Visit (HOSPITAL_COMMUNITY): Payer: Self-pay

## 2023-01-07 ENCOUNTER — Other Ambulatory Visit: Payer: Self-pay

## 2023-01-07 ENCOUNTER — Emergency Department (HOSPITAL_COMMUNITY)
Admission: EM | Admit: 2023-01-07 | Discharge: 2023-01-07 | Disposition: A | Discharge status: Home / Self Care | Payer: No Typology Code available for payment source | Attending: Emergency Medicine | Admitting: Emergency Medicine

## 2023-01-07 DIAGNOSIS — K219 Gastro-esophageal reflux disease without esophagitis: Secondary | ICD-10-CM | POA: Diagnosis not present

## 2023-01-07 DIAGNOSIS — S51812A Laceration without foreign body of left forearm, initial encounter: Secondary | ICD-10-CM | POA: Insufficient documentation

## 2023-01-07 DIAGNOSIS — E039 Hypothyroidism, unspecified: Secondary | ICD-10-CM | POA: Diagnosis not present

## 2023-01-07 DIAGNOSIS — R0982 Postnasal drip: Secondary | ICD-10-CM | POA: Diagnosis not present

## 2023-01-07 DIAGNOSIS — W01118A Fall on same level from slipping, tripping and stumbling with subsequent striking against other sharp object, initial encounter: Secondary | ICD-10-CM | POA: Diagnosis not present

## 2023-01-07 DIAGNOSIS — Z23 Encounter for immunization: Secondary | ICD-10-CM | POA: Insufficient documentation

## 2023-01-07 MED ORDER — OMEPRAZOLE 20 MG PO CPDR
20.0000 mg | DELAYED_RELEASE_CAPSULE | Freq: Every day | ORAL | 0 refills | Status: DC
Start: 2023-01-07 — End: 2024-02-01
  Filled 2023-01-07: qty 30, 30d supply, fill #0

## 2023-01-07 MED ORDER — TETANUS-DIPHTH-ACELL PERTUSSIS 5-2.5-18.5 LF-MCG/0.5 IM SUSY
0.5000 mL | PREFILLED_SYRINGE | Freq: Once | INTRAMUSCULAR | Status: AC
  Administered 2023-01-07: 0.5 mL via INTRAMUSCULAR
  Filled 2023-01-07: qty 0.5

## 2023-01-07 MED ORDER — LIDOCAINE-EPINEPHRINE (PF) 2 %-1:200000 IJ SOLN
10.0000 mL | Freq: Once | INTRAMUSCULAR | Status: AC
  Administered 2023-01-07: 10 mL
  Filled 2023-01-07: qty 20

## 2023-01-07 MED ORDER — CEPHALEXIN 500 MG PO CAPS
500.0000 mg | ORAL_CAPSULE | Freq: Four times a day (QID) | ORAL | 0 refills | Status: AC
  Filled 2023-01-07: qty 20, 5d supply, fill #0

## 2023-01-07 MED ORDER — FLUTICASONE PROPIONATE 50 MCG/ACT NA SUSP
1.0000 | Freq: Every day | NASAL | 2 refills | Status: DC
Start: 2023-01-07 — End: 2023-06-20
  Filled 2023-01-07: qty 16, 60d supply, fill #0

## 2023-01-07 NOTE — ED Provider Notes (Signed)
[place] EMERGENCY DEPARTMENT AT [facility] Provider Note   CSN: [id number] Arrival date & time: 01/07/23  1114     History  Chief Complaint  Patient presents with   Laceration    Kristina Hardy is a 52 y.o. female with history of hepatitis C, endometriosis, anxiety, depression, hypothyroidism, prior substance abuse currently on Suboxone, who presents to the emergency department complaining of an arm laceration.  Patient states that she was trying to control her new dog last night, when she fell.  She tried to break her fall by sticking out her left arm, but did fall onto someone else's toolbox.  The person that she was with looked to the tools and believes that she cut it on a box cutter.  Unknown last tetanus.  She was having some tingling in the left hand.  She was able to control the bleeding herself.  Patient also requesting medication for GERD and postnasal drip.    Laceration      Home Medications Prior to Admission medications   Medication Sig Start Date End Date Taking? Authorizing Provider  cephALEXin (KEFLEX) 500 MG capsule Take 1 capsule (500 mg total) by mouth 4 (four) times daily for 5 days. 01/07/23 01/12/23 Yes Juvencio Verdi T, PA-C  fluticasone (FLONASE) 50 MCG/ACT nasal spray Place 1 spray into both nostrils daily. 01/07/23  Yes Jerald Hennington T, PA-C  omeprazole (PRILOSEC) 20 MG capsule Take 1 capsule (20 mg total) by mouth daily. 01/07/23  Yes Laurice Iglesia T, PA-C  ALPRAZolam (XANAX) 1 MG tablet Take 1 mg by mouth 5 (five) times daily as needed. For anxiety.    [provider]  azithromycin (ZITHROMAX Z-PAK) 250 MG tablet 2 po day one, then 1 daily x 4 days 05/08/22   Fayrene Helper, PA-C  azithromycin [facility]) 250 MG tablet Take 2 tablets by mouth on day one, then take 1 tablet daily for four days thereafter 05/08/22   Fayrene Helper, PA-C  benzonatate (TESSALON) 100 MG capsule Take 1 capsule (100 mg total) by mouth every 8 (eight) hours.  05/08/22   Fayrene Helper, PA-C  benzonatate (TESSALON) 100 MG capsule Take 1 capsule (100 mg total) by mouth every 8 (eight) hours. 05/08/22   Fayrene Helper, PA-C  cloNIDine (CATAPRES) 0.2 MG tablet Take 0.5 tablets (0.1 mg total) by mouth 2 (two) times daily. 01/13/12   Sunnie Nielsen, MD  methocarbamol (ROBAXIN) 500 MG tablet Take one tablet (500 mg dose) by mouth 2 (two) times daily for 10 days. 11/26/21     predniSONE (DELTASONE) 20 MG tablet 3 tabs po day one, then 2 tabs daily x 4 days 05/08/22   Fayrene Helper, PA-C  sertraline (ZOLOFT) 50 MG tablet Take 75 mg by mouth daily.    [provider]      Allergies    Klonopin [clonazepam], Nsaids, Robaxin [methocarbamol], and Vicodin [hydrocodone-acetaminophen]    Review of Systems   Review of Systems  Skin:  Positive for wound.  All other systems reviewed and are negative.   Physical Exam Updated Vital Signs BP 110/80   Pulse 80   Temp 98 F (36.7 C) (Oral)   Resp 16   Ht 5\' 2"  (1.575 m)   Wt 43.1 kg   LMP 11/17/2011   SpO2 100%   BMI 17.38 kg/m  Physical Exam Vitals and nursing note reviewed.  Constitutional:      Appearance: Normal appearance.  HENT:     Head: Normocephalic and atraumatic.  Eyes:  Conjunctiva/sclera: Conjunctivae normal.  Cardiovascular:     Pulses:          Radial pulses are 2+ on the right side and 2+ on the left side.  Pulmonary:     Effort: Pulmonary effort is normal. No respiratory distress.  Musculoskeletal:     Comments: Normal range of motion of the digits of the left hand and the wrist. Normal grip strength.  Skin:    General: Skin is warm and dry.     Comments: Approximately 3 cm linear laceration to the ventral left forearm with subcutaneous tissue visualized, bleeding is controlled  Neurological:     Mental Status: She is alert.     Comments: Normal sensation in BUE  Psychiatric:        Mood and Affect: Mood normal.        Behavior: Behavior normal.     ED Results / Procedures /  Treatments   Labs (all labs ordered are listed, but only abnormal results are displayed) Labs Reviewed - No data to display  EKG None  Radiology DG Forearm Left  Result Date: 01/07/2023 CLINICAL DATA:  Laceration. EXAM: LEFT FOREARM - 2 VIEW COMPARISON:  None Available. FINDINGS: Soft tissue laceration is noted at the mid lateral aspect of the forearm. There is surrounding soft tissue swelling. No radiopaque foreign body. No acute fracture or dislocation. IMPRESSION: Soft tissue laceration at the mid lateral forearm with surrounding swelling. No radiopaque foreign body. No acute osseous abnormality. Electronically Signed   By: Hart Robinsons M.D.   On: 01/07/2023 14:23    Procedures .Marland KitchenLaceration Repair  Date/Time: 01/07/2023 3:26 PM  Performed by: Su Monks, PA-C Authorized by: Su Monks, PA-C   Consent:    Consent obtained:  Verbal   Consent given by:  Patient   Risks, benefits, and alternatives were discussed: yes     Risks discussed:  Infection, pain, poor cosmetic result, poor wound healing and nerve damage Universal protocol:    Patient identity confirmed:  Provided demographic data Anesthesia:    Anesthesia method:  Local infiltration   Local anesthetic:  Lidocaine 2% WITH epi Laceration details:    Location:  Shoulder/arm   Shoulder/arm location:  L lower arm   Length (cm):  3 Exploration:    Hemostasis achieved with:  Epinephrine and direct pressure   Imaging obtained: x-ray     Imaging outcome: foreign body not noted     Wound exploration: wound explored through full range of motion     Wound extent: fascia not violated, no signs of injury and no vascular damage     Contaminated: no   Treatment:    Area cleansed with:  Shur-Clens and saline   Amount of cleaning:  Standard   Irrigation solution:  Sterile saline   Irrigation volume:  100 cc   Irrigation method:  Syringe   Visualized foreign bodies/material removed: no     Debridement:  None    Undermining:  None   Scar revision: no   Skin repair:    Repair method:  Sutures   Suture size:  5-0   Suture material:  Nylon   Suture technique:  Simple interrupted   Number of sutures:  4 Approximation:    Approximation:  Loose Repair type:    Repair type:  Simple Post-procedure details:    Dressing:  Non-adherent dressing   Procedure completion:  Tolerated well, no immediate complications     Medications Ordered in ED Medications  lidocaine-EPINEPHrine (  XYLOCAINE W/EPI) 2 %-1:200000 (PF) injection 10 mL (10 mLs Infiltration Given by Other 01/07/23 1329)  Tdap (BOOSTRIX) injection 0.5 mL (0.5 mLs Intramuscular Given 01/07/23 1329)    ED Course/ Medical Decision Making/ A&P                                 Medical Decision Making Amount and/or Complexity of Data Reviewed Radiology: ordered.  Risk Prescription drug management.   Patient is a 52 y.o. female who presents to the emergency department with concern for forearm laceration. Wound occurred < 24 hrs prior to ER arrival.   Physical exam: Approximately 3 cm linear laceration to the ventral left forearm with subcutaneous tissue visualized. Neurovascularly intact in BUE.   Imaging: XR shows soft tissue injury, no bony abnormality  Procedure: Wound explored and base of wound visualized in a bloodless field without evidence of foreign body. Laceration was anesthestized with lidocaine with epi and closed with sutures. Patient tolerated procedure well with no immediate complications. Tdap was updated.   Disposition: Patient discharged with antibiotics.  Discussed suture home care with patient and answered questions. Patient to follow-up for wound check and suture removal in 7 days; they are to return to the ED sooner for signs of infection. Pt is hemodynamically stable with no complaints prior to discharge.  Final Clinical Impression(s) / ED Diagnoses Final diagnoses:  Laceration of left forearm, initial encounter   Postnasal drip  Gastroesophageal reflux disease without esophagitis    Rx / DC Orders ED Discharge Orders          Ordered    omeprazole (PRILOSEC) 20 MG capsule  Daily        01/07/23 1456    fluticasone (FLONASE) 50 MCG/ACT nasal spray  Daily        01/07/23 1456    cephALEXin (KEFLEX) 500 MG capsule  4 times daily        01/07/23 1456           Portions of this report may have been transcribed using voice recognition software. Every effort was made to ensure accuracy; however, inadvertent computerized transcription errors may be present.    Talisa Petrak T, PA-C 01/07/23 1529    Elayne Snare K, DO 01/07/23 1604

## 2023-01-07 NOTE — Discharge Instructions (Signed)
You were seen in the emergency department for forearm laceration.   We have closed your laceration(s) with sutures. These need to be removed in 7 days. This can be done at any doctor's office, urgent care, or emergency department.   If any of the sutures come out before it is time for removal, that is okay. Make sure to keep the area as clean and dry as possible. You can let warm soapy warm run over the area, but do NOT scrub it.   Watch out for signs of infection, like we discussed, including: increased redness, tenderness, or drainage of pus from the area. If this happens and you have not been prescribed an antibiotic, please seek medical attention for possible infection.   You can take over the counter pain medicine like ibuprofen or tylenol as needed.

## 2023-01-07 NOTE — ED Triage Notes (Signed)
Pt tripped and fell last night and cut lateral left arm, dressing not removed in triage.

## 2023-01-08 ENCOUNTER — Other Ambulatory Visit (HOSPITAL_COMMUNITY): Payer: Self-pay

## 2023-01-11 ENCOUNTER — Other Ambulatory Visit (HOSPITAL_COMMUNITY): Payer: Self-pay

## 2023-01-14 ENCOUNTER — Emergency Department (HOSPITAL_COMMUNITY)
Admission: EM | Admit: 2023-01-14 | Discharge: 2023-01-16 | Disposition: A | Discharge status: Home / Self Care | Payer: No Typology Code available for payment source | Attending: Emergency Medicine | Admitting: Emergency Medicine

## 2023-01-14 ENCOUNTER — Encounter (HOSPITAL_COMMUNITY): Payer: Self-pay

## 2023-01-14 ENCOUNTER — Emergency Department (HOSPITAL_COMMUNITY): Payer: No Typology Code available for payment source

## 2023-01-14 ENCOUNTER — Other Ambulatory Visit: Payer: Self-pay

## 2023-01-14 DIAGNOSIS — N898 Other specified noninflammatory disorders of vagina: Secondary | ICD-10-CM | POA: Insufficient documentation

## 2023-01-14 DIAGNOSIS — J029 Acute pharyngitis, unspecified: Secondary | ICD-10-CM | POA: Insufficient documentation

## 2023-01-14 DIAGNOSIS — F29 Unspecified psychosis not due to a substance or known physiological condition: Secondary | ICD-10-CM | POA: Diagnosis present

## 2023-01-14 DIAGNOSIS — L03114 Cellulitis of left upper limb: Secondary | ICD-10-CM | POA: Diagnosis not present

## 2023-01-14 DIAGNOSIS — F330 Major depressive disorder, recurrent, mild: Secondary | ICD-10-CM | POA: Insufficient documentation

## 2023-01-14 DIAGNOSIS — M542 Cervicalgia: Secondary | ICD-10-CM | POA: Diagnosis not present

## 2023-01-14 LAB — WET PREP, GENITAL
Clue Cells Wet Prep HPF POC: NONE SEEN
Sperm: NONE SEEN
Trich, Wet Prep: NONE SEEN
WBC, Wet Prep HPF POC: >=10 — AB (ref <10)
Yeast Wet Prep HPF POC: NONE SEEN

## 2023-01-14 LAB — BASIC METABOLIC PANEL
Anion gap: 12 (ref 5–15)
BUN: 14 mg/dL (ref 6–20)
CO2: 28 mmol/L (ref 22–32)
Calcium: 9.4 mg/dL (ref 8.9–10.3)
Chloride: 98 mmol/L (ref 98–111)
Creatinine, Ser: 0.63 mg/dL (ref 0.44–1.00)
GFR, Estimated: >60 mL/min (ref >60)
Glucose, Bld: 123 mg/dL — ABNORMAL HIGH (ref 70–99)
Potassium: 3.5 mmol/L (ref 3.5–5.1)
Sodium: 138 mmol/L (ref 135–145)

## 2023-01-14 LAB — URINALYSIS, ROUTINE W REFLEX MICROSCOPIC
Bilirubin Urine: NEGATIVE
Glucose, UA: NEGATIVE mg/dL
Ketones, ur: 5 mg/dL — AB
Nitrite: NEGATIVE
Protein, ur: NEGATIVE mg/dL
Specific Gravity, Urine: 1.017 (ref 1.005–1.030)
pH: 6 (ref 5.0–8.0)

## 2023-01-14 LAB — RAPID URINE DRUG SCREEN, HOSP PERFORMED
Amphetamines: POSITIVE — AB
Barbiturates: NOT DETECTED
Benzodiazepines: POSITIVE — AB
Cocaine: NOT DETECTED
Opiates: NOT DETECTED
Tetrahydrocannabinol: NOT DETECTED

## 2023-01-14 LAB — CBC WITH DIFFERENTIAL/PLATELET
Abs Immature Granulocytes: 0.04 10*3/uL (ref 0.00–0.07)
Basophils Absolute: 0 10*3/uL (ref 0.0–0.1)
Basophils Relative: 0 %
Eosinophils Absolute: 0.2 10*3/uL (ref 0.0–0.5)
Eosinophils Relative: 2 %
HCT: 36.4 % (ref 36.0–46.0)
Hemoglobin: 11.9 g/dL — ABNORMAL LOW (ref 12.0–15.0)
Immature Granulocytes: 0 %
Lymphocytes Relative: 25 %
Lymphs Abs: 2.2 10*3/uL (ref 0.7–4.0)
MCH: 30.4 pg (ref 26.0–34.0)
MCHC: 32.7 g/dL (ref 30.0–36.0)
MCV: 93.1 fL (ref 80.0–100.0)
Monocytes Absolute: 0.5 10*3/uL (ref 0.1–1.0)
Monocytes Relative: 6 %
Neutro Abs: 6 10*3/uL (ref 1.7–7.7)
Neutrophils Relative %: 67 %
Platelets: 327 10*3/uL (ref 150–400)
RBC: 3.91 MIL/uL (ref 3.87–5.11)
RDW: 12.2 % (ref 11.5–15.5)
WBC: 8.9 10*3/uL (ref 4.0–10.5)
nRBC: 0 % (ref 0.0–0.2)

## 2023-01-14 LAB — HIV ANTIBODY (ROUTINE TESTING W REFLEX): HIV Screen 4th Generation wRfx: NONREACTIVE

## 2023-01-14 LAB — GROUP A STREP BY PCR: Group A Strep by PCR: NOT DETECTED

## 2023-01-14 MED ORDER — CEFTRIAXONE SODIUM 500 MG IJ SOLR
500.0000 mg | Freq: Once | INTRAMUSCULAR | Status: AC
  Administered 2023-01-15: 500 mg via INTRAMUSCULAR
  Filled 2023-01-14: qty 500

## 2023-01-14 MED ORDER — DOXYCYCLINE HYCLATE 100 MG PO TABS
100.0000 mg | ORAL_TABLET | Freq: Two times a day (BID) | ORAL | Status: DC
  Administered 2023-01-15 – 2023-01-16 (×4): 100 mg via ORAL
  Filled 2023-01-14 (×4): qty 1

## 2023-01-14 MED ORDER — LIDOCAINE HCL (PF) 1 % IJ SOLN
1.0000 mL | Freq: Once | INTRAMUSCULAR | Status: AC
  Administered 2023-01-15: 1 mL
  Filled 2023-01-14: qty 5
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
## 2023-01-14 NOTE — ED Triage Notes (Addendum)
Pt c/o sore throatx58yr. Pt c/o dysphagiax84mo, but worse the last 2wks. Pt c/o neck pain worse the last month. Pt states she has noticed she has a metallic taste in her mouth and her breath stinks even though she brushes her teeth. Pt c/o yellow vaginal dischargex61mos

## 2023-01-15 DIAGNOSIS — F330 Major depressive disorder, recurrent, mild: Secondary | ICD-10-CM | POA: Diagnosis not present

## 2023-01-15 DIAGNOSIS — F29 Unspecified psychosis not due to a substance or known physiological condition: Secondary | ICD-10-CM

## 2023-01-15 DIAGNOSIS — J029 Acute pharyngitis, unspecified: Secondary | ICD-10-CM | POA: Diagnosis not present

## 2023-01-15 LAB — GC/CHLAMYDIA PROBE AMP (~~LOC~~) NOT AT ARMC
Chlamydia: NEGATIVE
Comment: NEGATIVE
Comment: NORMAL
Neisseria Gonorrhea: NEGATIVE

## 2023-01-15 LAB — SYPHILIS: RPR W/REFLEX TO RPR TITER AND TREPONEMAL ANTIBODIES, TRADITIONAL SCREENING AND DIAGNOSIS ALGORITHM: RPR Ser Ql: NONREACTIVE

## 2023-01-15 MED ORDER — RISPERIDONE 1 MG PO TABS
1.0000 mg | ORAL_TABLET | Freq: Every day | ORAL | Status: DC
  Administered 2023-01-15: 1 mg via ORAL
  Filled 2023-01-15 (×2): qty 1

## 2023-01-15 MED ORDER — NICOTINE 21 MG/24HR TD PT24
21.0000 mg | MEDICATED_PATCH | Freq: Every day | TRANSDERMAL | Status: DC
  Administered 2023-01-15 – 2023-01-16 (×2): 21 mg via TRANSDERMAL
  Filled 2023-01-15 (×2): qty 1

## 2023-01-15 MED ORDER — CLONIDINE HCL 0.2 MG PO TABS
0.1000 mg | ORAL_TABLET | Freq: Three times a day (TID) | ORAL | Status: DC
  Administered 2023-01-15: 0.1 mg via ORAL
  Filled 2023-01-15 (×3): qty 1

## 2023-01-15 MED ORDER — BUPRENORPHINE HCL-NALOXONE HCL 8-2 MG SL SUBL
1.0000 | SUBLINGUAL_TABLET | Freq: Every day | SUBLINGUAL | Status: DC
  Administered 2023-01-15 – 2023-01-16 (×2): 1 via SUBLINGUAL
  Filled 2023-01-15 (×2): qty 1

## 2023-01-15 MED ORDER — PANTOPRAZOLE SODIUM 40 MG PO TBEC
40.0000 mg | DELAYED_RELEASE_TABLET | Freq: Every day | ORAL | Status: DC
  Administered 2023-01-15 – 2023-01-16 (×2): 40 mg via ORAL
  Filled 2023-01-15 (×2): qty 1

## 2023-01-15 NOTE — ED Provider Notes (Signed)
Called to bedside to evaluate the patient after she was requesting to be discharged.  She is not under IVC.  Psychiatry is evaluated the patient and recommended inpatient.  When I evaluated her she is calm, states that she thought about leaving but now has changed her decision.  She is lying in bed, has no acute complaints besides mild nausea.  Has no intention to leave at this time.  I explained to her that she is not committed and that she would be free to leave AGAINST MEDICAL ADVICE but we would prefer for her to seek the appropriate treatment avenues which sounds to be inpatient psychiatric care.   Kristina Logan, DO 01/15/23 2017

## 2023-01-15 NOTE — ED Notes (Signed)
ED Provider at bedside. 

## 2023-01-15 NOTE — Progress Notes (Signed)
LCSW Progress Note  [id number]   Tarika Mckethan  01/15/2023  2:31 PM  Description:   Inpatient Psychiatric Referral  Patient was recommended inpatient per Phebe Colla, NP. There are no available beds at [facility], per [facility], RN. Patient was referred to the following out of network facilities:   Destination  Service Provider Address Phone Fax  [facility]  [address], [place] [address] [phone]  [facility]  [address] [place] [address] [phone]  [facility]  [address], [place] [address] [phone]  [facility]  [address] [place], [place] [address] [phone]  [facility]  [address] [place], [place] [address] [phone]  [facility]  [address] [place] [address] [phone]  [facility]-Ad[facility]  [address] [place], [place] [address] [phone]  [facility]  [address] [place] [address] [phone]  [facility]  [address] [place] [address] [phone]  [facility]  [address] [place] [address] [phone]  [facility]  [address], [place] [address] [phone]  [facility]  [address], [place] [address] [phone]  [facility]  [place] [phone]  [facility]  [address] [place], [place] [phone]  [facility]  [address] [place] [address] [phone]  [facility]  [address]  [place] [address] [phone]  [facility]  [address] [place], [place] [address] [phone]  [facility]  [address], [place] [address] [phone]  [facility] Psychiat[facility] Inpatient EFAX  [place] [phone]    Situation ongoing, CSW to continue following and update chart as more information becomes available.   Cathie Beams, MSW, LCSW  01/15/2023 2:31 PM

## 2023-01-15 NOTE — ED Notes (Signed)
Pt transferred to purple zone, room 51. Pt in purple scrubs and wanded by security. Belonging inventoried and placed in locker #5. Awaiting TTS eval.

## 2023-01-15 NOTE — ED Notes (Addendum)
After speaking with EDP patient asked for nausea meds and states she is willing to stay and see how she feels in the morning; Pt also states she only willing to go somewhere [place]Monique,RN

## 2023-01-15 NOTE — ED Notes (Signed)
TTS in progress 

## 2023-01-15 NOTE — ED Notes (Signed)
Patient came to the desk and made phone call to friend/family; pt asked if she could get her clothes so could go and she had someone coming to pick her up; Patient advised that EDP will be notified of request and come talk with her; Pt waited 5 minutes and came back to desk and states that the Clonidine given to her earlier is making her "dizzy" to the point she does not know if she can leave. Pt was advised of disp for recommended psy tx. RN asked patient if she wanted help or not which has nothing to do with her being dizzy at the moment; Pt became defensive and states she wanted to wait til the morning to leave; pt advised that that's not how this works she can either except the tx or I will have EDP come to discharge her as she is voluntary; Pt attempted to say RN was holding her against her will; RN had sitter, AJ to stand by for witness as RN explains the process that EDP will come to assess and do discharge and RN will be more than happy to get her things so she can leave. EDP notified of patient's behavior and request.-Monique,RN

## 2023-01-15 NOTE — Progress Notes (Signed)
LCSW Progress Note  [id number]   Kristina Hardy  01/15/2023  7:04 PM    Inpatient Behavioral Health Placement  Pt meets inpatient criteria per Phebe Colla, NP. There are no available beds [facility] per [facility] Kelly Southard,RN. Referral was sent to the following facilities;   Destination  Service Provider Address Phone Fax  [facility]  [address] [place] [address] [phone]  [facility]  [address] [place] [address] [phone]  [facility]  [address] [place] [address] [phone]  [facility] [place]  [address] [place], [place] [address] [phone]  [facility]  [address] [place], [place] [address] [phone]  [facility]  [address], [place] [address] [phone]  [facility]  [address] [place], [place] [address] [phone]  [facility]  [address] [place] [address] [phone]  [facility]  [address] [place] [address] [phone]  [facility]  [address] [place] [address] [phone]  [facility]  [address], [place] [address] [phone]  [facility]  [address] [place] [address] [phone]  [facility] BED Management Behavioral Health  [place] [phone]  [facility] EFA[facility]  [address] [place], [place] [phone]  [facility]  [address], [place] [address] [phone]  [facility]  [address],  [place] [address] [phone]  [facility]  [address] [place], [place] [address] [phone]  [facility]  [address], [place] [address] [phone]  [facility] Psychiat[facility] Inpatient [place]  [place] [phone]  [facility]  [address] [place] [address] [phone]  [facility]  [address], [place] [address] [phone]  [facility]  [address] [place], [place] [address] [phone]  [facility]  [address], [place] [address] [phone]  [facility]  [address], [place] [address] [phone]  [facility]  [address] [place] [address] [phone]  [facility]  [address] [place] [address] [phone]  [facility] [address]  [place] [address] [phone]    Situation ongoing,  CSW will follow up.    Maryjean Ka, MSW, LCSWA 01/15/2023 7:04 PM
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
## 2023-01-16 ENCOUNTER — Inpatient Hospital Stay (HOSPITAL_COMMUNITY)
Admission: EM | Admit: 2023-01-16 | Discharge: 2023-01-24 | DRG: 885 | Disposition: A | Discharge status: Home / Self Care | Payer: MEDICAID | Source: Intra-hospital | Attending: Psychiatry | Admitting: Psychiatry

## 2023-01-16 DIAGNOSIS — Z886 Allergy status to analgesic agent status: Secondary | ICD-10-CM

## 2023-01-16 DIAGNOSIS — F1310 Sedative, hypnotic or anxiolytic abuse, uncomplicated: Secondary | ICD-10-CM | POA: Diagnosis present

## 2023-01-16 DIAGNOSIS — J029 Acute pharyngitis, unspecified: Secondary | ICD-10-CM | POA: Diagnosis not present

## 2023-01-16 DIAGNOSIS — F332 Major depressive disorder, recurrent severe without psychotic features: Secondary | ICD-10-CM | POA: Diagnosis present

## 2023-01-16 DIAGNOSIS — F419 Anxiety disorder, unspecified: Secondary | ICD-10-CM | POA: Diagnosis present

## 2023-01-16 DIAGNOSIS — Z630 Problems in relationship with spouse or partner: Secondary | ICD-10-CM | POA: Diagnosis not present

## 2023-01-16 DIAGNOSIS — Z885 Allergy status to narcotic agent status: Secondary | ICD-10-CM

## 2023-01-16 DIAGNOSIS — F22 Delusional disorders: Secondary | ICD-10-CM | POA: Diagnosis present

## 2023-01-16 DIAGNOSIS — F331 Major depressive disorder, recurrent, moderate: Secondary | ICD-10-CM | POA: Diagnosis not present

## 2023-01-16 DIAGNOSIS — Z560 Unemployment, unspecified: Secondary | ICD-10-CM

## 2023-01-16 DIAGNOSIS — M549 Dorsalgia, unspecified: Secondary | ICD-10-CM | POA: Diagnosis present

## 2023-01-16 DIAGNOSIS — F330 Major depressive disorder, recurrent, mild: Secondary | ICD-10-CM | POA: Diagnosis not present

## 2023-01-16 DIAGNOSIS — Z79899 Other long term (current) drug therapy: Secondary | ICD-10-CM

## 2023-01-16 DIAGNOSIS — F329 Major depressive disorder, single episode, unspecified: Principal | ICD-10-CM | POA: Diagnosis present

## 2023-01-16 DIAGNOSIS — R519 Headache, unspecified: Secondary | ICD-10-CM | POA: Diagnosis not present

## 2023-01-16 DIAGNOSIS — F1120 Opioid dependence, uncomplicated: Secondary | ICD-10-CM | POA: Diagnosis present

## 2023-01-16 DIAGNOSIS — F1510 Other stimulant abuse, uncomplicated: Secondary | ICD-10-CM | POA: Diagnosis present

## 2023-01-16 DIAGNOSIS — R45851 Suicidal ideations: Secondary | ICD-10-CM | POA: Diagnosis present

## 2023-01-16 DIAGNOSIS — S51812A Laceration without foreign body of left forearm, initial encounter: Secondary | ICD-10-CM | POA: Diagnosis present

## 2023-01-16 DIAGNOSIS — G8929 Other chronic pain: Secondary | ICD-10-CM | POA: Diagnosis present

## 2023-01-16 DIAGNOSIS — F17210 Nicotine dependence, cigarettes, uncomplicated: Secondary | ICD-10-CM | POA: Diagnosis present

## 2023-01-16 DIAGNOSIS — Z5902 Unsheltered homelessness: Secondary | ICD-10-CM | POA: Diagnosis not present

## 2023-01-16 DIAGNOSIS — X58XXXA Exposure to other specified factors, initial encounter: Secondary | ICD-10-CM | POA: Diagnosis present

## 2023-01-16 DIAGNOSIS — Z888 Allergy status to other drugs, medicaments and biological substances status: Secondary | ICD-10-CM | POA: Diagnosis not present

## 2023-01-16 MED ORDER — NICOTINE 21 MG/24HR TD PT24
21.0000 mg | MEDICATED_PATCH | Freq: Every day | TRANSDERMAL | Status: DC
  Administered 2023-01-17 – 2023-01-24 (×8): 21 mg via TRANSDERMAL
  Filled 2023-01-16 (×11): qty 1

## 2023-01-16 MED ORDER — HALOPERIDOL 5 MG PO TABS: 5.0000 mg | ORAL_TABLET | Freq: Three times a day (TID) | ORAL | Status: DC | PRN

## 2023-01-16 MED ORDER — BUPRENORPHINE HCL-NALOXONE HCL 8-2 MG SL SUBL
1.0000 | SUBLINGUAL_TABLET | Freq: Every day | SUBLINGUAL | Status: DC
  Administered 2023-01-17 – 2023-01-24 (×8): 1 via SUBLINGUAL
  Filled 2023-01-16 (×8): qty 1

## 2023-01-16 MED ORDER — LORAZEPAM 1 MG PO TABS: 2.0000 mg | ORAL_TABLET | Freq: Three times a day (TID) | ORAL | Status: DC | PRN

## 2023-01-16 MED ORDER — DIPHENHYDRAMINE HCL 50 MG/ML IJ SOLN: 50.0000 mg | Freq: Three times a day (TID) | INTRAMUSCULAR | Status: DC | PRN

## 2023-01-16 MED ORDER — HALOPERIDOL LACTATE 5 MG/ML IJ SOLN: 5.0000 mg | Freq: Three times a day (TID) | INTRAMUSCULAR | Status: DC | PRN

## 2023-01-16 MED ORDER — RISPERIDONE 1 MG PO TABS
1.0000 mg | ORAL_TABLET | Freq: Every day | ORAL | Status: DC
  Administered 2023-01-16 – 2023-01-17 (×2): 1 mg via ORAL
  Filled 2023-01-16 (×5): qty 1

## 2023-01-16 MED ORDER — PANTOPRAZOLE SODIUM 40 MG PO TBEC
40.0000 mg | DELAYED_RELEASE_TABLET | Freq: Every day | ORAL | Status: DC
  Administered 2023-01-17 – 2023-01-24 (×8): 40 mg via ORAL
  Filled 2023-01-16 (×11): qty 1

## 2023-01-16 MED ORDER — DIPHENHYDRAMINE HCL 25 MG PO CAPS: 50.0000 mg | ORAL_CAPSULE | Freq: Three times a day (TID) | ORAL | Status: DC | PRN

## 2023-01-16 MED ORDER — TRAZODONE HCL 50 MG PO TABS
50.0000 mg | ORAL_TABLET | Freq: Every evening | ORAL | Status: DC | PRN
  Administered 2023-01-16 – 2023-01-23 (×3): 50 mg via ORAL
  Filled 2023-01-16 (×5): qty 1

## 2023-01-16 MED ORDER — ALUM & MAG HYDROXIDE-SIMETH 200-200-20 MG/5ML PO SUSP: 30.0000 mL | ORAL | Status: DC | PRN

## 2023-01-16 MED ORDER — DOXYCYCLINE HYCLATE 100 MG PO TABS
100.0000 mg | ORAL_TABLET | Freq: Two times a day (BID) | ORAL | Status: AC
  Administered 2023-01-16 – 2023-01-21 (×10): 100 mg via ORAL
  Filled 2023-01-16 (×11): qty 1

## 2023-01-16 MED ORDER — MAGNESIUM HYDROXIDE 400 MG/5ML PO SUSP: 30.0000 mL | Freq: Every day | ORAL | Status: DC | PRN

## 2023-01-16 MED ORDER — HYDROXYZINE HCL 25 MG PO TABS
25.0000 mg | ORAL_TABLET | Freq: Three times a day (TID) | ORAL | Status: DC | PRN
  Administered 2023-01-16 – 2023-01-23 (×4): 25 mg via ORAL
  Filled 2023-01-16 (×5): qty 1

## 2023-01-16 MED ORDER — CLONIDINE HCL 0.1 MG PO TABS
0.1000 mg | ORAL_TABLET | Freq: Three times a day (TID) | ORAL | Status: DC
  Filled 2023-01-16 (×11): qty 1

## 2023-01-16 MED ORDER — LORAZEPAM 2 MG/ML IJ SOLN: 2.0000 mg | Freq: Three times a day (TID) | INTRAMUSCULAR | Status: DC | PRN
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
## 2023-01-16 NOTE — Plan of Care (Signed)
  Problem: Education: Goal: Knowledge of [place] General Education information/materials will improve Outcome: Not Progressing Goal: Emotional status will improve Outcome: Not Progressing Goal: Mental status will improve Outcome: Not Progressing Goal: Verbalization of understanding the information provided will improve Outcome: Not Progressing   Problem: Activity: Goal: Interest or engagement in activities will improve Outcome: Not Progressing   Problem: Coping: Goal: Ability to verbalize frustrations and anger appropriately will improve Outcome: Not Progressing Goal: Ability to demonstrate self-control will improve Outcome: Not Progressing

## 2023-01-16 NOTE — Group Note (Signed)
Date:  01/16/2023 Time:  11:27 PM  Group Topic/Focus:  Wrap-Up Group:   The focus of this group is to help patients review their daily goal of treatment and discuss progress on daily workbooks.    Participation Level:  Active  Participation Quality:  Appropriate and Sharing  Affect:  Appropriate  Cognitive:  Appropriate  Insight: Appropriate and Limited  Engagement in Group:  Engaged  Modes of Intervention:  Socialization  Additional Comments:  The patient stated that she had a "good day". The patient shared that it was her first day on the unit and didn't give much insight on her day. The patient rated her day a 5/10. The patient participated in the group activity.   Kennieth Francois 01/16/2023, 11:27 PM

## 2023-01-16 NOTE — ED Notes (Signed)
Pt ambulated to safe transport w/ steady gait. All belongings and paperwork given to safe transport. Pt calm, cooperative, VSS upon departure.

## 2023-01-16 NOTE — ED Provider Notes (Signed)
Patient eating lunch, no distress, accepted to behavioral health.   Gerhard Munch, MD 01/16/23 1400

## 2023-01-16 NOTE — Progress Notes (Signed)
Pt was accepted to [facility] TODAY 01/16/2023; Bed Assignment Room 303-1.  -Per ED nursing Denton Ar, RN consent has been uploaded to pt's chart.  Pt meets inpatient criteria per Madelin Rear  Attending Physician will be Dr. Phineas Inches, MD  Report can be called to: - Adult unit: [phone]  Pt can arrive after: 1345  Care Team notified: Day [facility] [address], Mikaela Harland Dingwall, RN    [place], [place] 01/16/2023 @ 1:20 PM

## 2023-01-16 NOTE — ED Notes (Signed)
Pt asking for her suboxone multiple times. Explained that this RN is going to get meds together for her including suboxone.

## 2023-01-16 NOTE — ED Notes (Signed)
Notified Diplomatic Services operational officer of need to call transport to take pt to [facility].

## 2023-01-16 NOTE — ED Notes (Addendum)
Notified pt that I would give her the nicotine patch after she showers so it doesn't fall off. Pt is planning on taking a shower shortly.

## 2023-01-16 NOTE — Progress Notes (Signed)
Pt was accepted to [facility] TODAY 01/16/2023; Bed Assignment PENDING signed voluntary consent uploaded to pt's chart or faxed to [facility] [phone]  Pt meets inpatient criteria per Madelin Rear  Attending Physician will be Dr. Phineas Inches, MD  Report can be called to: Adult unit: [phone]  Pt can arrive after: [facility] to coordinate with care team.  Care Team notified: Day [facility] Lona Kettle, Mikaela Gresham, [place], RN   [place], [place] 01/16/2023 @ 12:49 PM
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
## 2023-01-17 DIAGNOSIS — F331 Major depressive disorder, recurrent, moderate: Secondary | ICD-10-CM | POA: Diagnosis not present

## 2023-01-17 NOTE — Progress Notes (Signed)
   01/17/23 1100  Psych Admission Type (Psych Patients Only)  Admission Status Voluntary  Psychosocial Assessment  Patient Complaints Anxiety  Eye Contact Fair  Facial Expression Anxious  Affect Appropriate to circumstance  Speech Logical/coherent  Interaction Assertive  Motor Activity Fidgety  Appearance/Hygiene Unremarkable  Behavior Characteristics Cooperative;Anxious  Mood Anxious  Thought Process  Coherency WDL  Content WDL  Delusions None reported or observed  Perception WDL  Hallucination None reported or observed  Judgment WDL  Confusion WDL  Danger to Self  Current suicidal ideation? Denies  Agreement Not to Harm Self Yes  Description of Agreement agreed to contact staff before acting on harmful thoughts  Danger to Others  Danger to Others None reported or observed

## 2023-01-17 NOTE — [facility] Suicide Risk Assessment (Signed)
[facility] INPATIENT:  Family/Significant Other Suicide Prevention Education  Suicide Prevention Education:  Education Completed; Kristina Hardy,  (Significant other [phone]) has been identified by the patient as the family member/significant other with whom the patient will be residing, and identified as the person(s) who will aid the patient in the event of a mental health crisis (suicidal ideations/suicide attempt).  With written consent from the patient, the family member/significant other has been provided the following suicide prevention education, prior to the and/or following the discharge of the patient.  The suicide prevention education provided includes the following: Suicide risk factors Suicide prevention and interventions National Suicide Hotline telephone number [facility] assessment telephone number [facility] Emergency Assistance 911 [facility] and/or Residential Mobile Crisis Unit telephone number  Request made of family/significant other to: Remove weapons (e.g., guns, rifles, knives), all items previously/currently identified as safety concern.   Remove drugs/medications (over-the-counter, prescriptions, illicit drugs), all items previously/currently identified as a safety concern.  The family member/significant other verbalizes understanding of the suicide prevention education information provided.  The family member/significant other agrees to remove the items of safety concern listed above.  Kristina Hardy Kristina Hardy 01/17/2023, 4:00 PM

## 2023-01-17 NOTE — Plan of Care (Signed)
Problem: Education: Goal: Knowledge of [facility] Education information/materials will improve Outcome: Progressing Goal: Emotional status will improve Outcome: Progressing Goal: Verbalization of understanding the information provided will improve Outcome: Progressing

## 2023-01-17 NOTE — [facility] Group Notes (Signed)
[facility] Group Notes:  (Nursing/MHT/Case Management/Adjunct)  Date:  01/17/2023  Time:  9:57 PM  Type of Therapy:   Wrap-up group  Participation Level:  Active  Participation Quality:  Appropriate  Affect:  Appropriate  Cognitive:  Appropriate  Insight:  Appropriate  Engagement in Group:  Engaged  Modes of Intervention:  Education  Summary of Progress/Problems: Pt goal to cope with her anxiety. Rated day 6/10.  Noah Delaine 01/17/2023, 9:57 PM

## 2023-01-17 NOTE — [facility] Suicide Risk Assessment (Signed)
[facility] Admission Suicide Risk Assessment   Nursing information obtained from:    Demographic factors:    Current Mental Status:    Loss Factors:    Historical Factors:    Risk Reduction Factors:     Total Time spent with patient: 30 minutes Principal Problem: MDD (major depressive disorder) Diagnosis:  Principal Problem:   MDD (major depressive disorder)  Subjective Data: The patient is a 52 year old Caucasian female admitted with symptoms of depression, suicidal ideations, vague paranoia in the in the context of substance use disorder and homelessness.  Continued Clinical Symptoms:    The "Alcohol Use Disorders Identification Test", Guidelines for Use in Primary Care, Second Edition.  World Science writer [facility]). Score between 0-7:  no or low risk or alcohol related problems. Score between 8-15:  moderate risk of alcohol related problems. Score between 16-19:  high risk of alcohol related problems. Score 20 or above:  warrants further diagnostic evaluation for alcohol dependence and treatment.   CLINICAL FACTORS:   Severe Anxiety and/or Agitation Depression:   Impulsivity Alcohol/Substance Abuse/Dependencies Personality Disorders:   Cluster B Unstable or Poor Therapeutic Relationship   Musculoskeletal: Strength & Muscle Tone: within normal limits Gait & Station: normal Patient leans: N/A  Psychiatric Specialty Exam:  Presentation  General Appearance:  Appropriate for Environment  Eye Contact: Fair  Speech: Clear and Coherent  Speech Volume: Normal  Handedness: Right   Mood and Affect  Mood: Anxious  Affect: Congruent   Thought Process  Thought Processes: Goal Directed  Descriptions of Associations:Intact  Orientation:Full (Time, Place and Person)  Thought Content:Logical  History of Schizophrenia/Schizoaffective disorder:No  Duration of Psychotic Symptoms:Greater than six months  Hallucinations:Hallucinations: None  Ideas of  Reference:None  Suicidal Thoughts:Suicidal Thoughts: No  Homicidal Thoughts:Homicidal Thoughts: No   Sensorium  Memory: Immediate Fair; Recent Fair  Judgment: Fair  Insight: Fair   Art therapist  Concentration: Fair  Attention Span: Fair  Recall: [employer] of Knowledge: Fair  Language: Fair   Psychomotor Activity  Psychomotor Activity: Psychomotor Activity: Normal   Assets  Assets: Desire for Improvement; Physical Health   Sleep  Sleep: Sleep: Fair    Physical Exam: Physical Exam Constitutional:      Appearance: Normal appearance.  HENT:     Head: Normocephalic.  Neurological:     General: No focal deficit present.     Mental Status: She is alert and oriented to person, place, and time. Mental status is at baseline.    Review of Systems  Psychiatric/Behavioral:  Positive for depression, substance abuse and suicidal ideas. The patient is nervous/anxious.   All other systems reviewed and are negative.  Blood pressure 111/86, pulse (!) 122, temperature 98.2 F (36.8 C), temperature source Oral, resp. rate 14, height 5\' 2"  (1.575 m), weight 44.9 kg, last menstrual period 11/17/2011, SpO2 97%. Body mass index is 18.11 kg/m.   COGNITIVE FEATURES THAT CONTRIBUTE TO RISK:  Polarized thinking and Thought constriction (tunnel vision)    SUICIDE RISK:   Moderate:  Frequent suicidal ideation with limited intensity, and duration, some specificity in terms of plans, no associated intent, good self-control, limited dysphoria/symptomatology, some risk factors present, and identifiable protective factors, including available and accessible social support.  PLAN OF CARE: The patient is admitted to the inpatient services in a safe and secure environment for further observation and diagnostic clarification.  She will be closely monitored for any emerging symptoms of psychosis, suicidal ideations and withdrawals.  She is contracting for safety.  I  certify that inpatient services furnished can reasonably be expected to improve the patient's condition.   [name], MD 01/17/2023, 11:11 AM

## 2023-01-17 NOTE — Plan of Care (Signed)
  Problem: Education: Goal: Knowledge of [facility] Education information/materials will improve Outcome: Progressing Goal: Emotional status will improve Outcome: Progressing Goal: Mental status will improve Outcome: Progressing Goal: Verbalization of understanding the information provided will improve Outcome: Progressing   
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
## 2023-01-17 NOTE — [facility] Group Notes (Signed)
Type of Therapy and Topic:  Group Therapy:Gratitude  Participation Level:  Did Not Attend   Description of Group:   In this group, patients shared and discussed the importance of acknowledging the elements in their lives for which they are grateful and how this can positively impact their mood.  The group discussed how bringing the positive elements of their lives to the forefront of their minds can help with recovery from any illness, physical or mental.  An exercise was done as a group in which a list was made of gratitude items in order to encourage participants to consider other potential positives in their lives.  Therapeutic Goals: Patients will identify one or more item for which they are grateful in each of 6 categories:  people, experiences, things, places, skills, and other. Patients will discuss how it is possible to seek out gratitude in even bad situations. Patients will explore other possible items of gratitude that they could remember.   Summary of Patient Progress: NA  Therapeutic Modalities:   Solution-Focused Therapy Activity

## 2023-01-18 ENCOUNTER — Other Ambulatory Visit (HOSPITAL_COMMUNITY): Payer: Self-pay

## 2023-01-18 ENCOUNTER — Encounter (HOSPITAL_COMMUNITY): Payer: Self-pay

## 2023-01-18 DIAGNOSIS — F331 Major depressive disorder, recurrent, moderate: Secondary | ICD-10-CM | POA: Diagnosis not present

## 2023-01-18 LAB — LIPID PANEL
Cholesterol: 197 mg/dL (ref 0–200)
HDL: 70 mg/dL (ref >40)
LDL Cholesterol: 111 mg/dL — ABNORMAL HIGH (ref 0–99)
Total CHOL/HDL Ratio: 2.8 {ratio}
Triglycerides: 80 mg/dL (ref <150)
VLDL: 16 mg/dL (ref 0–40)

## 2023-01-18 LAB — HEMOGLOBIN A1C
Hgb A1c MFr Bld: 5.5 % (ref 4.8–5.6)
Mean Plasma Glucose: 111.15 mg/dL

## 2023-01-18 MED ORDER — DULOXETINE HCL 30 MG PO CPEP
30.0000 mg | ORAL_CAPSULE | Freq: Every day | ORAL | Status: DC
  Administered 2023-01-18 – 2023-01-22 (×5): 30 mg via ORAL
  Filled 2023-01-18 (×8): qty 1

## 2023-01-18 MED ORDER — NICOTINE POLACRILEX 2 MG MT GUM
2.0000 mg | CHEWING_GUM | OROMUCOSAL | Status: DC | PRN
  Administered 2023-01-19 – 2023-01-24 (×6): 2 mg via ORAL
  Filled 2023-01-18 (×3): qty 1
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
## 2023-01-18 NOTE — Progress Notes (Signed)
D) Pt received calm, visible, participating in milieu, and in no acute distress. Pt A & O x4. Pt denies SI, HI, A/ V H, depression, anxiety and pain at this time. A) Pt encouraged to drink fluids. Pt encouraged to come to staff with needs. Pt encouraged to attend and participate in groups. Pt encouraged to set reachable goals.  R) Pt remained safe on unit, in no acute distress, will continue to assess.     01/18/23 0000  Psych Admission Type (Psych Patients Only)  Admission Status Voluntary  Psychosocial Assessment  Patient Complaints Anxiety  Eye Contact Fair  Facial Expression Anxious  Affect Appropriate to circumstance  Speech Logical/coherent  Interaction Assertive  Motor Activity Fidgety  Appearance/Hygiene Unremarkable  Behavior Characteristics Anxious;Cooperative  Mood Anxious  Thought Process  Coherency WDL  Content WDL  Delusions None reported or observed  Perception WDL  Hallucination None reported or observed  Judgment WDL  Confusion WDL  Danger to Self  Current suicidal ideation? Denies  Agreement Not to Harm Self Yes  Description of Agreement verbal  Danger to Others  Danger to Others None reported or observed

## 2023-01-18 NOTE — [facility] Group Notes (Signed)

## 2023-01-18 NOTE — Group Note (Signed)
Date:  01/18/2023 Time:  11:35 AM  Group Topic/Focus:  Dimensions of Wellness:   The focus of this group is to introduce the topic of wellness and discuss the role each dimension of wellness plays in total health.    Participation Level:  Did Not Attend  Participation Quality:      Affect:      Cognitive:      Insight: None  Engagement in Group:      Modes of Intervention:      Additional Comments:    Beckie Busing 01/18/2023, 11:35 AM

## 2023-01-18 NOTE — Group Note (Signed)
Date:  01/18/2023 Time:  10:47 AM  Group Topic/Focus:  Goals Group:   The focus of this group is to help patients establish daily goals to achieve during treatment and discuss how the patient can incorporate goal setting into their daily lives to aide in recovery.    Participation Level:  Did Not Attend  Participation Quality:   NA  Affect:   NA  Cognitive:   NA  Insight: None  Engagement in Group:   Na  Modes of Intervention:   Na  Additional Comments:    Beckie Busing 01/18/2023, 10:47 AM

## 2023-01-18 NOTE — Group Note (Signed)
Recreation Therapy Group Note   Group Topic:Stress Management  Group Date: 01/18/2023 Start Time: 0935 End Time: 1001 Facilitators: Luccas Towell-McCall, LRT,CTRS Location: [address] Dayroom   Group Topic: Stress Management  Goal Area(s) Addresses:  Patient will identify positive stress management techniques. Patient will identify benefits of using stress management post d/c.  Group Description: Meditation. LRT played a meditation that focused on being present, living in gratitude and bringing joy into your day.     Education: Stress Management, Discharge Planning.   Education Outcome: Acknowledges Education   Affect/Mood: N/A   Participation Level: Did not attend    Clinical Observations/Individualized Feedback:     Plan: Continue to engage patient in RT group sessions 2-3x/week.   Chenille Toor-McCall, LRT,CTRS  01/18/2023 12:07 PM

## 2023-01-18 NOTE — Plan of Care (Signed)

## 2023-01-18 NOTE — BH IP Treatment Plan (Signed)
Interdisciplinary Treatment and Diagnostic Plan Update  01/18/2023 Time of Session: 10:35am Kristina Hardy MRN: [id number]  Principal Diagnosis: MDD (major depressive disorder)  Secondary Diagnoses: Principal Problem:   MDD (major depressive disorder)   Current Medications:  Current Facility-Administered Medications  Medication Dose Route Frequency Provider Last Rate Last Admin   alum & mag hydroxide-simeth (MAALOX/MYLANTA) 200-200-20 MG/5ML suspension 30 mL  30 mL Oral Q4H PRN [name], NP       buprenorphine-naloxone (SUBOXONE) 8-2 mg per SL tablet 1 tablet  1 tablet Sublingual Daily [name], NP   1 tablet at 01/18/23 4098   cloNIDine (CATAPRES) tablet 0.1 mg  0.1 mg Oral TID [name], NP       diphenhydrAMINE (BENADRYL) capsule 50 mg  50 mg Oral TID PRN [name], NP       Or   diphenhydrAMINE (BENADRYL) injection 50 mg  50 mg Intramuscular TID PRN [name], NP       doxycycline (VIBRA-TABS) tablet 100 mg  100 mg Oral Q12H [name], NP   100 mg at 01/18/23 1191   DULoxetine (CYMBALTA) DR capsule 30 mg  30 mg Oral Daily [name], MD   30 mg at 01/18/23 1148   haloperidol (HALDOL) tablet 5 mg  5 mg Oral TID PRN [name], NP       Or   haloperidol lactate (HALDOL) injection 5 mg  5 mg Intramuscular TID PRN [name], NP       hydrOXYzine (ATARAX) tablet 25 mg  25 mg Oral TID PRN [name], NP   25 mg at 01/18/23 1106   LORazepam (ATIVAN) tablet 2 mg  2 mg Oral TID PRN [name], NP       Or   LORazepam (ATIVAN) injection 2 mg  2 mg Intramuscular TID PRN [name], NP       magnesium hydroxide (MILK OF MAGNESIA) suspension 30 mL  30 mL Oral Daily PRN [name], NP       nicotine (NICODERM CQ - dosed in mg/24 hours) patch 21 mg  21 mg Transdermal Daily [name], NP   21 mg at 01/18/23 4782   nicotine polacrilex (NICORETTE) gum 2 mg  2 mg Oral PRN [name], MD        pantoprazole (PROTONIX) EC tablet 40 mg  40 mg Oral Daily [name], NP   40 mg at 01/18/23 0823   risperiDONE (RISPERDAL) tablet 1 mg  1 mg Oral QHS [name], NP   1 mg at 01/17/23 2119   traZODone (DESYREL) tablet 50 mg  50 mg Oral QHS PRN [name], NP   50 mg at 01/17/23 2119   PTA Medications: Medications Prior to Admission  Medication Sig Dispense Refill Last Dose   Buprenorphine HCl-Naloxone HCl 8-2 MG FILM Place 1-2.5 Film under the tongue daily.      cephALEXin (KEFLEX) 500 MG capsule Take 500 mg by mouth 4 (four) times daily.      cloNIDine (CATAPRES) 0.1 MG tablet Take 0.1 mg by mouth 3 (three) times daily as needed (anxiety).      fluticasone (FLONASE) 50 MCG/ACT nasal spray Place 1 spray into both nostrils daily. (Patient not taking: Reported on 01/15/2023) 16 g 2    ibuprofen (ADVIL) 200 MG tablet Take 400-800 mg by mouth 2 (two) times daily as needed for moderate pain.      omeprazole (PRILOSEC) 20 MG capsule Take 1 capsule (20 mg total) by mouth daily. (Patient  not taking: Reported on 01/15/2023) 30 capsule 0    ondansetron (ZOFRAN-ODT) 8 MG disintegrating tablet Take 8 mg by mouth 2 (two) times daily as needed for nausea or vomiting.      risperiDONE (RISPERDAL) 1 MG tablet Take 1 mg by mouth at bedtime as needed (mood).       Patient Stressors:    Patient Strengths:    Treatment Modalities: Medication Management, Group therapy, Case management,  1 to 1 session with clinician, Psychoeducation, Recreational therapy.   Physician Treatment Plan for Primary Diagnosis: MDD (major depressive disorder) Long Term Goal(s): Improvement in symptoms so as ready for discharge   Short Term Goals: Ability to identify changes in lifestyle to reduce recurrence of condition will improve Ability to demonstrate self-control will improve Ability to identify and develop effective coping behaviors will improve Compliance with prescribed medications will improve Ability  to identify triggers associated with substance abuse/mental health issues will improve Ability to disclose and discuss suicidal ideas  Medication Management: Evaluate patient's response, side effects, and tolerance of medication regimen.  Therapeutic Interventions: 1 to 1 sessions, Unit Group sessions and Medication administration.  Evaluation of Outcomes: Progressing  Physician Treatment Plan for Secondary Diagnosis: Principal Problem:   MDD (major depressive disorder)  Long Term Goal(s): Improvement in symptoms so as ready for discharge   Short Term Goals: Ability to identify changes in lifestyle to reduce recurrence of condition will improve Ability to demonstrate self-control will improve Ability to identify and develop effective coping behaviors will improve Compliance with prescribed medications will improve Ability to identify triggers associated with substance abuse/mental health issues will improve Ability to disclose and discuss suicidal ideas     Medication Management: Evaluate patient's response, side effects, and tolerance of medication regimen.  Therapeutic Interventions: 1 to 1 sessions, Unit Group sessions and Medication administration.  Evaluation of Outcomes: Progressing   RN Treatment Plan for Primary Diagnosis: MDD (major depressive disorder) Long Term Goal(s): Knowledge of disease and therapeutic regimen to maintain health will improve  Short Term Goals: Ability to remain free from injury will improve, Ability to verbalize frustration and anger appropriately will improve, Ability to participate in decision making will improve, Ability to verbalize feelings will improve, Ability to identify and develop effective coping behaviors will improve, and Compliance with prescribed medications will improve  Medication Management: RN will administer medications as ordered by provider, will assess and evaluate patient's response and provide education to patient for  prescribed medication. RN will report any adverse and/or side effects to prescribing provider.  Therapeutic Interventions: 1 on 1 counseling sessions, Psychoeducation, Medication administration, Evaluate responses to treatment, Monitor vital signs and CBGs as ordered, Perform/monitor CIWA, COWS, AIMS and Fall Risk screenings as ordered, Perform wound care treatments as ordered.  Evaluation of Outcomes: Progressing   LCSW Treatment Plan for Primary Diagnosis: MDD (major depressive disorder) Long Term Goal(s): Safe transition to appropriate next level of care at discharge, Engage patient in therapeutic group addressing interpersonal concerns.  Short Term Goals: Engage patient in aftercare planning with referrals and resources, Increase social support, Increase emotional regulation, Facilitate acceptance of mental health diagnosis and concerns, Identify triggers associated with mental health/substance abuse issues, and Increase skills for wellness and recovery  Therapeutic Interventions: Assess for all discharge needs, 1 to 1 time with Social worker, Explore available resources and support systems, Assess for adequacy in community support network, Educate family and significant other(s) on suicide prevention, Complete Psychosocial Assessment, Interpersonal group therapy.  Evaluation of Outcomes: Progressing  Progress in Treatment: Attending groups: No. Participating in groups: No. Taking medication as prescribed: Yes. Toleration medication: Yes. Family/Significant other contact made: Yes, individual(s) contacted:  Norm Salt (bf [phone]) Patient understands diagnosis: Yes. Discussing patient identified problems/goals with staff: Yes. Medical problems stabilized or resolved: Yes. Denies suicidal/homicidal ideation: Yes. Issues/concerns per patient self-inventory: No.  New problem(s) identified: No, Describe:  none reported  New Short Term/Long Term Goal(s):medication stabilization,  elimination of SI thoughts, development of comprehensive mental wellness plan.    Patient Goals:  "Stay clean and not be so depressed."  Discharge Plan or Barriers: Patient recently admitted. CSW will continue to follow and assess for appropriate referrals and possible discharge planning.    Reason for Continuation of Hospitalization: Depression Medication stabilization Suicidal ideation  Estimated Length of Stay:5-7 days   Last 3 [place] Suicide Severity Risk Score: Flowsheet Row Ad[facility] (Current) [facility] from [facility]14/2023 in [facility] ED from 01/07/2023 in [facility] at [facility]  C-SSRS RISK CATEGORY No Risk No Risk No Risk       Last [facility] 2/9 Scores:     No data to display          Scribe for Treatment Team: Izell Webb, LCSW 01/18/2023 1:18 PM

## 2023-01-18 NOTE — [facility] Group Notes (Signed)
[facility] Group Notes:  (Nursing/MHT/Case Management/Adjunct)  Date:  01/18/2023  Time:  9:25 PM  Type of Therapy:  Group Therapy  Participation Level:  Minimal  Participation Quality:  Appropriate  Affect:  Appropriate  Cognitive:  Appropriate  Insight:  Limited  Engagement in Group:  Limited  Modes of Intervention:  Education  Summary of Progress/Problems: The patient attended the evening A.A. meeting and was appropriate.   Hazle Coca S 01/18/2023, 9:25 PM

## 2023-01-19 DIAGNOSIS — F331 Major depressive disorder, recurrent, moderate: Secondary | ICD-10-CM | POA: Diagnosis not present

## 2023-01-19 MED ORDER — ONDANSETRON 4 MG PO TBDP
4.0000 mg | ORAL_TABLET | Freq: Three times a day (TID) | ORAL | Status: DC | PRN
  Administered 2023-01-19: 4 mg via ORAL
  Filled 2023-01-19: qty 1

## 2023-01-19 MED ORDER — CLONIDINE HCL 0.1 MG PO TABS: 0.1000 mg | ORAL_TABLET | Freq: Three times a day (TID) | ORAL | Status: DC | PRN

## 2023-01-19 MED ORDER — IBUPROFEN 400 MG PO TABS
400.0000 mg | ORAL_TABLET | ORAL | Status: AC | PRN
  Administered 2023-01-19 – 2023-01-21 (×2): 400 mg via ORAL
  Filled 2023-01-19 (×2): qty 1

## 2023-01-19 MED ORDER — ARIPIPRAZOLE 2 MG PO TABS
2.0000 mg | ORAL_TABLET | Freq: Every day | ORAL | Status: DC
  Administered 2023-01-19 – 2023-01-20 (×2): 2 mg via ORAL
  Filled 2023-01-19 (×5): qty 1

## 2023-01-19 NOTE — Group Note (Signed)
Recreation Therapy Group Note   Group Topic:Animal Assisted Therapy   Group Date: 01/19/2023 Start Time: 0954 End Time: 1030 Facilitators: Shondell Poulson-McCall, LRT,CTRS Location: [address] Dayroom   Animal-Assisted Activity (AAA) Program Checklist/Progress Notes Patient Eligibility Criteria Checklist & Daily Group note for Rec Tx Intervention  AAA/T Program Assumption of Risk Form signed by Patient/ or Parent Legal Guardian Yes  Patient is free of allergies or severe asthma Yes  Patient reports no fear of animals Yes  Patient reports no history of cruelty to animals Yes  Patient understands his/her participation is voluntary Yes  Patient washes hands before animal contact Yes  Patient washes hands after animal contact Yes  Education: Hand Washing, Appropriate Animal Interaction   Education Outcome: Acknowledges education.    Affect/Mood: Appropriate   Participation Level: Engaged   Participation Quality: Independent   Behavior: Appropriate   Speech/Thought Process: Focused   Insight: Good   Judgement: Good   Modes of Intervention: Teaching laboratory technician   Patient Response to Interventions:  Engaged   Education Outcome:  In group clarification offered    Clinical Observations/Individualized Feedback: Pt came for the last minutes of group. Pt asked some questions and had some interaction with therapy dog team.      Plan: Continue to engage patient in RT group sessions 2-3x/week.   Kristina Hardy, LRT,CTRS  01/19/2023 12:32 PM

## 2023-01-19 NOTE — Progress Notes (Addendum)
Patient presents with anxious mood affect congruent. Camyla reports continuing to feel ''groggy tired '' so she states '' so I didn't want to feel that way again today so I didn't take the risperdal and I don't want the clonidine either. '' Pt reports still feeling dizzy at times. Also of note, she con't to report arm pain from self inflicted laceration, and states '' I think it is getting infected. '' Noted swelling and when bandage removed noted purulent draining and edges appear puffy and red. Wound cleansed with alcohol and neosporin and clean bandage placed. Pt encouraged to let staff know after shower so dressing can be placed. Pt denies any SI HI but does report poor sleep. Pt has been visible in the milieu, attending programming. She con't to report feeling paranoid about leaving and high depression and anxiety. Above discussed with treatment team including MD, NP AND SW. Pt is safe, able to make needs known. Pt completed self inventory and rates her depression at 5/10, anxiety at 9/10 and her hopelessness at 6/10 on scale 10 being worst 0 being none. She con't to state she would like assistance with getting her studio apartment repairs completed. SW is aware and has given information. Will con't to monitor.  At 1500 pt reporting worsening malaise, stating she feels more nausea, pain and feeling '' unwell'' given snack and gingerale with no improvements VS obtained , all WDL. Pt reports concern she feels her arm laceration is making her sick. MD informed and assessed with Clinical research associate. Wound care consult placed per MD. Pt given zofran with good results and a second wound cleansing completed per MD request with saline and gauze. Pt is safe, will con't to monitor.

## 2023-01-19 NOTE — Progress Notes (Signed)
   01/19/23 6578  15 Minute Checks  Location Bedroom  Visual Appearance Calm  Behavior Composed  Sleep (Behavioral Health Patients Only)  Calculate sleep? (Click Yes once per 24 hr at 0600 safety check) Yes  Documented sleep last 24 hours 7.75

## 2023-01-19 NOTE — Plan of Care (Signed)

## 2023-01-19 NOTE — Progress Notes (Signed)
Pt reported increased dizziness after attending group. Checked pt's BP and BP was 108/78. Provided pt with rest and provided pt with fluids. Encouraged pt to increase fluid intake. Pt was given scheduled doxycycline. Pt declined scheduled risperidone due to pt expressing concern over her BP decreasing and increased somnolence with taking the medication.      01/18/23 2158  Psych Admission Type (Psych Patients Only)  Admission Status Voluntary  Psychosocial Assessment  Patient Complaints Anxiety  Eye Contact Brief  Facial Expression Flat  Affect Appropriate to circumstance  Speech Logical/coherent  Interaction Assertive  Motor Activity Slow  Appearance/Hygiene Unremarkable  Behavior Characteristics Cooperative  Mood Anxious  Thought Process  Coherency WDL  Content WDL  Delusions None reported or observed  Perception WDL  Hallucination None reported or observed  Judgment Impaired  Confusion WDL  Danger to Self  Current suicidal ideation? Denies  Agreement Not to Harm Self Yes  Description of Agreement verbal  Danger to Others  Danger to Others None reported or observed

## 2023-01-19 NOTE — Progress Notes (Signed)
Psychiatric progress note  Patient Identification: Kristina Hardy MRN:  [id number] Date of Evaluation:  01/19/2023 Chief Complaint:  MDD (major depressive disorder) [F32.9] Principal Diagnosis: MDD (major depressive disorder) Diagnosis:  Principal Problem:   MDD (major depressive disorder)  Reason for admission   The patient was admitted to [facility] adult inpatient unit on a voluntary status.  She was referred from the ED where she had a psych consult.  She was admitted for depression, passive suicidal ideations and possible psychosis.  This was in the context of substance use disorder and recent stress related to relationship.  Chart review from last 24 hours   Staff reports the patient has been feeling groggy and tired and somewhat dizzy.  Her blood pressure has been 118/84 with a pulse rate of 81.  She has refused the clonidine but has taken the rest of the medications as described she also refused the Risperdal at night.  She slept 8.5 hours. She has an old laceration on her left forearm that is apparently swollen and purulent.  This has been claimed with Neosporin and a Band-Aid has been placed. Yesterday, the psychiatry team made the following recommendations:  Continue home medications as prescribed. Continue Suboxone 8-2 mg daily Clonidine 0.1 mg 3 times daily Risperdal 1 mg at bedtime Evaluated for antidepressants.  Patient apparently cannot take SSRIs.  Information obtained during interview   The patient was seen and evaluated today and the chart was reviewed.  The patient remains alert oriented and partially cooperative.  She is initially presenting with a labile affect and endorses poor sleep and claims that the room temperature was very high.  She also continues to report no side effects on Cymbalta which she started yesterday.  She claims that her boyfriend is fixing up her apartment which may take an additional 2 to 3 weeks.  In the meantime she may have to look for resources  in the community.  Again like yesterday she pointed to her shoe and wanted to know if something was unusual in her shoe.  She claimed that the shoe looked like an open mouth and she had mentioned that same thing to the emergency room doctor.  She then rambled on about being frustrated because other people cannot see these things.  She also feels that part of the reason she feels tired may be from the infection on her forearm. She does contract for safety and denies any active suicidal ideations. The plan is to change her risperidone to Abilify since she is refusing the Risperdal.  We will continue the rest of her medications including the duloxetine that was started yesterday.  Associated Signs/Symptoms: Depression Symptoms:  depressed mood, psychomotor agitation, difficulty concentrating, suicidal thoughts without plan, loss of energy/fatigue, disturbed sleep, (Hypo) Manic Symptoms:  Distractibility, Impulsivity, Irritable Mood, Anxiety Symptoms:  Excessive Worry, Psychotic Symptoms:  Paranoia, PTSD Symptoms: Negative Total Time spent with patient: 30 minutes  Past Psychiatric History: Patient gives a positive history of depression and history of multiple contacts with the ED for any of the substance use disorder, back pain or suicidal ideations but claims that she was never hospitalized.  Is the patient at risk to self? Yes.    Has the patient been a risk to self in the past 6 months? Yes.    Has the patient been a risk to self within the distant past? Yes.    Is the patient a risk to others? No.  Has the patient been a risk to others in  the past 6 months? No.  Has the patient been a risk to others within the distant past? No.   [place] Scale:  Flowsheet Row Ad[facility] (Current) [facility] from 01/14/2023 in [facility] Department at [facility] ED from 01/07/2023 in [facility] Emergency Department at [facility]  C-SSRS RISK CATEGORY No Risk No Risk No Risk        Prior Inpatient Therapy: No. If yes, describe patient had multiple ED contacts but no hospitalization. Prior Outpatient Therapy: Yes.   If yes, describe patient has been noncompliant.  She does go to the Suboxone clinic for the last 2 years.  Alcohol Screening:   Substance Abuse History in the last 12 months:  Yes.   Consequences of Substance Abuse: Negative Previous Psychotropic Medications: Yes  Psychological Evaluations: No  Past Medical History:  Past Medical History:  Diagnosis Date   Anxiety    Chronic back pain    Depression    Endometriosis    Hepatitis C    Hyperthyroidism     Past Surgical History:  Procedure Laterality Date   RHINOPLASTY     TONSILLECTOMY     Family History: No family history on file. Family Psychiatric  History: No pertinent psychiatric history. Tobacco Screening:  Social History   Tobacco Use  Smoking Status Every Day   Current packs/day: 1.00   Types: Cigarettes  Smokeless Tobacco Not on file    BH Tobacco Counseling     Are you interested in Tobacco Cessation Medications?  No value filed. Counseled patient on smoking cessation:  No value filed. Reason Tobacco Screening Not Completed: No value filed.       Social History:  Social History   Substance and Sexual Activity  Alcohol Use No     Social History   Substance and Sexual Activity  Drug Use No   Comment: hx of opiod abuse    Additional Social History: Marital status: Single Are you sexually active?: No What is your sexual orientation?: henterosexual Has your sexual activity been affected by drugs, alcohol, medication, or emotional stress?: none reported Does patient have children?: Yes How many children?: 3 How is patient's relationship with their children?: Patient is not close with 2 adult children, talks to daughter, who lives nearby, occasionally.                         Allergies:    Allergies  Allergen Reactions   Nsaids Other (See Comments)    Stomach pain when taken on empty stomach   Robaxin [Methocarbamol] Nausea Only   Vicodin [Hydrocodone-Acetaminophen] Nausea And Vomiting and Rash   Lab Results:  Results for orders placed or performed during the hospital encounter of 01/16/23 (from the past 48 hour(s))  Hemoglobin A1c     Status: None   Collection Time: 01/18/23  6:24 AM  Result Value Ref Range   Hgb A1c MFr Bld 5.5 4.8 - 5.6 %    Comment: (NOTE) Pre diabetes:          5.7%-6.4%  Diabetes:              >6.4%  Glycemic control for   <7.0% adults with diabetes    Mean Plasma Glucose 111.15 mg/dL    Comment: Performed at [facility], [address] [place], [place] [address]  Lipid panel     Status: Abnormal   Collection  Time: 01/18/23  6:24 AM  Result Value Ref Range   Cholesterol 197 0 - 200 mg/dL   Triglycerides 80 <742 mg/dL   HDL 70 >59 mg/dL   Total CHOL/HDL Ratio 2.8 RATIO   VLDL 16 0 - 40 mg/dL   LDL Cholesterol 563 (H) 0 - 99 mg/dL    Comment:        Total Cholesterol/HDL:CHD Risk Coronary Heart Disease Risk Table                     Men   Women  1/2 Average Risk   3.4   3.3  Average Risk       5.0   4.4  2 X Average Risk   9.6   7.1  3 X Average Risk  23.4   11.0        Use the calculated Patient Ratio above and the CHD Risk Table to determine the patient's CHD Risk.        ATP III CLASSIFICATION (LDL):  <100     mg/dL   Optimal  875-643  mg/dL   Near or Above                    Optimal  130-159  mg/dL   Borderline  329-518  mg/dL   High  >841     mg/dL   Very High Performed at [facility], [address], [place], [place] [address]     Blood Alcohol level:  Lab Results  Component Value Date   ETH <10 10/18/2022   ETH <10 09/10/2022    Metabolic Disorder Labs:  Lab Results  Component Value Date   HGBA1C 5.5 01/18/2023   MPG 111.15 01/18/2023   No results found for: "PROLACTIN" Lab  Results  Component Value Date   CHOL 197 01/18/2023   TRIG 80 01/18/2023   HDL 70 01/18/2023   CHOLHDL 2.8 01/18/2023   VLDL 16 01/18/2023   LDLCALC 111 (H) 01/18/2023    Current Medications: Current Facility-Administered Medications  Medication Dose Route Frequency Provider Last Rate Last Admin   alum & mag hydroxide-simeth (MAALOX/MYLANTA) 200-200-20 MG/5ML suspension 30 mL  30 mL Oral Q4H PRN [name], NP       buprenorphine-naloxone (SUBOXONE) 8-2 mg per SL tablet 1 tablet  1 tablet Sublingual Daily [name], NP   1 tablet at 01/19/23 0747   cloNIDine (CATAPRES) tablet 0.1 mg  0.1 mg Oral TID PRN [name], MD       diphenhydrAMINE (BENADRYL) capsule 50 mg  50 mg Oral TID PRN [name], NP       Or   diphenhydrAMINE (BENADRYL) injection 50 mg  50 mg Intramuscular TID PRN [name], NP       doxycycline (VIBRA-TABS) tablet 100 mg  100 mg Oral Q12H [name], NP   100 mg at 01/19/23 0747   DULoxetine (CYMBALTA) DR capsule 30 mg  30 mg Oral Daily [name], MD   30 mg at 01/19/23 0747   haloperidol (HALDOL) tablet 5 mg  5 mg Oral TID PRN [name], NP       Or   haloperidol lactate (HALDOL) injection 5 mg  5 mg Intramuscular TID PRN [name], NP       hydrOXYzine (ATARAX) tablet 25 mg  25 mg Oral TID PRN [name], NP   25 mg at 01/18/23 1106   LORazepam (ATIVAN) tablet 2 mg  2 mg  Oral TID PRN [name], NP       Or   LORazepam (ATIVAN) injection 2 mg  2 mg Intramuscular TID PRN [name], NP       magnesium hydroxide (MILK OF MAGNESIA) suspension 30 mL  30 mL Oral Daily PRN [name], NP       nicotine (NICODERM CQ - dosed in mg/24 hours) patch 21 mg  21 mg Transdermal Daily [name], NP   21 mg at 01/19/23 0746   nicotine polacrilex (NICORETTE) gum 2 mg  2 mg Oral PRN [name], MD       pantoprazole (PROTONIX) EC tablet 40 mg  40 mg Oral Daily [name], NP   40 mg at  01/19/23 0747   risperiDONE (RISPERDAL) tablet 1 mg  1 mg Oral QHS [name], NP   1 mg at 01/17/23 2119   traZODone (DESYREL) tablet 50 mg  50 mg Oral QHS PRN [name], NP   50 mg at 01/17/23 2119   PTA Medications: Medications Prior to Admission  Medication Sig Dispense Refill Last Dose   Buprenorphine HCl-Naloxone HCl 8-2 MG FILM Place 1-2.5 Film under the tongue daily.      cephALEXin (KEFLEX) 500 MG capsule Take 500 mg by mouth 4 (four) times daily.      cloNIDine (CATAPRES) 0.1 MG tablet Take 0.1 mg by mouth 3 (three) times daily as needed (anxiety).      fluticasone (FLONASE) 50 MCG/ACT nasal spray Place 1 spray into both nostrils daily. (Patient not taking: Reported on 01/15/2023) 16 g 2    ibuprofen (ADVIL) 200 MG tablet Take 400-800 mg by mouth 2 (two) times daily as needed for moderate pain.      omeprazole (PRILOSEC) 20 MG capsule Take 1 capsule (20 mg total) by mouth daily. (Patient not taking: Reported on 01/15/2023) 30 capsule 0    ondansetron (ZOFRAN-ODT) 8 MG disintegrating tablet Take 8 mg by mouth 2 (two) times daily as needed for nausea or vomiting.      risperiDONE (RISPERDAL) 1 MG tablet Take 1 mg by mouth at bedtime as needed (mood).       Musculoskeletal: Strength & Muscle Tone: within normal limits Gait & Station: normal Patient leans: N/A            Psychiatric Specialty Exam:  Presentation  General Appearance:  Casual; Disheveled  Eye Contact: Fair  Speech: Clear and Coherent  Speech Volume: Decreased  Handedness: Right   Mood and Affect  Mood: Depressed; Labile; Irritable  Affect: Labile   Thought Process  Thought Processes: Irrevelant  Duration of Psychotic Symptoms:N/A Past Diagnosis of Schizophrenia or Psychoactive disorder: No  Descriptions of Associations:Tangential  Orientation:Full (Time, Place and Person)  Thought Content:Perseveration; Rumination  Hallucinations:Hallucinations:  Visual Description of Visual Hallucinations: Patient describes vague hallucinations, she thought she saw mouse where her shoes were.  She also thought one of her shoes look like an open mouth and got upset when other people do not see that illusion  Ideas of Reference:Delusions  Suicidal Thoughts:Suicidal Thoughts: No  Homicidal Thoughts:Homicidal Thoughts: No   Sensorium  Memory: Immediate Fair; Recent Fair; Remote Fair  Judgment: Fair  Insight: Fair   Art therapist  Concentration: Fair  Attention Span: Fair  Recall: [employer] of Knowledge: Fair  Language: Fair   Psychomotor Activity  Psychomotor Activity: Psychomotor Activity: Normal   Assets  Assets: Desire for Improvement; Communication Skills   Sleep  Sleep: Sleep: Fair Number of Hours  of Sleep: 8.5    Physical Exam: Physical Exam Constitutional:      Appearance: Normal appearance. She is normal weight.  HENT:     Head: Normocephalic.  Neurological:     General: No focal deficit present.     Mental Status: She is alert and oriented to person, place, and time. Mental status is at baseline.    Review of Systems  Psychiatric/Behavioral:  Positive for depression, substance abuse and suicidal ideas. The patient is nervous/anxious.   All other systems reviewed and are negative.  Blood pressure 98/72, pulse 83, temperature (!) 97.5 F (36.4 C), temperature source Oral, resp. rate 14, height 5\' 2"  (1.575 m), weight 44.9 kg, last menstrual period 11/17/2011, SpO2 98%. Body mass index is 18.11 kg/m.  Treatment Plan Summary: Daily contact with patient to assess and evaluate symptoms and progress in treatment, Medication management, and Plan the patient is admitted to the safe and secure environment for further observation of her depression and suicidal ideations.  She is contracting for safety.  She wants to be maintained on the Suboxone but is willing to consider an antidepressant although  SSRIs are ruled out because of possible paradoxical reaction or increased suicidal ideations.  Observation Level/Precautions:  15 minute checks  Laboratory:  CBC HbAIC HCG UDS  Psychotherapy: Group therapy.  An inpatient  Medications: The patient is being discharged on the following medications. Suboxone 8-2 1 tablet twice daily Clonidine 0.1 mg p.o. 3 times daily only as a as needed. Doxycycline Vibra-Tabs 100 mg every 12 hours NicoDerm CQ 21 mg per 24 hours. Protonix 40 mg a day Discontinue Risperdal 1 mg at bedtime Begin Abilify 2 mg a day Continue Cymbalta 30 mg p.o. daily.  This may also help her with her pain.   Ibuprofen for pain Clean and dressed the wound.  Consultations: None  Discharge Concerns: Possible relapse.  Estimated LOS: 5 to 7 days  Other:     Physician Treatment Plan for Primary Diagnosis: MDD (major depressive disorder) Long Term Goal(s): Improvement in symptoms so as ready for discharge  Short Term Goals: Ability to identify changes in lifestyle to reduce recurrence of condition will improve, Ability to disclose and discuss suicidal ideas, Ability to demonstrate self-control will improve, Ability to identify and develop effective coping behaviors will improve, and Compliance with prescribed medications will improve  Physician Treatment Plan for Secondary Diagnosis: Principal Problem:   MDD (major depressive disorder)  Long Term Goal(s): Improvement in symptoms so as ready for discharge  Short Term Goals: Ability to identify changes in lifestyle to reduce recurrence of condition will improve, Ability to demonstrate self-control will improve, Ability to identify and develop effective coping behaviors will improve, Compliance with prescribed medications will improve, and Ability to identify triggers associated with substance abuse/mental health issues will improve  I certify that inpatient services furnished can reasonably be expected to improve the patient's  condition.    [name], MD 10/15/202410:22 AM Patient ID: [name], female   DOB: Nov 24, 1970, 52 y.o.   MRN: [id number] Patient ID: Lucee Brissett, female   DOB: 1970/04/10, 52 y.o.   MRN: [id number]

## 2023-01-19 NOTE — Plan of Care (Signed)
  Problem: Safety: Goal: Periods of time without injury will increase Outcome: Progressing   

## 2023-01-19 NOTE — [facility] Group Notes (Signed)
Patient did not attend the [facility] group.

## 2023-01-19 NOTE — Group Note (Signed)
LCSW Group Therapy Note   Group Date: 01/19/2023 Start Time: 1100 End Time: 1200  LCSW Group Therapy Note               Type of Therapy and Topic:  Group Therapy: Establishing Boundaries  Participation Level:  None  In this group, patients learned how to define boundaries, discussed the different types or boundaries with examples.  They identified times that boundaries had been violated and how they reacted.  They analyzed how their reaction was possibly beneficial and how it was possibly unhelpful.  The group discussed how to set boundaries, respect others boundaries and communicate their boundaries. The group utilized a role play scenarios (working with a partner) and discussed how each person in the scenario could have reacted differently and what boundaries they need to implement to improve their life. Patients also discussed consequences to overstepping boundaries and lack of boundaries. Patients discussed how to establish boundaries with clear consequences. Patients will explore discussion questions that address media influence and why it is hard to set boundaries.   Therapeutic Goals: Patients will define boundaries and explore (physical, personal space and language boundaries). Patients will remember their last incident where their boundaries were violated and how they behaved. Patients will practice empathy and understanding of other's boundaries and learn from others in group. Patients will explore how they may have crossed another person's boundaries in the past.  Patients will learn healthy ways to set and communicate boundaries. Patients will actively engage in group activity utilizing role play and critical thinking skills.  Summary of Patient Progress:  Pt was in group but did not participate     Therapeutic Modalities:   Cognitive Behavioral Therapy  Izell Hawthorne, LCSW  01/19/2023 1:32 PM

## 2023-01-20 DIAGNOSIS — F331 Major depressive disorder, recurrent, moderate: Secondary | ICD-10-CM | POA: Diagnosis not present

## 2023-01-20 MED ORDER — ARIPIPRAZOLE 5 MG PO TABS
5.0000 mg | ORAL_TABLET | Freq: Every day | ORAL | Status: DC
  Administered 2023-01-21 – 2023-01-22 (×2): 5 mg via ORAL
  Filled 2023-01-20 (×3): qty 1

## 2023-01-20 MED ORDER — FLUTICASONE PROPIONATE 50 MCG/ACT NA SUSP
1.0000 | Freq: Every day | NASAL | Status: DC
  Administered 2023-01-20 – 2023-01-24 (×4): 1 via NASAL
  Filled 2023-01-20 (×2): qty 16

## 2023-01-20 NOTE — Progress Notes (Signed)
   01/20/23 0900  Psych Admission Type (Psych Patients Only)  Admission Status Voluntary  Psychosocial Assessment  Patient Complaints None  Eye Contact Fair  Facial Expression Animated  Affect Appropriate to circumstance  Speech Logical/coherent  Interaction Assertive  Motor Activity Other (Comment) (wnl)  Appearance/Hygiene Unremarkable  Behavior Characteristics Cooperative  Mood Pleasant  Thought Process  Coherency WDL  Content WDL  Delusions None reported or observed  Perception WDL  Hallucination None reported or observed  Judgment Impaired  Confusion WDL  Danger to Self  Current suicidal ideation? Denies  Agreement Not to Harm Self Yes  Description of Agreement verbal  Danger to Others  Danger to Others None reported or observed

## 2023-01-20 NOTE — Progress Notes (Signed)
Psychiatric progress note  Patient Identification: Kristina Hardy MRN:  [id number] Date of Evaluation:  01/20/2023 Chief Complaint:  MDD (major depressive disorder) [F32.9] Principal Diagnosis: MDD (major depressive disorder) Diagnosis:  Principal Problem:   MDD (major depressive disorder)  Reason for admission   The patient was admitted to [facility] adult inpatient unit on a voluntary status.  She was referred from the ED where she had a psych consult.  She was admitted for depression, passive suicidal ideations and possible psychosis.  This was in the context of substance use disorder and recent stress related to relationship.  Chart review from last 24 hours   Staff reports the patient has been feeling better since yesterday but has been sleeping poorly because of the heat in the room.  Staff reports that she is compliant with medications.  She is focused on the left forearm laceration that appears to be infected and is being treated with an antibiotic, doxycycline but does not appear to be improving. Yesterday, the psychiatry team made the following recommendations:  Continue home medications as prescribed. Continue Suboxone 8-2 mg daily Clonidine 0.1 mg 3 times daily Abilify 2 mg a day Wound care consultation Cymbalta 30 mg a day  Information obtained during interview   The patient was seen and evaluated today and the chart was reviewed.  The patient was noted to be alert oriented and cooperative but very labile and inconsistent in self-report of symptoms.  She claims that she is having a postnasal drip.  She is somatically preoccupied..  She also reports that she slept poorly because of the heat in the room.  Her mood is labile.  Initially she reported that her symptoms are much improved and that she is much more alert and doing much better overall. Discussed possible disposition at which point the patient became mildly animated reporting that her boyfriend is trying to get to fix an  apartment for both of them.  In the meantime she reports that at [facility] and the shelter people are against her.  She also believes that people intentionally were waiting for her to come to the [facility] and the" street people" were victimizing her.  She claims that they threatened her and they made up stories about her.  Then she went on further to say that she is not hallucinating because" other people see the same Muppets that I am seeing" if they see what I see then I am not hallucinating.  She also claims that she saw these Muppets ( as in the cartoons)  and took pictures of them on the phone.  She has a clear sensorium and is unclear whether she is actually hallucinating or possibly malingering.  She keeps insisting that no one believes her.  She also claims that she should not be ready to go home until next week.  She denies SI/HI.  Associated Signs/Symptoms: Depression Symptoms:  depressed mood, psychomotor agitation, difficulty concentrating, suicidal thoughts without plan, loss of energy/fatigue, disturbed sleep, (Hypo) Manic Symptoms:  Distractibility, Impulsivity, Irritable Mood, Anxiety Symptoms:  Excessive Worry, Psychotic Symptoms:  Paranoia, PTSD Symptoms: Negative Total Time spent with patient: 30 minutes  Past Psychiatric History: Patient gives a positive history of depression and history of multiple contacts with the ED for any of the substance use disorder, back pain or suicidal ideations but claims that she was never hospitalized.  Is the patient at risk to self? Yes.    Has the patient been a risk to self in the past 6 months?  Yes.    Has the patient been a risk to self within the distant past? Yes.    Is the patient a risk to others? No.  Has the patient been a risk to others in the past 6 months? No.  Has the patient been a risk to others within the distant past? No.   [place] Scale:  Flowsheet Row [facility]) [facility] from 01/14/2023 in [facility] Department at [facility] ED from 01/07/2023 in [facility] Emergency Department at [facility]  C-SSRS RISK CATEGORY No Risk No Risk No Risk        Prior Inpatient Therapy: No. If yes, describe patient had multiple ED contacts but no hospitalization. Prior Outpatient Therapy: Yes.   If yes, describe patient has been noncompliant.  She does go to the Suboxone clinic for the last 2 years.  Alcohol Screening:   Substance Abuse History in the last 12 months:  Yes.   Consequences of Substance Abuse: Negative Previous Psychotropic Medications: Yes  Psychological Evaluations: No  Past Medical History:  Past Medical History:  Diagnosis Date   Anxiety    Chronic back pain    Depression    Endometriosis    Hepatitis C    Hyperthyroidism     Past Surgical History:  Procedure Laterality Date   RHINOPLASTY     TONSILLECTOMY     Family History: No family history on file. Family Psychiatric  History: No pertinent psychiatric history. Tobacco Screening:  Social History   Tobacco Use  Smoking Status Every Day   Current packs/day: 1.00   Types: Cigarettes  Smokeless Tobacco Not on file    BH Tobacco Counseling     Are you interested in Tobacco Cessation Medications?  No value filed. Counseled patient on smoking cessation:  No value filed. Reason Tobacco Screening Not Completed: No value filed.       Social History:  Social History   Substance and Sexual Activity  Alcohol Use No     Social History   Substance and Sexual Activity  Drug Use No   Comment: hx of opiod abuse    Additional Social History: Marital status: Single Are you sexually active?: No What is your sexual orientation?: henterosexual Has your sexual activity been affected by drugs, alcohol, medication, or emotional stress?: none reported Does patient have children?: Yes How many children?: 3 How is patient's relationship with their  children?: Patient is not close with 2 adult children, talks to daughter, who lives nearby, occasionally.                         Allergies:   Allergies  Allergen Reactions   Nsaids Other (See Comments)    Stomach pain when taken on empty stomach   Robaxin [Methocarbamol] Nausea Only   Vicodin [Hydrocodone-Acetaminophen] Nausea And Vomiting and Rash   Lab Results:  No results found for this or any previous visit (from the past 48 hour(s)).   Blood Alcohol level:  Lab Results  Component Value Date   ETH <10 10/18/2022   ETH <10 09/10/2022    Metabolic Disorder Labs:  Lab Results  Component Value Date   HGBA1C 5.5 01/18/2023   MPG 111.15 01/18/2023   No results found for: "PROLACTIN" Lab Results  Component Value Date   CHOL 197 01/18/2023   TRIG 80 01/18/2023   HDL 70 01/18/2023   CHOLHDL 2.8 01/18/2023  VLDL 16 01/18/2023   LDLCALC 111 (H) 01/18/2023    Current Medications: Current Facility-Administered Medications  Medication Dose Route Frequency Provider Last Rate Last Admin   alum & mag hydroxide-simeth (MAALOX/MYLANTA) 200-200-20 MG/5ML suspension 30 mL  30 mL Oral Q4H PRN [name], NP       ARIPiprazole (ABILIFY) tablet 2 mg  2 mg Oral Daily [name], MD   2 mg at 01/20/23 5638   buprenorphine-naloxone (SUBOXONE) 8-2 mg per SL tablet 1 tablet  1 tablet Sublingual Daily [name], NP   1 tablet at 01/20/23 7564   cloNIDine (CATAPRES) tablet 0.1 mg  0.1 mg Oral TID PRN [name], MD       diphenhydrAMINE (BENADRYL) capsule 50 mg  50 mg Oral TID PRN [name], NP       Or   diphenhydrAMINE (BENADRYL) injection 50 mg  50 mg Intramuscular TID PRN [name], NP       doxycycline (VIBRA-TABS) tablet 100 mg  100 mg Oral Q12H [name], NP   100 mg at 01/20/23 3329   DULoxetine (CYMBALTA) DR capsule 30 mg  30 mg Oral Daily [name], MD   30 mg at 01/20/23 5188   haloperidol (HALDOL) tablet 5 mg  5 mg  Oral TID PRN [name], NP       Or   haloperidol lactate (HALDOL) injection 5 mg  5 mg Intramuscular TID PRN [name], NP       hydrOXYzine (ATARAX) tablet 25 mg  25 mg Oral TID PRN [name], NP   25 mg at 01/18/23 1106   ibuprofen (ADVIL) tablet 400 mg  400 mg Oral Q4H PRN [name], MD   400 mg at 01/19/23 1155   LORazepam (ATIVAN) tablet 2 mg  2 mg Oral TID PRN [name], NP       Or   LORazepam (ATIVAN) injection 2 mg  2 mg Intramuscular TID PRN [name], NP       magnesium hydroxide (MILK OF MAGNESIA) suspension 30 mL  30 mL Oral Daily PRN [name], NP       nicotine (NICODERM CQ - dosed in mg/24 hours) patch 21 mg  21 mg Transdermal Daily [name], NP   21 mg at 01/20/23 4166   nicotine polacrilex (NICORETTE) gum 2 mg  2 mg Oral PRN [name], MD   2 mg at 01/19/23 1155   ondansetron (ZOFRAN-ODT) disintegrating tablet 4 mg  4 mg Oral Q8H PRN [name], MD   4 mg at 01/19/23 1609   pantoprazole (PROTONIX) EC tablet 40 mg  40 mg Oral Daily [name], NP   40 mg at 01/20/23 0826   traZODone (DESYREL) tablet 50 mg  50 mg Oral QHS PRN [name], NP   50 mg at 01/17/23 2119   PTA Medications: Medications Prior to Admission  Medication Sig Dispense Refill Last Dose   Buprenorphine HCl-Naloxone HCl 8-2 MG FILM Place 1-2.5 Film under the tongue daily.      cephALEXin (KEFLEX) 500 MG capsule Take 500 mg by mouth 4 (four) times daily.      cloNIDine (CATAPRES) 0.1 MG tablet Take 0.1 mg by mouth 3 (three) times daily as needed (anxiety).      fluticasone (FLONASE) 50 MCG/ACT nasal spray Place 1 spray into both nostrils daily. (Patient not taking: Reported on 01/15/2023) 16 g 2    ibuprofen (ADVIL) 200 MG tablet Take 400-800 mg by mouth 2 (two) times  daily as needed for moderate pain.      omeprazole (PRILOSEC) 20 MG capsule Take 1 capsule (20 mg total) by mouth daily. (Patient not taking: Reported on  01/15/2023) 30 capsule 0    ondansetron (ZOFRAN-ODT) 8 MG disintegrating tablet Take 8 mg by mouth 2 (two) times daily as needed for nausea or vomiting.      risperiDONE (RISPERDAL) 1 MG tablet Take 1 mg by mouth at bedtime as needed (mood).       Musculoskeletal: Strength & Muscle Tone: within normal limits Gait & Station: normal Patient leans: N/A            Psychiatric Specialty Exam:  Presentation  General Appearance:  Bizarre; Casual  Eye Contact: Fair  Speech: Pressured  Speech Volume: Increased  Handedness: Right   Mood and Affect  Mood: Anxious; Labile  Affect: Full Range; Tearful   Thought Process  Thought Processes: Disorganized; Irrevelant  Duration of Psychotic Symptoms:N/A Past Diagnosis of Schizophrenia or Psychoactive disorder: No  Descriptions of Associations:Circumstantial  Orientation:Full (Time, Place and Person)  Thought Content:Illogical; Rumination; Perseveration  Hallucinations:Hallucinations: Visual Description of Auditory Hallucinations: Vague hallucinations Description of Visual Hallucinations: Vague hallucinations  Ideas of Reference:Delusions; Paranoia; Percusatory  Suicidal Thoughts:Suicidal Thoughts: No  Homicidal Thoughts:Homicidal Thoughts: No   Sensorium  Memory: Immediate Fair; Recent Fair; Remote Fair  Judgment: Fair  Insight: Fair   Chartered certified accountant: Fair  Attention Span: Fair  Recall: [employer] of Knowledge: Fair  Language: Fair   Psychomotor Activity  Psychomotor Activity: Psychomotor Activity: Normal   Assets  Assets: Desire for Improvement   Sleep  Sleep: Sleep: Fair Number of Hours of Sleep: 5    Physical Exam: Physical Exam Constitutional:      Appearance: Normal appearance. She is normal weight.  HENT:     Head: Normocephalic.  Skin:    Findings: Lesion present.     Comments: Has an infected wound on the left forearm  Neurological:      General: No focal deficit present.     Mental Status: She is alert and oriented to person, place, and time. Mental status is at baseline.    Review of Systems  Psychiatric/Behavioral:  Positive for depression, substance abuse and suicidal ideas. The patient is nervous/anxious.   All other systems reviewed and are negative.  Blood pressure 95/80, pulse 98, temperature 98.7 F (37.1 C), temperature source Oral, resp. rate 16, height 5\' 2"  (1.575 m), weight 44.9 kg, last menstrual period 11/17/2011, SpO2 98%. Body mass index is 18.11 kg/m.  Treatment Plan Summary: Daily contact with patient to assess and evaluate symptoms and progress in treatment, Medication management, and Plan the patient is admitted to the safe and secure environment for further observation of her depression and suicidal ideations.  She is contracting for safety.  She wants to be maintained on the Suboxone but is willing to consider an antidepressant although SSRIs are ruled out because of possible paradoxical reaction or increased suicidal ideations.  Observation Level/Precautions:  15 minute checks  Laboratory:  CBC HbAIC HCG UDS  Psychotherapy: Group therapy.  An inpatient  Medications: The patient is being discharged on the following medications. Suboxone 8-2 1 tablet twice daily Clonidine 0.1 mg p.o. 3 times daily only as a as needed. Doxycycline Vibra-Tabs 100 mg every 12 hours NicoDerm CQ 21 mg per 24 hours. Protonix 40 mg a day Discontinue Risperdal 1 mg at bedtime Increase Abilify to 5 mg taking Continue Cymbalta 30 mg p.o. daily.  This may also help her with her pain.   Ibuprofen for pain Wound care consult was completed today.  I appreciate the consult from [facility].  Consultations: Wound care consult completed.  Discharge Concerns: Possible relapse.  Estimated LOS: 5 to 7 days  Other:     Physician Treatment Plan for Primary Diagnosis: MDD (major depressive disorder) Long Term Goal(s):  Improvement in symptoms so as ready for discharge  Short Term Goals: Ability to identify changes in lifestyle to reduce recurrence of condition will improve, Ability to disclose and discuss suicidal ideas, Ability to demonstrate self-control will improve, Ability to identify and develop effective coping behaviors will improve, and Compliance with prescribed medications will improve  Physician Treatment Plan for Secondary Diagnosis: Principal Problem:   MDD (major depressive disorder)  Long Term Goal(s): Improvement in symptoms so as ready for discharge  Short Term Goals: Ability to identify changes in lifestyle to reduce recurrence of condition will improve, Ability to demonstrate self-control will improve, Ability to identify and develop effective coping behaviors will improve, Compliance with prescribed medications will improve, and Ability to identify triggers associated with substance abuse/mental health issues will improve  I certify that inpatient services furnished can reasonably be expected to improve the patient's condition.    [name], MD 10/16/202412:34 PM Patient ID: Kristina Hardy, female   DOB: [birth year], 52 y.o.   MRN: [id number] Patient ID: Kristina Hardy, female   DOB: September 12, 1970, 52 y.o.   MRN: [id number] Patient ID: Kristina Hardy, female   DOB: 16-Nov-1970, 52 y.o.   MRN: [id number]

## 2023-01-20 NOTE — Consult Note (Signed)
WOC Nurse Consult Note: Reason for Consult: old laceration Not clear when this was sustained or when and if sutures were used. Not able to find a procedural note to address this. Wound type: trauma  Pressure Injury POA: NA Measurement: see nursing flow sheets Wound bed: pending images  Drainage (amount, consistency, odor) 10/15 noted "wound to left wrist oozing , swollen and left arm swollen" 10/16 "laceration on right forearm. requiring dressing changes" I will message staff to clarify   Addendum: 1121 am wound is noted to be on the left wrist Dressing procedure/placement/frequency: Cleanse wound with saline, pat dry  Apply strip of silver hydrofiber [employer] [place] # [phone]), ok to cut piece and save remainder for next dressing change. Top with foam dressing Hart [place] # [id number] [id number]). Change every other day. If patient allowed to shower, ok to removed dressing and wash wound with soap and water, afterwards to be re-dressed by nursing staff   Re consult if needed, will not follow at this time. Thanks  Seila Liston [employer], RN,CWOCN, CNS, CWON-AP [phone])

## 2023-01-20 NOTE — [facility] Group Notes (Signed)
Spiritual care group facilitated by Chaplain Dyanne Carrel, [facility]  Group focused on topic of strength. Group members reflected on what thoughts and feelings emerge when they hear this topic. They then engaged in facilitated dialog around how strength is present in their lives. This dialog focused on representing what strength had been to them in their lives (images and patterns given) and what they saw as helpful in their life now (what they needed / wanted).  Activity drew on narrative framework.  Patient Progress: Kristina Hardy attended group and actively engaged and participated in group conversation and activities.

## 2023-01-20 NOTE — Progress Notes (Signed)
   01/20/23 2303  Psych Admission Type (Psych Patients Only)  Admission Status Voluntary  Psychosocial Assessment  Patient Complaints Anxiety  Eye Contact Fair  Facial Expression Animated  Affect Appropriate to circumstance  Speech Logical/coherent  Interaction Assertive  Motor Activity Slow  Appearance/Hygiene Unremarkable  Behavior Characteristics Cooperative  Mood Pleasant  Thought Process  Coherency WDL  Content WDL  Delusions None reported or observed  Perception WDL  Hallucination None reported or observed  Judgment Impaired  Confusion WDL  Danger to Self  Current suicidal ideation? Denies  Agreement Not to Harm Self Yes  Description of Agreement verbal  Danger to Others  Danger to Others None reported or observed

## 2023-01-20 NOTE — Group Note (Signed)
Recreation Therapy Group Note   Group Topic:Problem Solving  Group Date: 01/20/2023 Start Time: 0935 End Time: 1015 Facilitators: Sherece Gambrill-McCall, LRT,CTRS Location: [address] Dayroom   Group Topic: Communication, Team Building, Problem Solving  Goal Area(s) Addresses:  Patient will effectively work with peer towards shared goal.  Patient will identify skills used to make activity successful.  Patient will share challenges and verbalize solution-driven approaches used. Patient will identify how skills used during activity can be used to reach post d/c goals.   Intervention: STEM Activity   Group Description: [employer]. Patients were provided the following materials: 4 drinking straws, 5 rubber bands, 5 paper clips, 2 index cards, 2 drinking cups, and 2 toilet paper rolls. Using the provided materials patients were asked to build a launching mechanism to launch a ping pong ball across the room, approximately 10 feet. Patients were divided into teams of 3-5. Instructions required all materials be incorporated into the device, functionality of items left to the peer group's discretion.  Education: Pharmacist, community, Scientist, physiological, Air cabin crew, Building control surveyor.   Education Outcome: Acknowledges education/In group clarification offered/Needs additional education.    Affect/Mood: Appropriate   Participation Level: None   Participation Quality: None   Behavior: On-looking   Speech/Thought Process: Relevant   Insight: None   Judgement: None   Modes of Intervention: STEM Activity   Patient Response to Interventions:  Disengaged   Education Outcome:  In group clarification offered    Clinical Observations/Individualized Feedback: Pt came in late to group asking for a snack. Pt was informed she could request that when group was over. Pt didn't participate. Pt looked on for awhile, expressing she was too old for the activity. Pt left and didn't return.       Plan: Continue to engage patient in RT group sessions 2-3x/week.   Ledora Delker-McCall, LRT,CTRS 01/20/2023 11:50 AM

## 2023-01-20 NOTE — [facility] Group Notes (Signed)
Adult Psychoeducational Group Note  Date:  01/20/2023 Time:  9:31 PM  Group Topic/Focus:  Wrap-Up Group:   The focus of this group is to help patients review their daily goal of treatment and discuss progress on daily workbooks.  Participation Level:  Did Not Attend   Kristina Hardy 01/20/2023, 9:31 PM

## 2023-01-20 NOTE — Progress Notes (Signed)
D- Patient alert and oriented, affect/mood sad and depressed, she denies SI, HI, AVH, and pain.she spent the majority of the evening in her room, reports feeling lethargic and related it to her infection. Pt c/o that the laceration to  her L wrist was worsen her depression. Pt stated that her wound was cleaned x2 during the day and declined to have it cleaned and bandaged. A- Scheduled medications administered to patient, per MD orders. Support and encouragement provided.  Routine safety checks conducted every 15 minutes.  Patient informed to notify staff with problems or concerns. R- No adverse drug reactions noted. Patient contracts for safety at this time. Patient compliant with medications and treatment plan. Patient receptive, calm, and cooperative. Patient interacts well with others on the unit.  Patient remains safe at this time.

## 2023-01-21 DIAGNOSIS — F331 Major depressive disorder, recurrent, moderate: Secondary | ICD-10-CM | POA: Diagnosis not present

## 2023-01-21 LAB — DIFFERENTIAL
Abs Immature Granulocytes: 0 10*3/uL (ref 0.00–0.07)
Basophils Absolute: 0 10*3/uL (ref 0.0–0.1)
Basophils Relative: 1 %
Eosinophils Absolute: 0.1 10*3/uL (ref 0.0–0.5)
Eosinophils Relative: 2 %
Immature Granulocytes: 0 %
Lymphocytes Relative: 37 %
Lymphs Abs: 2.6 10*3/uL (ref 0.7–4.0)
Monocytes Absolute: 0.6 10*3/uL (ref 0.1–1.0)
Monocytes Relative: 8 %
Neutro Abs: 3.8 10*3/uL (ref 1.7–7.7)
Neutrophils Relative %: 52 %

## 2023-01-21 LAB — CBC
HCT: 38.8 % (ref 36.0–46.0)
Hemoglobin: 12.8 g/dL (ref 12.0–15.0)
MCH: 30.8 pg (ref 26.0–34.0)
MCHC: 33 g/dL (ref 30.0–36.0)
MCV: 93.3 fL (ref 80.0–100.0)
Platelets: 327 10*3/uL (ref 150–400)
RBC: 4.16 MIL/uL (ref 3.87–5.11)
RDW: 12.2 % (ref 11.5–15.5)
WBC: 7.1 10*3/uL (ref 4.0–10.5)
nRBC: 0 % (ref 0.0–0.2)

## 2023-01-21 MED ORDER — SODIUM CHLORIDE 0.9 % IN NEBU
INHALATION_SOLUTION | RESPIRATORY_TRACT | Status: AC
  Administered 2023-01-21: 4 mL
  Filled 2023-01-21: qty 6

## 2023-01-21 NOTE — Plan of Care (Signed)

## 2023-01-21 NOTE — Progress Notes (Addendum)
D. Pt presents anxious, but friendly- smiles- is pleasant during interactions- has been visible in the milieu, observed interacting well with peers and attending group. Pt currently denies SI/HI and AVH and agrees to contact staff before acting on any harmful thoughts.  A. Labs and vitals monitored. Pt given and educated on medications. Pt supported emotionally and encouraged to express concerns and ask questions.   R. Pt remains safe with 15 minute checks. Will continue POC.    01/21/23 1200  Psych Admission Type (Psych Patients Only)  Admission Status Voluntary  Psychosocial Assessment  Patient Complaints Anxiety  Eye Contact Fair  Facial Expression Animated  Affect Appropriate to circumstance  Speech Logical/coherent  Interaction Assertive  Motor Activity Slow  Appearance/Hygiene Unremarkable  Behavior Characteristics Cooperative;Appropriate to situation  Mood Pleasant  Thought Process  Coherency WDL  Content WDL  Delusions None reported or observed  Perception WDL  Hallucination None reported or observed  Judgment Impaired  Confusion WDL  Danger to Self  Current suicidal ideation? Denies  Agreement Not to Harm Self Yes  Description of Agreement agreed to contact staff before acting on harmful thoughts  Danger to Others  Danger to Others None reported or observed

## 2023-01-21 NOTE — Progress Notes (Signed)
Psychiatric progress note  Patient Identification: Kristina Hardy MRN:  [id number] Date of Evaluation:  01/21/2023 Chief Complaint:  MDD (major depressive disorder) [F32.9] Principal Diagnosis: MDD (major depressive disorder) Diagnosis:  Principal Problem:   MDD (major depressive disorder)  Reason for admission   The patient was admitted to [facility] adult inpatient unit on a voluntary status.  She was referred from the ED where she had a psych consult.  She was admitted for depression, passive suicidal ideations and possible psychosis.  This was in the context of substance use disorder and recent stress related to relationship.  Chart review from last 24 hours   Staff reports the patient slept better after she was moved to a different room.  She reports improved symptoms.  Received hydroxyzine,ibuprofen as PRNs. Yesterday, the psychiatry team made the following recommendations:  Continue home medications as prescribed. Continue Suboxone 8-2 mg daily Clonidine 0.1 mg 3 times daily Increase Abilify 5 mg a day Wound care consultation Cymbalta 30 mg a day  Information obtained during interview   The patient was seen and evaluated today and the chart was reviewed.  Today she is a lot more brighter and pleasant.  She reports she slept 6 hours.  She denies any side effects other than a mild headache.  Her mood is a 5/10 her anxiety is a 5/10 and suicidal ideations of 0/10.  Today she also denied any auditory or visual hallucinations and has not seen any of these"Muppets" again.  She has called her boyfriend and reports that he is still working on the placement.  She is very fearful of going back on the street because she feels that everyone was against her and was threatening.  She is somewhat improved.  The plan would be for her to be discharged by Monday if not earlier.  Associated Signs/Symptoms: Depression Symptoms:  depressed mood, psychomotor agitation, difficulty concentrating, suicidal  thoughts without plan, loss of energy/fatigue, disturbed sleep, (Hypo) Manic Symptoms:  Distractibility, Impulsivity, Irritable Mood, Anxiety Symptoms:  Excessive Worry, Psychotic Symptoms:  Paranoia, PTSD Symptoms: Negative Total Time spent with patient: 30 minutes  Past Psychiatric History: Patient gives a positive history of depression and history of multiple contacts with the ED for any of the substance use disorder, back pain or suicidal ideations but claims that she was never hospitalized.  Is the patient at risk to self? Yes.    Has the patient been a risk to self in the past 6 months? Yes.    Has the patient been a risk to self within the distant past? Yes.    Is the patient a risk to others? No.  Has the patient been a risk to others in the past 6 months? No.  Has the patient been a risk to others within the distant past? No.   [place] Scale:  Flowsheet Row Ad[facility] (Current) [facility] from 01/14/2023 in [facility] Department at [facility] ED from 01/07/2023 in [facility] Emergency Department at [facility]  C-SSRS RISK CATEGORY No Risk No Risk No Risk        Prior Inpatient Therapy: No. If yes, describe patient had multiple ED contacts but no hospitalization. Prior Outpatient Therapy: Yes.   If yes, describe patient has been noncompliant.  She does go to the Suboxone clinic for the last 2 years.  Alcohol Screening:   Substance Abuse History in the last 12 months:  Yes.   Consequences of  Substance Abuse: Negative Previous Psychotropic Medications: Yes  Psychological Evaluations: No  Past Medical History:  Past Medical History:  Diagnosis Date   Anxiety    Chronic back pain    Depression    Endometriosis    Hepatitis C    Hyperthyroidism     Past Surgical History:  Procedure Laterality Date   RHINOPLASTY     TONSILLECTOMY     Family History: No family history on  file. Family Psychiatric  History: No pertinent psychiatric history. Tobacco Screening:  Social History   Tobacco Use  Smoking Status Every Day   Current packs/day: 1.00   Types: Cigarettes  Smokeless Tobacco Not on file    BH Tobacco Counseling     Are you interested in Tobacco Cessation Medications?  No value filed. Counseled patient on smoking cessation:  No value filed. Reason Tobacco Screening Not Completed: No value filed.       Social History:  Social History   Substance and Sexual Activity  Alcohol Use No     Social History   Substance and Sexual Activity  Drug Use No   Comment: hx of opiod abuse    Additional Social History: Marital status: Single Are you sexually active?: No What is your sexual orientation?: henterosexual Has your sexual activity been affected by drugs, alcohol, medication, or emotional stress?: none reported Does patient have children?: Yes How many children?: 3 How is patient's relationship with their children?: Patient is not close with 2 adult children, talks to daughter, who lives nearby, occasionally.                         Allergies:   Allergies  Allergen Reactions   Nsaids Other (See Comments)    Stomach pain when taken on empty stomach   Robaxin [Methocarbamol] Nausea Only   Vicodin [Hydrocodone-Acetaminophen] Nausea And Vomiting and Rash   Lab Results:  Results for orders placed or performed during the hospital encounter of 01/16/23 (from the past 48 hour(s))  CBC     Status: None   Collection Time: 01/21/23  6:28 AM  Result Value Ref Range   WBC 7.1 4.0 - 10.5 K/uL   RBC 4.16 3.87 - 5.11 MIL/uL   Hemoglobin 12.8 12.0 - 15.0 g/dL   HCT 13.0 86.5 - 78.4 %   MCV 93.3 80.0 - 100.0 fL   MCH 30.8 26.0 - 34.0 pg   MCHC 33.0 30.0 - 36.0 g/dL   RDW 69.6 29.5 - 28.4 %   Platelets 327 150 - 400 K/uL   nRBC 0.0 0.0 - 0.2 %    Comment: Performed at [facility], [address] [place], [place]  [address]  Differential     Status: None   Collection Time: 01/21/23  6:28 AM  Result Value Ref Range   Neutrophils Relative % 52 %   Neutro Abs 3.8 1.7 - 7.7 K/uL   Lymphocytes Relative 37 %   Lymphs Abs 2.6 0.7 - 4.0 K/uL   Monocytes Relative 8 %   Monocytes Absolute 0.6 0.1 - 1.0 K/uL   Eosinophils Relative 2 %   Eosinophils Absolute 0.1 0.0 - 0.5 K/uL   Basophils Relative 1 %   Basophils Absolute 0.0 0.0 - 0.1 K/uL   Immature Granulocytes 0 %   Abs Immature Granulocytes 0.00 0.00 - 0.07 K/uL    Comment: Performed at [facility], [address], [place], [place] [address]  Blood Alcohol level:  Lab Results  Component Value Date   ETH <10 10/18/2022   ETH <10 09/10/2022    Metabolic Disorder Labs:  Lab Results  Component Value Date   HGBA1C 5.5 01/18/2023   MPG 111.15 01/18/2023   No results found for: "PROLACTIN" Lab Results  Component Value Date   CHOL 197 01/18/2023   TRIG 80 01/18/2023   HDL 70 01/18/2023   CHOLHDL 2.8 01/18/2023   VLDL 16 01/18/2023   LDLCALC 111 (H) 01/18/2023    Current Medications: Current Facility-Administered Medications  Medication Dose Route Frequency Provider Last Rate Last Admin   alum & mag hydroxide-simeth (MAALOX/MYLANTA) 200-200-20 MG/5ML suspension 30 mL  30 mL Oral Q4H PRN [name], NP       ARIPiprazole (ABILIFY) tablet 5 mg  5 mg Oral Daily [name], MD   5 mg at 01/21/23 0758   buprenorphine-naloxone (SUBOXONE) 8-2 mg per SL tablet 1 tablet  1 tablet Sublingual Daily [name], NP   1 tablet at 01/21/23 0758   cloNIDine (CATAPRES) tablet 0.1 mg  0.1 mg Oral TID PRN [name], MD       diphenhydrAMINE (BENADRYL) capsule 50 mg  50 mg Oral TID PRN [name], NP       Or   diphenhydrAMINE (BENADRYL) injection 50 mg  50 mg Intramuscular TID PRN [name], NP       DULoxetine (CYMBALTA) DR capsule 30 mg  30 mg Oral Daily [name], MD   30 mg at 01/21/23 0758    fluticasone (FLONASE) 50 MCG/ACT nasal spray 1 spray  1 spray Each Nare Daily [name], MD   1 spray at 01/21/23 0758   haloperidol (HALDOL) tablet 5 mg  5 mg Oral TID PRN [name], NP       Or   haloperidol lactate (HALDOL) injection 5 mg  5 mg Intramuscular TID PRN [name], NP       hydrOXYzine (ATARAX) tablet 25 mg  25 mg Oral TID PRN [name], NP   25 mg at 01/20/23 1350   ibuprofen (ADVIL) tablet 400 mg  400 mg Oral Q4H PRN [name], MD   400 mg at 01/21/23 0801   LORazepam (ATIVAN) tablet 2 mg  2 mg Oral TID PRN [name], NP       Or   LORazepam (ATIVAN) injection 2 mg  2 mg Intramuscular TID PRN [name], NP       magnesium hydroxide (MILK OF MAGNESIA) suspension 30 mL  30 mL Oral Daily PRN [name], NP       nicotine (NICODERM CQ - dosed in mg/24 hours) patch 21 mg  21 mg Transdermal Daily [name], NP   21 mg at 01/21/23 0756   nicotine polacrilex (NICORETTE) gum 2 mg  2 mg Oral PRN [name], MD   2 mg at 01/19/23 1155   ondansetron (ZOFRAN-ODT) disintegrating tablet 4 mg  4 mg Oral Q8H PRN [name], MD   4 mg at 01/19/23 1609   pantoprazole (PROTONIX) EC tablet 40 mg  40 mg Oral Daily [name], NP   40 mg at 01/21/23 0758   traZODone (DESYREL) tablet 50 mg  50 mg Oral QHS PRN [name], NP   50 mg at 01/17/23 2119   PTA Medications: Medications Prior to Admission  Medication Sig Dispense Refill Last Dose   Buprenorphine HCl-Naloxone HCl 8-2 MG FILM Place 1-2.5 Film under the tongue daily.  cephALEXin (KEFLEX) 500 MG capsule Take 500 mg by mouth 4 (four) times daily.      cloNIDine (CATAPRES) 0.1 MG tablet Take 0.1 mg by mouth 3 (three) times daily as needed (anxiety).      fluticasone (FLONASE) 50 MCG/ACT nasal spray Place 1 spray into both nostrils daily. (Patient not taking: Reported on 01/15/2023) 16 g 2    ibuprofen (ADVIL) 200 MG tablet Take 400-800 mg by mouth 2 (two)  times daily as needed for moderate pain.      omeprazole (PRILOSEC) 20 MG capsule Take 1 capsule (20 mg total) by mouth daily. (Patient not taking: Reported on 01/15/2023) 30 capsule 0    ondansetron (ZOFRAN-ODT) 8 MG disintegrating tablet Take 8 mg by mouth 2 (two) times daily as needed for nausea or vomiting.      risperiDONE (RISPERDAL) 1 MG tablet Take 1 mg by mouth at bedtime as needed (mood).       Musculoskeletal: Strength & Muscle Tone: within normal limits Gait & Station: normal Patient leans: N/A            Psychiatric Specialty Exam:  Presentation  General Appearance:  Casual  Eye Contact: Fair  Speech: Clear and Coherent  Speech Volume: Normal  Handedness: Right   Mood and Affect  Mood: Anxious  Affect: Appropriate   Thought Process  Thought Processes: Coherent  Duration of Psychotic Symptoms:N/A Past Diagnosis of Schizophrenia or Psychoactive disorder: No  Descriptions of Associations:Intact  Orientation:Full (Time, Place and Person)  Thought Content:Perseveration  Hallucinations:Hallucinations: None Description of Auditory Hallucinations: Vague hallucinations Description of Visual Hallucinations: Patient was seeing Muppets by self-report but denies any today.  Ideas of Reference:None  Suicidal Thoughts:Suicidal Thoughts: No  Homicidal Thoughts:Homicidal Thoughts: No   Sensorium  Memory: Immediate Fair; Recent Fair; Remote Fair  Judgment: Fair  Insight: Fair   Chartered certified accountant: Fair  Attention Span: Fair  Recall: [employer] of Knowledge: Fair  Language: Fair   Psychomotor Activity  Psychomotor Activity: Psychomotor Activity: Normal   Assets  Assets: Manufacturing systems engineer; Desire for Improvement   Sleep  Sleep: Sleep: Good Number of Hours of Sleep: 6    Physical Exam: Physical Exam Constitutional:      Appearance: Normal appearance. She is normal weight.  HENT:      Head: Normocephalic.  Skin:    Findings: Lesion present.     Comments: Has an infected wound on the left forearm  Neurological:     General: No focal deficit present.     Mental Status: She is alert and oriented to person, place, and time. Mental status is at baseline.    Review of Systems  Psychiatric/Behavioral:  Positive for depression, substance abuse and suicidal ideas. The patient is nervous/anxious.   All other systems reviewed and are negative.  Blood pressure (!) 120/96, pulse 98, temperature 97.7 F (36.5 C), temperature source Oral, resp. rate 16, height 5\' 2"  (1.575 m), weight 44.9 kg, last menstrual period 11/17/2011, SpO2 100%. Body mass index is 18.11 kg/m.  Treatment Plan Summary: Daily contact with patient to assess and evaluate symptoms and progress in treatment, Medication management, and Plan the patient is admitted to the safe and secure environment for further observation of her depression and suicidal ideations.  She is contracting for safety.  She wants to be maintained on the Suboxone but is willing to consider an antidepressant although SSRIs are ruled out because of possible paradoxical reaction or increased suicidal ideations.  Observation Level/Precautions:  15 minute checks  Laboratory:  CBC HbAIC HCG UDS  Psychotherapy: Group therapy.  An inpatient  Medications: The patient is being discharged on the following medications. Suboxone 8-2 1 tablet twice daily Clonidine 0.1 mg p.o. 3 times daily only as a as needed. Doxycycline Vibra-Tabs 100 mg every 12 hours NicoDerm CQ 21 mg per 24 hours. Protonix 40 mg a day Discontinue Risperdal 1 mg at bedtime Continue Abilify to 5 mg taking Continue Cymbalta 30 mg p.o. daily.  This may also help her with her pain.   Ibuprofen for pain Wound care consult was completed today.  I appreciate the consult from [facility] Consult.  Consultations: Wound care consult completed.  Discharge Concerns: Possible relapse.  Estimated  LOS: Possible discharge by Monday  Other:     Physician Treatment Plan for Primary Diagnosis: MDD (major depressive disorder) Long Term Goal(s): Improvement in symptoms so as ready for discharge  Short Term Goals: Ability to identify changes in lifestyle to reduce recurrence of condition will improve, Ability to disclose and discuss suicidal ideas, Ability to demonstrate self-control will improve, Ability to identify and develop effective coping behaviors will improve, and Compliance with prescribed medications will improve  Physician Treatment Plan for Secondary Diagnosis: Principal Problem:   MDD (major depressive disorder)  Long Term Goal(s): Improvement in symptoms so as ready for discharge  Short Term Goals: Ability to identify changes in lifestyle to reduce recurrence of condition will improve, Ability to demonstrate self-control will improve, Ability to identify and develop effective coping behaviors will improve, Compliance with prescribed medications will improve, and Ability to identify triggers associated with substance abuse/mental health issues will improve  I certify that inpatient services furnished can reasonably be expected to improve the patient's condition.    [name], MD 10/17/202410:11 AM Patient ID: [name], female   DOB: 1971-03-11, 52 y.o.   MRN: [id number] Patient ID: Levi Crass, female   DOB: [birth year], 52 y.o.   MRN: [id number] Patient ID: Berkley Wrightsman, female   DOB: September 07, 1970, 52 y.o.   MRN: [id number] Patient ID: Sayra Frisby, female   DOB: [birth year], 52 y.o.   MRN: [id number]

## 2023-01-21 NOTE — Group Note (Unsigned)
[facility] LCSW Group Therapy Note   Group Date: 01/21/2023 Start Time: 1100 End Time: 1145  Type of Therapy/Topic:  Group Therapy:  Feelings about Diagnosis  Participation Level:  {[facility] PARTICIPATION VHQIO:[id number]}   Mood: ***   Description of Group:    This group will allow patients to explore their thoughts and feelings about diagnoses they have received. Patients will be guided to explore their level of understanding and acceptance of these diagnoses. Facilitator will encourage patients to process their thoughts and feelings about the reactions of others to their diagnosis, and will guide patients in identifying ways to discuss their diagnosis with significant others in their lives. This group will be process-oriented, with patients participating in exploration of their own experiences as well as giving and receiving support and challenge from other group members.   Therapeutic Goals: 1. Patient will demonstrate understanding of diagnosis as evidence by identifying two or more symptoms of the disorder:  2. Patient will be able to express two feelings regarding the diagnosis 3. Patient will demonstrate ability to communicate their needs through discussion and/or role plays  Summary of Patient Progress:    ***    Therapeutic Modalities:   Cognitive Behavioral Therapy Brief Therapy Feelings Identification    Kristina Hardy S Kristina Shankman, LCSW

## 2023-01-21 NOTE — Group Note (Signed)
[facility] LCSW Group Therapy Note   Group Date: 01/21/2023 Start Time: 1100 End Time: 1145  Type of Therapy/Topic:  Group Therapy:  Feelings about Diagnosis  Participation Level:  Active   Mood: Appropriate   Description of Group:    This group will allow patients to explore their thoughts and feelings about diagnoses they have received. Patients will be guided to explore their level of understanding and acceptance of these diagnoses. Facilitator will encourage patients to process their thoughts and feelings about the reactions of others to their diagnosis, and will guide patients in identifying ways to discuss their diagnosis with significant others in their lives. This group will be process-oriented, with patients participating in exploration of their own experiences as well as giving and receiving support and challenge from other group members.   Therapeutic Goals: 1. Patient will demonstrate understanding of diagnosis as evidence by identifying two or more symptoms of the disorder:  2. Patient will be able to express two feelings regarding the diagnosis 3. Patient will demonstrate ability to communicate their needs through discussion and/or role plays  Summary of Patient Progress:  Pt was pleasant and provided good insight      Therapeutic Modalities:   Cognitive Behavioral Therapy Brief Therapy Feelings Identification    Kasey Hansell S Cody Albus, LCSW

## 2023-01-21 NOTE — [facility] Group Notes (Signed)
Pt did not attend goals group. 

## 2023-01-21 NOTE — Plan of Care (Signed)
Problem: Health Behavior/Discharge Planning: Goal: Compliance with treatment plan for underlying cause of condition will improve Outcome: Progressing   Problem: Physical Regulation: Goal: Ability to maintain clinical measurements within normal limits will improve Outcome: Progressing   Problem: Safety: Goal: Periods of time without injury will increase Outcome: Progressing

## 2023-01-22 ENCOUNTER — Encounter (HOSPITAL_COMMUNITY): Payer: Self-pay

## 2023-01-22 DIAGNOSIS — F331 Major depressive disorder, recurrent, moderate: Secondary | ICD-10-CM | POA: Diagnosis not present

## 2023-01-22 MED ORDER — SODIUM CHLORIDE 0.9 % IN NEBU
INHALATION_SOLUTION | RESPIRATORY_TRACT | Status: AC
  Filled 2023-01-22: qty 3

## 2023-01-22 MED ORDER — DULOXETINE HCL 30 MG PO CPEP
30.0000 mg | ORAL_CAPSULE | Freq: Every day | ORAL | Status: DC
  Filled 2023-01-22 (×4): qty 1

## 2023-01-22 MED ORDER — ARIPIPRAZOLE 5 MG PO TABS
5.0000 mg | ORAL_TABLET | Freq: Every day | ORAL | Status: DC
  Filled 2023-01-22: qty 1

## 2023-01-22 NOTE — [facility] Group Notes (Signed)
Pt did not attend AA group  

## 2023-01-22 NOTE — BH IP Treatment Plan (Signed)
Interdisciplinary Treatment and Diagnostic Plan Update  01/22/2023 Time of Session: 11:35AM UPDATE Kristina Hardy MRN: [id number]  Principal Diagnosis: MDD (major depressive disorder)  Secondary Diagnoses: Principal Problem:   MDD (major depressive disorder)   Current Medications:  Current Facility-Administered Medications  Medication Dose Route Frequency Provider Last Rate Last Admin   alum & mag hydroxide-simeth (MAALOX/MYLANTA) 200-200-20 MG/5ML suspension 30 mL  30 mL Oral Q4H PRN [name], NP       [START ON 01/23/2023] ARIPiprazole (ABILIFY) tablet 5 mg  5 mg Oral QHS [name], [name], DO       buprenorphine-naloxone (SUBOXONE) 8-2 mg per SL tablet 1 tablet  1 tablet Sublingual Daily [name], NP   1 tablet at 01/22/23 0901   cloNIDine (CATAPRES) tablet 0.1 mg  0.1 mg Oral TID PRN [name], MD       diphenhydrAMINE (BENADRYL) capsule 50 mg  50 mg Oral TID PRN [name], NP       Or   diphenhydrAMINE (BENADRYL) injection 50 mg  50 mg Intramuscular TID PRN [name], NP       [START ON 01/23/2023] DULoxetine (CYMBALTA) DR capsule 30 mg  30 mg Oral QHS [name], [name], DO       fluticasone (FLONASE) 50 MCG/ACT nasal spray 1 spray  1 spray Each Nare Daily [name], MD   1 spray at 01/22/23 0901   haloperidol (HALDOL) tablet 5 mg  5 mg Oral TID PRN [name], NP       Or   haloperidol lactate (HALDOL) injection 5 mg  5 mg Intramuscular TID PRN [name], NP       hydrOXYzine (ATARAX) tablet 25 mg  25 mg Oral TID PRN [name], NP   25 mg at 01/20/23 1350   ibuprofen (ADVIL) tablet 400 mg  400 mg Oral Q4H PRN [name], MD   400 mg at 01/21/23 0801   LORazepam (ATIVAN) tablet 2 mg  2 mg Oral TID PRN [name], NP       Or   LORazepam (ATIVAN) injection 2 mg  2 mg Intramuscular TID PRN [name], NP       magnesium hydroxide (MILK OF MAGNESIA) suspension 30 mL  30 mL Oral Daily PRN [name], NP       nicotine (NICODERM CQ - dosed in mg/24 hours) patch 21 mg  21 mg Transdermal Daily [name], NP   21 mg at 01/22/23 1010   nicotine polacrilex (NICORETTE) gum 2 mg  2 mg Oral PRN [name], MD   2 mg at 01/22/23 1018   ondansetron (ZOFRAN-ODT) disintegrating tablet 4 mg  4 mg Oral Q8H PRN [name], MD   4 mg at 01/19/23 1609   pantoprazole (PROTONIX) EC tablet 40 mg  40 mg Oral Daily [name], NP   40 mg at 01/22/23 0901   traZODone (DESYREL) tablet 50 mg  50 mg Oral QHS PRN [name], NP   50 mg at 01/17/23 2119   PTA Medications: Medications Prior to Admission  Medication Sig Dispense Refill Last Dose   Buprenorphine HCl-Naloxone HCl 8-2 MG FILM Place 1-2.5 Film under the tongue daily.      cephALEXin (KEFLEX) 500 MG capsule Take 500 mg by mouth 4 (four) times daily.      cloNIDine (CATAPRES) 0.1 MG tablet Take 0.1 mg by mouth 3 (three) times daily as needed (anxiety).      fluticasone (FLONASE) 50 MCG/ACT nasal spray  Place 1 spray into both nostrils daily. (Patient not taking: Reported on 01/15/2023) 16 g 2    ibuprofen (ADVIL) 200 MG tablet Take 400-800 mg by mouth 2 (two) times daily as needed for moderate pain.      omeprazole (PRILOSEC) 20 MG capsule Take 1 capsule (20 mg total) by mouth daily. (Patient not taking: Reported on 01/15/2023) 30 capsule 0    ondansetron (ZOFRAN-ODT) 8 MG disintegrating tablet Take 8 mg by mouth 2 (two) times daily as needed for nausea or vomiting.      risperiDONE (RISPERDAL) 1 MG tablet Take 1 mg by mouth at bedtime as needed (mood).       Patient Stressors:    Patient Strengths:    Treatment Modalities: Medication Management, Group therapy, Case management,  1 to 1 session with clinician, Psychoeducation, Recreational therapy.   Physician Treatment Plan for Primary Diagnosis: MDD (major depressive disorder) Long Term Goal(s): Improvement in symptoms so as ready for discharge   Short Term  Goals: Ability to identify changes in lifestyle to reduce recurrence of condition will improve Ability to demonstrate self-control will improve Ability to identify and develop effective coping behaviors will improve Compliance with prescribed medications will improve Ability to identify triggers associated with substance abuse/mental health issues will improve Ability to disclose and discuss suicidal ideas  Medication Management: Evaluate patient's response, side effects, and tolerance of medication regimen.  Therapeutic Interventions: 1 to 1 sessions, Unit Group sessions and Medication administration.  Evaluation of Outcomes: Progressing  Physician Treatment Plan for Secondary Diagnosis: Principal Problem:   MDD (major depressive disorder)  Long Term Goal(s): Improvement in symptoms so as ready for discharge   Short Term Goals: Ability to identify changes in lifestyle to reduce recurrence of condition will improve Ability to demonstrate self-control will improve Ability to identify and develop effective coping behaviors will improve Compliance with prescribed medications will improve Ability to identify triggers associated with substance abuse/mental health issues will improve Ability to disclose and discuss suicidal ideas     Medication Management: Evaluate patient's response, side effects, and tolerance of medication regimen.  Therapeutic Interventions: 1 to 1 sessions, Unit Group sessions and Medication administration.  Evaluation of Outcomes: Progressing   RN Treatment Plan for Primary Diagnosis: MDD (major depressive disorder) Long Term Goal(s): Knowledge of disease and therapeutic regimen to maintain health will improve  Short Term Goals: Ability to remain free from injury will improve, Ability to verbalize frustration and anger appropriately will improve, Ability to participate in decision making will improve, Ability to verbalize feelings will improve, Ability to identify  and develop effective coping behaviors will improve, and Compliance with prescribed medications will improve  Medication Management: RN will administer medications as ordered by provider, will assess and evaluate patient's response and provide education to patient for prescribed medication. RN will report any adverse and/or side effects to prescribing provider.  Therapeutic Interventions: 1 on 1 counseling sessions, Psychoeducation, Medication administration, Evaluate responses to treatment, Monitor vital signs and CBGs as ordered, Perform/monitor CIWA, COWS, AIMS and Fall Risk screenings as ordered, Perform wound care treatments as ordered.  Evaluation of Outcomes: Progressing   LCSW Treatment Plan for Primary Diagnosis: MDD (major depressive disorder) Long Term Goal(s): Safe transition to appropriate next level of care at discharge, Engage patient in therapeutic group addressing interpersonal concerns.  Short Term Goals: Engage patient in aftercare planning with referrals and resources, Increase social support, Increase emotional regulation, Facilitate acceptance of mental health diagnosis and concerns, Identify triggers associated with  mental health/substance abuse issues, and Increase skills for wellness and recovery  Therapeutic Interventions: Assess for all discharge needs, 1 to 1 time with Social worker, Explore available resources and support systems, Assess for adequacy in community support network, Educate family and significant other(s) on suicide prevention, Complete Psychosocial Assessment, Interpersonal group therapy.  Evaluation of Outcomes: Progressing   Progress in Treatment: Attending groups: Yes. Participating in groups: Yes. Taking medication as prescribed: Yes. Toleration medication: Yes. Family/Significant other contact made: Yes, individual(s) contacted:  Norm Salt (bf [phone]) Patient understands diagnosis: Yes. Discussing patient identified problems/goals  with staff: Yes. Medical problems stabilized or resolved: Yes. Denies suicidal/homicidal ideation: Yes. Issues/concerns per patient self-inventory: No.   New problem(s) identified: No, Describe:  none reported   New Short Term/Long Term Goal(s):medication stabilization, elimination of SI thoughts, development of comprehensive mental wellness plan.      Patient Goals:  "Stay clean and not be so depressed."   Discharge Plan or Barriers: Patient recently admitted. CSW will continue to follow and assess for appropriate referrals and possible discharge planning.      Reason for Continuation of Hospitalization: Depression Medication stabilization Suicidal ideation   Estimated Length of Stay: 3-5 days   Last 3 [place] Suicide Severity Risk Score: Flowsheet Row Ad[facility] (Current) [facility] from [facility]14/2023 in [facility] ED from 01/07/2023 in [facility] Emergency Department at [facility]  C-SSRS RISK CATEGORY No Risk No Risk No Risk       Last PHQ 2/9 Scores:     No data to display          Scribe for Treatment Team: Kathi Der, Theresia Majors 01/22/2023 12:46 PM

## 2023-01-22 NOTE — Group Note (Unsigned)
Date:  01/22/2023 Time:  1:47 PM  Group Topic/Focus:  Goals Group:   The focus of this group is to help patients establish daily goals to achieve during treatment and discuss how the patient can incorporate goal setting into their daily lives to aide in recovery.     Participation Level:  {BHH PARTICIPATION IRJJO:84166}  Participation Quality:  {BHH PARTICIPATION QUALITY:22265}  Affect:  {BHH AFFECT:22266}  Cognitive:  {BHH COGNITIVE:22267}  Insight: {BHH Insight2:20797}  Engagement in Group:  {BHH ENGAGEMENT IN AYTKZ:60109}  Modes of Intervention:  {BHH MODES OF INTERVENTION:22269}  Additional Comments:  ***  Reymundo Poll 01/22/2023, 1:47 PM

## 2023-01-22 NOTE — Group Note (Signed)
Recreation Therapy Group Note   Group Topic:Problem Solving  Group Date: 01/22/2023 Start Time: 0935 End Time: 1007 Facilitators: Jenesa Foresta-McCall, LRT,CTRS Location: [address] Dayroom   Goal Area(s) Addresses:  Patient will effectively work in a team with other group members. Patient will verbalize importance of using appropriate problem solving techniques.  Patient will identify positive change associated with effective problem solving skills.   Group Description: Brain Teasers. Patients were given two sheets of brain teasers. Patients worked together to Radiation protection practitioner.     Education Outcome: Acknowledges understanding/In group clarification offered/Needs additional education.    Affect/Mood: Appropriate   Participation Level: Engaged   Participation Quality: Independent   Behavior: Appropriate   Speech/Thought Process: Focused   Insight: Good   Judgement: Good   Modes of Intervention: Group work   Patient Response to Interventions:  Engaged   Education Outcome:  In group clarification offered    Clinical Observations/Individualized Feedback: Pt came late to group but joined right in with the activity. Pt was smiling and interactive throughout group.     Plan: Continue to engage patient in RT group sessions 2-3x/week.   Maeby Vankleeck-McCall, LRT,CTRS 01/22/2023 11:41 AM

## 2023-01-22 NOTE — Plan of Care (Signed)
°  Problem: Education: °Goal: Emotional status will improve °Outcome: Progressing °Goal: Mental status will improve °Outcome: Progressing °  °Problem: Activity: °Goal: Interest or engagement in activities will improve °Outcome: Progressing °  °

## 2023-01-22 NOTE — Group Note (Signed)
Date:  01/22/2023 Time:  4:43 PM  Group Topic/Focus:  Making Healthy Choices:   The focus of this group is to help patients identify negative/unhealthy choices they were using prior to admission and identify positive/healthier coping strategies to replace them upon discharge.    Participation Level:  Minimal  Participation Quality:  Attentive  Affect:  Appropriate  Cognitive:  Appropriate  Insight: Appropriate  Engagement in Group:  Limited  Modes of Intervention:  Education  Additional Comments:     Reymundo Poll 01/22/2023, 4:43 PM

## 2023-01-22 NOTE — Progress Notes (Signed)
   01/21/23 2030  Psych Admission Type (Psych Patients Only)  Admission Status Voluntary  Psychosocial Assessment  Patient Complaints Anxiety;Depression  Eye Contact Fair  Facial Expression Animated  Affect Appropriate to circumstance  Speech Logical/coherent  Interaction Assertive  Motor Activity Slow  Appearance/Hygiene Unremarkable  Behavior Characteristics Cooperative;Appropriate to situation  Mood Pleasant  Thought Process  Coherency WDL  Content WDL  Delusions None reported or observed  Perception WDL  Hallucination None reported or observed  Judgment Impaired  Confusion WDL  Danger to Self  Current suicidal ideation? Denies  Agreement Not to Harm Self Yes  Description of Agreement degrees  Danger to Others  Danger to Others None reported or observed

## 2023-01-22 NOTE — Progress Notes (Signed)
Psychiatric progress note  Patient Identification: Kristina Hardy MRN:  [id number] Date of Evaluation:  01/22/2023 Chief Complaint:  MDD (major depressive disorder) [F32.9] Principal Diagnosis: MDD (major depressive disorder) Diagnosis:  Principal Problem:   MDD (major depressive disorder)  Reason for admission   The patient was admitted to [facility] adult inpatient unit on a voluntary status.  She was referred from the ED where she had a psych consult.  She was admitted for depression, passive suicidal ideations and possible psychosis.  This was in the context of substance use disorder and recent stress related to relationship.  Chart review from last 24 hours   Staff reports the patient slept better after she was moved to a different room.  She reports improved symptoms.  Received hydroxyzine,ibuprofen as PRNs. Yesterday, the psychiatry team made the following recommendations:  Continue home medications as prescribed. Continue Suboxone 8-2 mg daily Clonidine 0.1 mg 3 times daily Switch Abilify 5 mg QHS Wound care consultation Switch Cymbalta 30 mg QHS  Information obtained during interview   The patient was seen and evaluated today and the chart was reviewed.  She reports that her mood and anxiety have improved with her current medication regimen. She expresses concern that the medication is causing her to feel drowsy throughout the day and she requested that she have her doses moved to the evening. She has been eating and sleeping well.  Associated Signs/Symptoms: Depression Symptoms:  depressed mood, psychomotor agitation, difficulty concentrating, suicidal thoughts without plan, loss of energy/fatigue, disturbed sleep, (Hypo) Manic Symptoms:  Distractibility, Impulsivity, Irritable Mood, Anxiety Symptoms:  Excessive Worry, Psychotic Symptoms:  Paranoia, PTSD Symptoms: Negative Total Time spent with patient: 30 minutes  Past Psychiatric History: Patient gives a positive  history of depression and history of multiple contacts with the ED for any of the substance use disorder, back pain or suicidal ideations but claims that she was never hospitalized.  Is the patient at risk to self? Yes.    Has the patient been a risk to self in the past 6 months? Yes.    Has the patient been a risk to self within the distant past? Yes.    Is the patient a risk to others? No.  Has the patient been a risk to others in the past 6 months? No.  Has the patient been a risk to others within the distant past? No.   [place] Scale:  Flowsheet Row [facility] ([facility]) [facility] from 01/14/2023 in [facility] Department at [facility] ED from 01/07/2023 in [facility] Emergency Department at [facility]  C-SSRS RISK CATEGORY No Risk No Risk No Risk        Prior Inpatient Therapy: No. If yes, describe patient had multiple ED contacts but no hospitalization. Prior Outpatient Therapy: Yes.   If yes, describe patient has been noncompliant.  She does go to the Suboxone clinic for the last 2 years.  Alcohol Screening:   Substance Abuse History in the last 12 months:  Yes.   Consequences of Substance Abuse: Negative Previous Psychotropic Medications: Yes  Psychological Evaluations: No  Past Medical History:  Past Medical History:  Diagnosis Date   Anxiety    Chronic back pain    Depression    Endometriosis    Hepatitis C    Hyperthyroidism     Past Surgical History:  Procedure Laterality Date   RHINOPLASTY     TONSILLECTOMY     Family  History: No family history on file. Family Psychiatric  History: No pertinent psychiatric history. Tobacco Screening:  Social History   Tobacco Use  Smoking Status Every Day   Current packs/day: 1.00   Types: Cigarettes  Smokeless Tobacco Not on file    BH Tobacco Counseling     Are you interested in Tobacco Cessation Medications?  No value  filed. Counseled patient on smoking cessation:  No value filed. Reason Tobacco Screening Not Completed: No value filed.       Social History:  Social History   Substance and Sexual Activity  Alcohol Use No     Social History   Substance and Sexual Activity  Drug Use No   Comment: hx of opiod abuse    Additional Social History: Marital status: Single Are you sexually active?: No What is your sexual orientation?: henterosexual Has your sexual activity been affected by drugs, alcohol, medication, or emotional stress?: none reported Does patient have children?: Yes How many children?: 3 How is patient's relationship with their children?: Patient is not close with 2 adult children, talks to daughter, who lives nearby, occasionally.                         Allergies:   Allergies  Allergen Reactions   Nsaids Other (See Comments)    Stomach pain when taken on empty stomach   Robaxin [Methocarbamol] Nausea Only   Vicodin [Hydrocodone-Acetaminophen] Nausea And Vomiting and Rash   Lab Results:  Results for orders placed or performed during the hospital encounter of 01/16/23 (from the past 48 hour(s))  CBC     Status: None   Collection Time: 01/21/23  6:28 AM  Result Value Ref Range   WBC 7.1 4.0 - 10.5 K/uL   RBC 4.16 3.87 - 5.11 MIL/uL   Hemoglobin 12.8 12.0 - 15.0 g/dL   HCT 06.3 01.6 - 01.0 %   MCV 93.3 80.0 - 100.0 fL   MCH 30.8 26.0 - 34.0 pg   MCHC 33.0 30.0 - 36.0 g/dL   RDW 93.2 35.5 - 73.2 %   Platelets 327 150 - 400 K/uL   nRBC 0.0 0.0 - 0.2 %    Comment: Performed at [facility], [address], [place], [place] [address]  Differential     Status: None   Collection Time: 01/21/23  6:28 AM  Result Value Ref Range   Neutrophils Relative % 52 %   Neutro Abs 3.8 1.7 - 7.7 K/uL   Lymphocytes Relative 37 %   Lymphs Abs 2.6 0.7 - 4.0 K/uL   Monocytes Relative 8 %   Monocytes Absolute 0.6 0.1 - 1.0 K/uL   Eosinophils Relative 2 %    Eosinophils Absolute 0.1 0.0 - 0.5 K/uL   Basophils Relative 1 %   Basophils Absolute 0.0 0.0 - 0.1 K/uL   Immature Granulocytes 0 %   Abs Immature Granulocytes 0.00 0.00 - 0.07 K/uL    Comment: Performed at [facility], [address], [place], [place] [address]     Blood Alcohol level:  Lab Results  Component Value Date   [facility] <10 10/18/2022   ETH <10 09/10/2022    Metabolic Disorder Labs:  Lab Results  Component Value Date   HGBA1C 5.5 01/18/2023   MPG 111.15 01/18/2023   No results found for: "PROLACTIN" Lab Results  Component Value Date   CHOL 197 01/18/2023   TRIG 80 01/18/2023   HDL 70 01/18/2023  CHOLHDL 2.8 01/18/2023   VLDL 16 01/18/2023   LDLCALC 111 (H) 01/18/2023    Current Medications: Current Facility-Administered Medications  Medication Dose Route Frequency Provider Last Rate Last Admin   alum & mag hydroxide-simeth (MAALOX/MYLANTA) 200-200-20 MG/5ML suspension 30 mL  30 mL Oral Q4H PRN [name], NP       [START ON 01/23/2023] ARIPiprazole (ABILIFY) tablet 5 mg  5 mg Oral QHS Tamel Abel L, DO       buprenorphine-naloxone (SUBOXONE) 8-2 mg per SL tablet 1 tablet  1 tablet Sublingual Daily [name], NP   1 tablet at 01/22/23 0901   cloNIDine (CATAPRES) tablet 0.1 mg  0.1 mg Oral TID PRN [name], MD       diphenhydrAMINE (BENADRYL) capsule 50 mg  50 mg Oral TID PRN [name], NP       Or   diphenhydrAMINE (BENADRYL) injection 50 mg  50 mg Intramuscular TID PRN [name], NP       [START ON 01/23/2023] DULoxetine (CYMBALTA) DR capsule 30 mg  30 mg Oral QHS Danice Dippolito L, DO       fluticasone (FLONASE) 50 MCG/ACT nasal spray 1 spray  1 spray Each Nare Daily [name], MD   1 spray at 01/22/23 0901   haloperidol (HALDOL) tablet 5 mg  5 mg Oral TID PRN [name], NP       Or   haloperidol lactate (HALDOL) injection 5 mg  5 mg Intramuscular TID PRN [name], NP        hydrOXYzine (ATARAX) tablet 25 mg  25 mg Oral TID PRN [name], NP   25 mg at 01/20/23 1350   ibuprofen (ADVIL) tablet 400 mg  400 mg Oral Q4H PRN [name], MD   400 mg at 01/21/23 0801   LORazepam (ATIVAN) tablet 2 mg  2 mg Oral TID PRN [name], NP       Or   LORazepam (ATIVAN) injection 2 mg  2 mg Intramuscular TID PRN [name], NP       magnesium hydroxide (MILK OF MAGNESIA) suspension 30 mL  30 mL Oral Daily PRN [name], NP       nicotine (NICODERM CQ - dosed in mg/24 hours) patch 21 mg  21 mg Transdermal Daily [name], NP   21 mg at 01/22/23 1010   nicotine polacrilex (NICORETTE) gum 2 mg  2 mg Oral PRN [name], MD   2 mg at 01/22/23 1018   ondansetron (ZOFRAN-ODT) disintegrating tablet 4 mg  4 mg Oral Q8H PRN [name], MD   4 mg at 01/19/23 1609   pantoprazole (PROTONIX) EC tablet 40 mg  40 mg Oral Daily [name], NP   40 mg at 01/22/23 0901   traZODone (DESYREL) tablet 50 mg  50 mg Oral QHS PRN [name], NP   50 mg at 01/17/23 2119   PTA Medications: Medications Prior to Admission  Medication Sig Dispense Refill Last Dose   Buprenorphine HCl-Naloxone HCl 8-2 MG FILM Place 1-2.5 Film under the tongue daily.      cephALEXin (KEFLEX) 500 MG capsule Take 500 mg by mouth 4 (four) times daily.      cloNIDine (CATAPRES) 0.1 MG tablet Take 0.1 mg by mouth 3 (three) times daily as needed (anxiety).      fluticasone (FLONASE) 50 MCG/ACT nasal spray Place 1 spray into both nostrils daily. (Patient not taking: Reported on 01/15/2023) 16 g 2    ibuprofen (ADVIL)  200 MG tablet Take 400-800 mg by mouth 2 (two) times daily as needed for moderate pain.      omeprazole (PRILOSEC) 20 MG capsule Take 1 capsule (20 mg total) by mouth daily. (Patient not taking: Reported on 01/15/2023) 30 capsule 0    ondansetron (ZOFRAN-ODT) 8 MG disintegrating tablet Take 8 mg by mouth 2 (two) times daily as needed for nausea or vomiting.       risperiDONE (RISPERDAL) 1 MG tablet Take 1 mg by mouth at bedtime as needed (mood).       Musculoskeletal: Strength & Muscle Tone: within normal limits Gait & Station: normal Patient leans: N/A            Psychiatric Specialty Exam:  Presentation  General Appearance:  Appropriate for Environment  Eye Contact: Good  Speech: Normal Rate  Speech Volume: Normal  Handedness: Right   Mood and Affect  Mood: Anxious  Affect: Appropriate   Thought Process  Thought Processes: Linear  Duration of Psychotic Symptoms:N/A Past Diagnosis of Schizophrenia or Psychoactive disorder: No  Descriptions of Associations:Intact  Orientation:Full (Time, Place and Person)  Thought Content:Logical  Hallucinations:Hallucinations: None Description of Visual Hallucinations: Patient was seeing Muppets by self-report but denies any today.  Ideas of Reference:None  Suicidal Thoughts:Suicidal Thoughts: No  Homicidal Thoughts:Homicidal Thoughts: No   Sensorium  Memory: Immediate Fair; Recent Fair; Remote Fair  Judgment: Good  Insight: Good   Executive Functions  Concentration: Good  Attention Span: Good  Recall: Good  Fund of Knowledge: Good  Language: Good   Psychomotor Activity  Psychomotor Activity: Psychomotor Activity: Normal   Assets  Assets: Communication Skills; Desire for Improvement   Sleep  Sleep: Sleep: Good Number of Hours of Sleep: 6    Physical Exam: Physical Exam Constitutional:      Appearance: Normal appearance. She is normal weight.  HENT:     Head: Normocephalic.  Skin:    Findings: Lesion present.     Comments: Has an infected wound on the left forearm  Neurological:     General: No focal deficit present.     Mental Status: She is alert and oriented to person, place, and time. Mental status is at baseline.    Review of Systems  Psychiatric/Behavioral:  Positive for depression, substance abuse and  suicidal ideas. The patient is nervous/anxious.   All other systems reviewed and are negative.  Blood pressure 107/76, pulse 86, temperature 97.9 F (36.6 C), temperature source Oral, resp. rate 16, height 5\' 2"  (1.575 m), weight 44.9 kg, last menstrual period 11/17/2011, SpO2 100%. Body mass index is 18.11 kg/m.  Treatment Plan Summary: Daily contact with patient to assess and evaluate symptoms and progress in treatment, Medication management, and Plan the patient is admitted to the safe and secure environment for further observation of her depression and suicidal ideations.  She is contracting for safety.  She wants to be maintained on the Suboxone but is willing to consider an antidepressant although SSRIs are ruled out because of possible paradoxical reaction or increased suicidal ideations.  Observation Level/Precautions:  15 minute checks  Laboratory:  CBC HbAIC HCG UDS  Psychotherapy: Group therapy.  An inpatient  Medications: The patient is being discharged on the following medications. Suboxone 8-2 1 tablet twice daily Clonidine 0.1 mg p.o. 3 times daily only as a as needed. Doxycycline Vibra-Tabs 100 mg every 12 hours NicoDerm CQ 21 mg per 24 hours. Protonix 40 mg a day Discontinue Risperdal 1 mg at bedtime Continue Abilify to  5 mg taking Continue Cymbalta 30 mg p.o. daily.  This may also help her with her pain.   Ibuprofen for pain Wound care consult was completed today.  I appreciate the consult from [facility] Consult.  Consultations: Wound care consult completed.  Discharge Concerns: Possible relapse.  Estimated LOS: Possible discharge by Monday  Other:     Physician Treatment Plan for Primary Diagnosis: MDD (major depressive disorder) Long Term Goal(s): Improvement in symptoms so as ready for discharge  Short Term Goals: Ability to identify changes in lifestyle to reduce recurrence of condition will improve, Ability to disclose and discuss suicidal ideas, Ability to  demonstrate self-control will improve, Ability to identify and develop effective coping behaviors will improve, and Compliance with prescribed medications will improve  Physician Treatment Plan for Secondary Diagnosis: Principal Problem:   MDD (major depressive disorder)  Long Term Goal(s): Improvement in symptoms so as ready for discharge  Short Term Goals: Ability to identify changes in lifestyle to reduce recurrence of condition will improve, Ability to demonstrate self-control will improve, Ability to identify and develop effective coping behaviors will improve, Compliance with prescribed medications will improve, and Ability to identify triggers associated with substance abuse/mental health issues will improve  I certify that inpatient services furnished can reasonably be expected to improve the patient's condition.    Harlin Heys, DO 10/18/202411:35 AM Patient ID: [name], female   DOB: 13-May-1970, 52 y.o.   MRN: [id number] Patient ID: Sunee Mcconahy, female   DOB: 21-Jun-1970, 52 y.o.   MRN: [id number] Patient ID: Catheryn Younis, female   DOB: [birth year], 52 y.o.   MRN: [id number] Patient ID: Brayana Toms, female   DOB: 10-09-1970, 52 y.o.   MRN: [id number]

## 2023-01-22 NOTE — Progress Notes (Signed)
   01/22/23 0900  Psych Admission Type (Psych Patients Only)  Admission Status Voluntary  Psychosocial Assessment  Patient Complaints Anxiety;Depression  Eye Contact Fair  Facial Expression Animated  Affect Appropriate to circumstance  Speech Logical/coherent  Interaction Assertive  Motor Activity Fidgety  Appearance/Hygiene Unremarkable  Behavior Characteristics Cooperative;Appropriate to situation  Mood Pleasant  Thought Process  Coherency WDL  Content WDL  Delusions None reported or observed  Perception WDL  Hallucination None reported or observed  Judgment Impaired  Confusion None  Danger to Self  Current suicidal ideation? Denies  Agreement Not to Harm Self Yes  Description of Agreement Pt verbally contracts for safety  Danger to Others  Danger to Others None reported or observed

## 2023-01-23 MED ORDER — ARIPIPRAZOLE 2 MG PO TABS
2.0000 mg | ORAL_TABLET | Freq: Every day | ORAL | Status: DC
  Filled 2023-01-23 (×2): qty 1

## 2023-01-23 MED ORDER — PROPRANOLOL HCL 10 MG PO TABS
10.0000 mg | ORAL_TABLET | Freq: Two times a day (BID) | ORAL | Status: DC
  Administered 2023-01-23 – 2023-01-24 (×2): 10 mg via ORAL
  Filled 2023-01-23 (×6): qty 1

## 2023-01-23 MED ORDER — LORATADINE 10 MG PO TABS
10.0000 mg | ORAL_TABLET | Freq: Every day | ORAL | Status: DC
  Administered 2023-01-23 – 2023-01-24 (×2): 10 mg via ORAL
  Filled 2023-01-23 (×6): qty 1

## 2023-01-23 NOTE — Progress Notes (Signed)
Kristina Hardy, Denies SI/HI/AVH Vital signs stable.She is irritable and argumentative this morning.Taking scheduled medicine. Received Vistaril 25 mg for anxiety per her request

## 2023-01-23 NOTE — Progress Notes (Signed)
   01/22/23 2019  Psych Admission Type (Psych Patients Only)  Admission Status Voluntary  Psychosocial Assessment  Patient Complaints Anxiety;Depression  Eye Contact Fair  Facial Expression Animated;Anxious  Affect Appropriate to circumstance  Speech Logical/coherent  Interaction Assertive  Motor Activity Fidgety  Appearance/Hygiene Unremarkable  Behavior Characteristics Cooperative;Appropriate to situation  Mood Pleasant  Thought Process  Coherency WDL  Content WDL  Delusions None reported or observed  Perception WDL  Hallucination None reported or observed  Judgment Impaired  Confusion None  Danger to Self  Current suicidal ideation? Denies  Agreement Not to Harm Self Yes  Danger to Others  Danger to Others None reported or observed

## 2023-01-23 NOTE — Progress Notes (Signed)
Psychiatric progress note  Patient Identification: Kristina Hardy MRN:  [id number] Date of Evaluation:  01/23/2023 Chief Complaint:  MDD (major depressive disorder) [F32.9] Principal Diagnosis: MDD (major depressive disorder) Diagnosis:  Principal Problem:   MDD (major depressive disorder)  Reason for admission   The patient was admitted to [facility] adult inpatient unit on a voluntary status.  She was referred from the ED where she had a psych consult.  She was admitted for depression, passive suicidal ideations and possible psychosis. Patient has a PPH of polysubstance abuse including meth, BZD, and opiates.   This was in the context of substance use disorder and recent stress related to relationship.  Chart review from last 24 hours   Overnight patient had no behavior events. Patient required the following PRNs: Hydroxyzine 25mg  (once) and Nicorette gum (once).  Yesterday, the psychiatry team made the following recommendations:  Continue home medications as prescribed. Continue Suboxone 8-2 mg daily Clonidine 0.1 mg 3 times daily Switch Abilify 5 mg QHS Wound care consultation Switch Cymbalta 30 mg QHS  Information obtained during interview   The patient was seen and evaluated today and the chart was reviewed. Today patient has reported to both RN and MD that she feels as though her skin is crawling, and like she wants to come out of her skin. Patient reports that she also feels restless. Patient reports that she has had this feeling every since her Abilify was increased to 5mg . Patient reports that she does not want to take the medication anymore. Patient reports that she is not as depressed but is very anxious. Patient denies SI, HI, and AVH. Patient reports that in regards to the things she was worried/ paranoid about at presentation, she endorses that she is spending less time thinking about these things and also reports that she is no longer sure that her worries were warranted to the  degree she was concerned.   Associated Signs/Symptoms: Depression Symptoms:  depressed mood, psychomotor agitation, difficulty concentrating, suicidal thoughts without plan, loss of energy/fatigue, disturbed sleep, (Hypo) Manic Symptoms:  Distractibility, Impulsivity, Irritable Mood, Anxiety Symptoms:  Excessive Worry, Psychotic Symptoms:  Paranoia, PTSD Symptoms: Negative Total Time spent with patient: 30 minutes  Past Psychiatric History: Patient gives a positive history of depression and history of multiple contacts with the ED for any of the substance use disorder, back pain or suicidal ideations but claims that she was never hospitalized.  Is the patient at risk to self? Yes.    Has the patient been a risk to self in the past 6 months? Yes.    Has the patient been a risk to self within the distant past? Yes.    Is the patient a risk to others? No.  Has the patient been a risk to others in the past 6 months? No.  Has the patient been a risk to others within the distant past? No.   [place] Scale:  Flowsheet Row [facility] ([facility]) [facility] from 01/14/2023 in [facility] Department at [facility] ED from 01/07/2023 in [facility] Department at [facility]  C-SSRS RISK CATEGORY No Risk No Risk No Risk        Prior Inpatient Therapy: No. If yes, describe patient had multiple ED contacts but no hospitalization. Prior Outpatient Therapy: Yes.   If yes, describe patient has been noncompliant.  She does go to the Suboxone clinic for the last 2 years.  Alcohol Screening:  Substance Abuse History in the last 12 months:  Yes.   Consequences of Substance Abuse: Negative Previous Psychotropic Medications: Yes  Psychological Evaluations: No  Past Medical History:  Past Medical History:  Diagnosis Date   Anxiety    Chronic back pain    Depression    Endometriosis    Hepatitis C     Hyperthyroidism     Past Surgical History:  Procedure Laterality Date   RHINOPLASTY     TONSILLECTOMY     Family History: No family history on file. Family Psychiatric  History: No pertinent psychiatric history. Tobacco Screening:  Social History   Tobacco Use  Smoking Status Every Day   Current packs/day: 1.00   Types: Cigarettes  Smokeless Tobacco Not on file    BH Tobacco Counseling     Are you interested in Tobacco Cessation Medications?  No value filed. Counseled patient on smoking cessation:  No value filed. Reason Tobacco Screening Not Completed: No value filed.       Social History:  Social History   Substance and Sexual Activity  Alcohol Use No     Social History   Substance and Sexual Activity  Drug Use No   Comment: hx of opiod abuse    Additional Social History: Marital status: Single Are you sexually active?: No What is your sexual orientation?: henterosexual Has your sexual activity been affected by drugs, alcohol, medication, or emotional stress?: none reported Does patient have children?: Yes How many children?: 3 How is patient's relationship with their children?: Patient is not close with 2 adult children, talks to daughter, who lives nearby, occasionally.                         Allergies:   Allergies  Allergen Reactions   Nsaids Other (See Comments)    Stomach pain when taken on empty stomach   Robaxin [Methocarbamol] Nausea Only   Vicodin [Hydrocodone-Acetaminophen] Nausea And Vomiting and Rash   Lab Results:  No results found for this or any previous visit (from the past 48 hour(s)).    Blood Alcohol level:  Lab Results  Component Value Date   ETH <10 10/18/2022   ETH <10 09/10/2022    Metabolic Disorder Labs:  Lab Results  Component Value Date   HGBA1C 5.5 01/18/2023   MPG 111.15 01/18/2023   No results found for: "PROLACTIN" Lab Results  Component Value Date   CHOL 197 01/18/2023   TRIG 80 01/18/2023    HDL 70 01/18/2023   CHOLHDL 2.8 01/18/2023   VLDL 16 01/18/2023   LDLCALC 111 (H) 01/18/2023    Current Medications: Current Facility-Administered Medications  Medication Dose Route Frequency Provider Last Rate Last Admin   alum & mag hydroxide-simeth (MAALOX/MYLANTA) 200-200-20 MG/5ML suspension 30 mL  30 mL Oral Q4H PRN [name], NP       buprenorphine-naloxone (SUBOXONE) 8-2 mg per SL tablet 1 tablet  1 tablet Sublingual Daily [name], NP   1 tablet at 01/23/23 1610   cloNIDine (CATAPRES) tablet 0.1 mg  0.1 mg Oral TID PRN [name], MD       diphenhydrAMINE (BENADRYL) capsule 50 mg  50 mg Oral TID PRN [name], NP       Or   diphenhydrAMINE (BENADRYL) injection 50 mg  50 mg Intramuscular TID PRN [name], NP       DULoxetine (CYMBALTA) DR capsule 30 mg  30 mg Oral QHS [name], [name], DO  fluticasone (FLONASE) 50 MCG/ACT nasal spray 1 spray  1 spray Each Nare Daily [name], MD   1 spray at 01/22/23 0901   haloperidol (HALDOL) tablet 5 mg  5 mg Oral TID PRN [name], NP       Or   haloperidol lactate (HALDOL) injection 5 mg  5 mg Intramuscular TID PRN [name], NP       hydrOXYzine (ATARAX) tablet 25 mg  25 mg Oral TID PRN [name], NP   25 mg at 01/23/23 0831   ibuprofen (ADVIL) tablet 400 mg  400 mg Oral Q4H PRN [name], MD   400 mg at 01/21/23 0801   loratadine (CLARITIN) tablet 10 mg  10 mg Oral Daily Bobbye Morton, MD       LORazepam (ATIVAN) tablet 2 mg  2 mg Oral TID PRN [name], NP       Or   LORazepam (ATIVAN) injection 2 mg  2 mg Intramuscular TID PRN [name], NP       magnesium hydroxide (MILK OF MAGNESIA) suspension 30 mL  30 mL Oral Daily PRN [name], NP       nicotine (NICODERM CQ - dosed in mg/24 hours) patch 21 mg  21 mg Transdermal Daily [name], NP   21 mg at 01/23/23 2440   nicotine polacrilex (NICORETTE) gum 2 mg  2 mg Oral PRN [name], MD   2 mg at 01/23/23 0908   ondansetron (ZOFRAN-ODT) disintegrating tablet 4 mg  4 mg Oral Q8H PRN [name], MD   4 mg at 01/19/23 1609   pantoprazole (PROTONIX) EC tablet 40 mg  40 mg Oral Daily [name], NP   40 mg at 01/23/23 0831   propranolol (INDERAL) tablet 10 mg  10 mg Oral BID Eliseo Gum B, MD   10 mg at 01/23/23 1356   traZODone (DESYREL) tablet 50 mg  50 mg Oral QHS PRN [name], NP   50 mg at 01/17/23 2119   PTA Medications: Medications Prior to Admission  Medication Sig Dispense Refill Last Dose   Buprenorphine HCl-Naloxone HCl 8-2 MG FILM Place 1-2.5 Film under the tongue daily.      cephALEXin (KEFLEX) 500 MG capsule Take 500 mg by mouth 4 (four) times daily.      cloNIDine (CATAPRES) 0.1 MG tablet Take 0.1 mg by mouth 3 (three) times daily as needed (anxiety).      fluticasone (FLONASE) 50 MCG/ACT nasal spray Place 1 spray into both nostrils daily. (Patient not taking: Reported on 01/15/2023) 16 g 2    ibuprofen (ADVIL) 200 MG tablet Take 400-800 mg by mouth 2 (two) times daily as needed for moderate pain.      omeprazole (PRILOSEC) 20 MG capsule Take 1 capsule (20 mg total) by mouth daily. (Patient not taking: Reported on 01/15/2023) 30 capsule 0    ondansetron (ZOFRAN-ODT) 8 MG disintegrating tablet Take 8 mg by mouth 2 (two) times daily as needed for nausea or vomiting.      risperiDONE (RISPERDAL) 1 MG tablet Take 1 mg by mouth at bedtime as needed (mood).       Musculoskeletal: Strength & Muscle Tone: within normal limits Gait & Station: normal Patient leans: N/A            Psychiatric Specialty Exam:  Presentation  General Appearance:  Appropriate for Environment; Casual  Eye Contact: Good  Speech: Clear and Coherent  Speech Volume: Normal  Handedness: Right  Mood and Affect  Mood: Anxious  Affect: Appropriate   Thought Process  Thought Processes: Coherent  Duration of Psychotic  Symptoms:N/A Past Diagnosis of Schizophrenia or Psychoactive disorder: No  Descriptions of Associations:Intact  Orientation:Full (Time, Place and Person)  Thought Content:Logical  Hallucinations:Hallucinations: None  Ideas of Reference:None  Suicidal Thoughts:Suicidal Thoughts: No  Homicidal Thoughts:Homicidal Thoughts: No   Sensorium  Memory: Immediate Fair; Recent Fair  Judgment: Fair  Insight: Fair   Art therapist  Concentration: Good  Attention Span: Good  Recall: Good  Fund of Knowledge: Good  Language: Good   Psychomotor Activity  Psychomotor Activity: Psychomotor Activity: Extrapyramidal Side Effects (EPS) Extrapyramidal Side Effects (EPS): Akathisia   Assets  Assets: Communication Skills; Desire for Improvement   Sleep  Sleep: Sleep: Good    Physical Exam: Physical Exam Constitutional:      Appearance: Normal appearance. She is normal weight.  HENT:     Head: Normocephalic and atraumatic.  Pulmonary:     Effort: Pulmonary effort is normal.  Skin:    Findings: Lesion present.     Comments: Has an infected wound on the left forearm  Neurological:     General: No focal deficit present.     Mental Status: She is alert and oriented to person, place, and time. Mental status is at baseline.    Review of Systems  Psychiatric/Behavioral:  Positive for substance abuse. Negative for depression and suicidal ideas. The patient is nervous/anxious.   All other systems reviewed and are negative.  Blood pressure 109/81, pulse 69, temperature 97.9 F (36.6 C), temperature source Oral, resp. rate 17, height 5\' 2"  (1.575 m), weight 44.9 kg, last menstrual period 11/17/2011, SpO2 99%. Body mass index is 18.11 kg/m.  Treatment Plan Summary:  MDD, recurrent, severe Opioid use d/o Stimulant abuse (meth) Sedative-hypnotic abuse (BZD)  Daily contact with patient to assess and evaluate symptoms and progress in treatment, Medication  management, and Plan the patient is admitted to the safe and secure environment for further observation of her depression and suicidal ideations.  She is contracting for safety.  She wants to be maintained on the Suboxone but is willing to consider an antidepressant although SSRIs are ruled out because of possible paradoxical reaction or increased suicidal ideations.  After discussion it appears that patient is having akathisia 2/2  Abilify. Patient  does not want to continue. Will start Propanolol and discontinue Abilify. Will re-evaluate tom for mood stabilizing medication due to concerns for Bipolar d/o, patient endorses a hx of possible increase in impulsivity on SSRIs. Propanolol should help with akathisia and any underlying anxiety symptoms.  Briefly considered Trileptal and tegretol but these will interact with patient suboxone.   Will also start Claritin, patient endorses allergies with occasional running nose and eyes.   Observation Level/Precautions:  15 minute checks  Laboratory:  CBC HbAIC HCG UDS  Psychotherapy: Group therapy.  An inpatient  Medications: The patient is being discharged on the following medications. Suboxone 8-2 1 tablet twice daily Clonidine 0.1 mg p.o. 3 times daily only as a as needed. Doxycycline Vibra-Tabs 100 mg every 12 hours NicoDerm CQ 21 mg per 24 hours. Protonix 40 mg a day Discontinue Risperdal 1 mg at bedtime (done 10/15) Discontinue Abilify to 5 mg taking (10/19) Start Propanolol 10mg  bid for akathisia Continue Cymbalta 30 mg p.o. daily.  This may also help her with her pain.     Start Claritin 10mg  daily.  Ibuprofen for pain   Wound care consult was completed.  I appreciate the consult from [facility] Consult.  Consultations: Wound care consult completed.  Discharge Concerns: Possible relapse.  Estimated LOS: Possible discharge by Monday  Other:     Physician Treatment Plan for Primary Diagnosis: MDD (major depressive disorder) Long Term  Goal(s): Improvement in symptoms so as ready for discharge  Short Term Goals: Ability to identify changes in lifestyle to reduce recurrence of condition will improve, Ability to disclose and discuss suicidal ideas, Ability to demonstrate self-control will improve, Ability to identify and develop effective coping behaviors will improve, and Compliance with prescribed medications will improve  Physician Treatment Plan for Secondary Diagnosis: Principal Problem:   MDD (major depressive disorder)  Long Term Goal(s): Improvement in symptoms so as ready for discharge  Short Term Goals: Ability to identify changes in lifestyle to reduce recurrence of condition will improve, Ability to demonstrate self-control will improve, Ability to identify and develop effective coping behaviors will improve, Compliance with prescribed medications will improve, and Ability to identify triggers associated with substance abuse/mental health issues will improve  I certify that inpatient services furnished can reasonably be expected to improve the patient's condition.    Bobbye Morton, MD 10/19/20244:11 PM

## 2023-01-23 NOTE — Progress Notes (Signed)
   01/23/23 2035  Psych Admission Type (Psych Patients Only)  Admission Status Voluntary  Psychosocial Assessment  Patient Complaints Anxiety  Eye Contact Fair  Facial Expression Animated;Anxious  Affect Appropriate to circumstance  Speech Logical/coherent  Interaction Assertive  Motor Activity Fidgety;Restless  Appearance/Hygiene Unremarkable  Behavior Characteristics Cooperative;Appropriate to situation  Mood Anxious  Thought Process  Coherency WDL  Content WDL  Delusions None reported or observed  Perception WDL  Hallucination None reported or observed  Judgment Impaired  Confusion None  Danger to Self  Current suicidal ideation? Denies  Agreement Not to Harm Self Yes  Description of Agreement verbal  Danger to Others  Danger to Others None reported or observed

## 2023-01-23 NOTE — Plan of Care (Signed)
°  Problem: Education: °Goal: Knowledge of [facility] Education information/materials will improve °Outcome: Not Progressing °Goal: Emotional status will improve °Outcome: Not Progressing °Goal: Mental status will improve °Outcome: Not Progressing °Goal: Verbalization of understanding the information provided will improve °Outcome: Not Progressing °  °

## 2023-01-23 NOTE — [facility] Group Notes (Signed)
[facility] Group Notes:  (Nursing/MHT/Case Management/Adjunct)  Date:  01/23/2023  Time:  2:25 PM  Type of Therapy:  Psychoeducational Skills  Participation Level:  Active  Participation Quality:  Appropriate  Affect:  Appropriate  Cognitive:  Alert and Appropriate  Insight:  Appropriate  Engagement in Group:  Engaged  Modes of Intervention:  Discussion, Education, and Exploration  Summary of Progress/Problems:  Patients were educated on positive reframing, and the impacts it can have on mental well being. Pts were then given a podcast to listen to by '' [employer] with Berniece Pap '' to identify healthy behaviors and habits to impact mental health. Pt attended and was appropriate.   Kristina Hardy 01/23/2023, 2:25 PM

## 2023-01-23 NOTE — [facility] Group Notes (Signed)
[facility] Group Notes:  (Nursing/MHT/Case Management/Adjunct)  Date:  01/23/2023  Time:  9:55 PM  Type of Therapy:   Wrap-up group  Participation Level:  Active  Participation Quality:  Appropriate  Affect:  Appropriate  Cognitive:  Appropriate  Insight:  Appropriate  Engagement in Group:  Engaged  Modes of Intervention:  Education  Summary of Progress/Problems: Pt goal to identify why she is feeling anxious. Rated her day 8/10.   Noah Delaine 01/23/2023, 9:55 PM

## 2023-01-23 NOTE — Group Note (Unsigned)
Date:  01/23/2023 Time:  2:35 PM  Group Topic/Focus:  Goals Group:   The focus of this group is to help patients establish daily goals to achieve during treatment and discuss how the patient can incorporate goal setting into their daily lives to aide in recovery.     Participation Level:  {[facility] PARTICIPATION XLKGM:01027}  Participation Quality:  {[facility] PARTICIPATION QUALITY:22265}  Affect:  {[facility] AFFECT:22266}  Cognitive:  {[facility] COGNITIVE:22267}  Insight: {BHH Insight2:20797}  Engagement in Group:  {BHH ENGAGEMENT IN OZDGU:44034}  Modes of Intervention:  {B[facility] MODES OF INTERVENTION:22269}  Additional Comments:  ***  Reymundo Poll 01/23/2023, 2:35 PM

## 2023-01-24 MED ORDER — SODIUM CHLORIDE 0.9 % IN NEBU
INHALATION_SOLUTION | RESPIRATORY_TRACT | Status: AC
  Filled 2023-01-24: qty 3

## 2023-01-24 MED ORDER — LORATADINE 10 MG PO TABS: 10.0000 mg | ORAL_TABLET | Freq: Every day | ORAL | 0 refills | Status: DC

## 2023-01-24 MED ORDER — TRAZODONE HCL 50 MG PO TABS: 50.0000 mg | ORAL_TABLET | Freq: Every evening | ORAL | 0 refills | Status: DC | PRN

## 2023-01-24 MED ORDER — PROPRANOLOL HCL 10 MG PO TABS: 10.0000 mg | ORAL_TABLET | Freq: Two times a day (BID) | ORAL | 0 refills | Status: DC

## 2023-01-24 MED ORDER — HYDROXYZINE HCL 25 MG PO TABS: 25.0000 mg | ORAL_TABLET | Freq: Three times a day (TID) | ORAL | 0 refills | Status: DC | PRN

## 2023-01-24 MED ORDER — DULOXETINE HCL 30 MG PO CPEP: 30.0000 mg | ORAL_CAPSULE | Freq: Every day | ORAL | 0 refills | Status: DC

## 2023-01-24 MED ORDER — NICOTINE POLACRILEX 2 MG MT GUM: 2.0000 mg | CHEWING_GUM | OROMUCOSAL | 0 refills | Status: DC | PRN

## 2023-01-24 MED ORDER — NICOTINE 21 MG/24HR TD PT24: 21.0000 mg | MEDICATED_PATCH | Freq: Every day | TRANSDERMAL | 0 refills | Status: DC

## 2023-01-24 NOTE — Progress Notes (Signed)
  [facility] Adult Case Management Discharge Plan :  Will you be returning to the same living situation after discharge:  Yes,  Patient boyfriend Norm Salt will picking her up. At discharge, do you have transportation home?: Yes,  Patient will be staying with friends Do you have the ability to pay for your medications: Yes,  Patient has insurance  Release of information consent forms completed and in the chart;  Patient's signature needed at discharge.  Patient to Follow up at:  Follow-up Information     [place] Go to.   Specialty: Professional Counselor Why: Please go to this provider during walk in hours for new patients, 9 am to 1 pm, Monday through Friday, to receive therapy services. Contact information: [address] [place] [address] [phone]         [facility] Follow up on 02/10/2023.   Why: You have an appointment for medication management services on 02/10/23 at 10:00 am.  This will be a Virtual telehealth appt, but you may call to switch to in person. Contact information: [address] [place] [address] [phone]                 Next level of care provider has access to [facility] Link:no  Safety Planning and Suicide Prevention discussed: Yes,  CSW spoke to [name](bf) on 01/19/23     Has patient been referred to the Quitline?: Patient refused referral for treatment  Patient has been referred for addiction treatment: Patient refused referral for treatment.  Titianna Loomis O Eliam Snapp, LCSWA 01/24/2023, 10:17 AM

## 2023-01-24 NOTE — Group Note (Signed)
[facility] LCSW Group Therapy Note  01/24/2023  10:00-11:00AM  Type of Therapy and Topic:  Group Therapy:  Building Supports  Participation Level:  Active   Description of Group:  Patients in this group were introduced to the idea of adding a variety of healthy supports to address the various needs in their lives.  Different types of support were defined and described, and each type was acted out.  Patients discussed what additional healthy supports could be helpful in their recovery and wellness after discharge in order to prevent future hospitalizations.   An emphasis was placed on following up with the discharge plan when they leave the hospital in order to continue becoming healthier and happier.  Two songs were played during group to help further patients' understanding.  Therapeutic Goals: 1)  demonstrate the importance of adding supports  2)  discuss 4 definitions of support  3)  identify the patient's current level of healthy support and   4)  elicit commitments to add one healthy support   Summary of Patient Progress:  The patient expressed good comprehension of the concepts presented, and agreed that there is a need to add more supports.  Current supports were not ascertained because she was late to group.  The patient indicated one healthy support that could be helpful if added would be "eating healthy."  She participated at times and also focused on the ability of faith to be a good support.  Therapeutic Modalities:   Psychoeducation Brief Solution-Focused Therapy  Lynnell Chad, LCSW 01/24/2023 11:32 AM

## 2023-01-24 NOTE — Progress Notes (Signed)
Patient verbalizes readiness for discharge. All patient belongings returned to patient. Discharge instructions read and discussed with patient (appointments, medications, resources). Patient expressed gratitude for care provided. Patient discharged to lobby at 23 where boy friend was waiting.
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
## 2023-01-24 NOTE — [facility] Group Notes (Signed)
[facility] Group Notes:  (Nursing/MHT/Case Management/Adjunct)  Date:  01/24/2023  Time:  9:08 AM  Type of Therapy:   Goals group  Participation Level:  Minimal  Participation Quality:  Appropriate  Affect:  Appropriate  Cognitive:  Appropriate  Insight:  Limited  Engagement in Group:  None  Modes of Intervention:  Discussion and Orientation  Summary of Progress/Problems:  Azalee Course 01/24/2023, 9:08 AM

## 2023-01-24 NOTE — Discharge Summary (Signed)
Physician Discharge Summary Note  Patient:  Kristina Hardy is an 52 y.o., female MRN:  [id number] DOB:  [birth year] Patient phone:  [phone] (home)  Patient address:   [address] [place] [address],  Total Time spent with patient: 30 minutes  Date of Admission:  01/16/2023 Date of Discharge: 01/24/2023  Reason for Admission:  "worsening anxiety, and depression "  The patient is a 52 yo female who was admitted to [facility] adult inpatient unit on a voluntary status.  She was referred from the ED where she had a psych consult.  She was admitted for depression, passive suicidal ideations and possible psychosis.  This was in the context of substance use disorder and recent stress related to relationship.   Principal Problem: MDD (major depressive disorder) Discharge Diagnoses: Principal Problem:   MDD (major depressive disorder)  On discharge patient was denying SI, HI, and AVH and sleeping well. Patient appetite was stable and patient was not endorsing any symptoms of paranoia. Patient had logical and coherent thoughts.   Past Psychiatric History: Patient gives a positive history of depression and history of multiple contacts with the ED for any of the substance use disorder, back pain or suicidal ideations but claims that she was never hospitalized.  Prior Inpatient Therapy: No. If yes, describe patient had multiple ED contacts but no hospitalization. Prior Outpatient Therapy: Yes.   If yes, describe patient has been noncompliant.  She does go to the Suboxone clinic for the last 2 years.   Past Medical History:  Past Medical History:  Diagnosis Date   Anxiety    Chronic back pain    Depression    Endometriosis    Hepatitis C    Hyperthyroidism     Past Surgical History:  Procedure Laterality Date   RHINOPLASTY     TONSILLECTOMY     Family History: No family history on file. Family Psychiatric  History: No pertinent psychiatric history.  Social History:   Social History   Substance and Sexual Activity  Alcohol Use No     Social History   Substance and Sexual Activity  Drug Use No   Comment: hx of opiod abuse    Social History   Socioeconomic History   Marital status: Divorced    Spouse name: Not on file   Number of children: Not on file   Years of education: Not on file   Highest education level: Not on file  Occupational History   Not on file  Tobacco Use   Smoking status: Every Day    Current packs/day: 1.00    Types: Cigarettes   Smokeless tobacco: Not on file  Substance and Sexual Activity   Alcohol use: No   Drug use: No    Comment: hx of opiod abuse   Sexual activity: Not on file  Other Topics Concern   Not on file  Social History Narrative   ** Merged History Encounter **       Social Determinants of Health   Financial Resource Strain: Not on file  Food Insecurity: Not on file  Transportation Needs: Not on file  Physical Activity: Not on file  Stress: Not on file  Social Connections: Unknown (11/25/2021)   [facility] from [facility]   Social Network    Social Network: Not on file    Hospital Course:    Psychiatric diagnoses provided upon initial assessment:  MDD, recurrent, severe Opioid use d/o Stimulant abuse (meth) Sedative-hypnotic abuse (BZD)  Patient's psychiatric medications were adjusted on admission:  -- Continue Home medications: Suboxone 8-2 1 tablet twice daily Clonidine 0.1 mg p.o. 3 times daily only as a as needed. Protonix 40 mg a day  Doxycycline Vibra-Tabs 100 mg every 12 hours    -- Pssychotropic medications: -- Start Risperdal 1mg  at bedtime   During the hospitalization, other adjustments were made to the patient's psychiatric medication regimen:  -- Cymbalta DR 30mg  was started -- Risperdal 1mg  was discontinued due patient being concerned about increasing somnolence and decreasing BP -- Abilify 2 mg was started then titrated up to 5mg  however patient  had severe akathisia and this was discontinued as patient would no longer take -- As a result of akathisia patient was started on Propanolol 10mg  bid, but patient also found it helpful for general anxiety   Gradually, patient started adjusting to milieu.   Patient's care was discussed during the interdisciplinary team meeting every day during the hospitalization.   The patient endorsed having side effects to prescribed psychiatric medication Abilify and Risperdal as noted above.   The patient reports their target psychiatric symptoms of  anxiety and depression responded well to the psychiatric medications: Cymbalta and Propanolol, and the patient reports overall benefit other psychiatric hospitalization. Given that patient has a hx of increased impulsive feelings on SSRI alone, but is unable to tolerate SGA's as augmentation, will continue Propanolol to help with impulse control and will maintain low dose anti-depressant. Patient endorsed feeling that Cymbalta was helpful and she will continue. Patient did not endorse a hx of true manic episodes, only substance induced mood changes. Supportive psychotherapy was provided to the patient. The patient also participated in regular group therapy while admitted.    Labs were reviewed with the patient, and abnormal results were discussed with the patient.   The patient denied having suicidal thoughts more than 48 hours prior to discharge.  Patient denies having homicidal thoughts.  Patient denies having auditory hallucinations.  Patient denies any visual hallucinations.  Patient denies having paranoid thoughts.   The patient is able to verbalize their individual safety plan to this provider.   It is recommended to the patient to continue psychiatric medications as prescribed, after discharge from the hospital.     It is recommended to the patient to follow up with your outpatient psychiatric provider and PCP.   Discussed with the patient, the impact of  alcohol, drugs, tobacco have been there overall psychiatric and medical wellbeing, and total abstinence from substance use was recommended the patient.     Physical Findings: AIMS:  , ,  ,  ,    CIWA:    COWS:     Musculoskeletal: Strength & Muscle Tone: within normal limits Gait & Station: normal Patient leans: N/A   Psychiatric Specialty Exam:  Presentation  General Appearance:  Appropriate for Environment  Eye Contact: Good  Speech: Clear and Coherent  Speech Volume: Normal  Handedness: Right   Mood and Affect  Mood: Euthymic  Affect: Appropriate   Thought Process  Thought Processes: Coherent  Descriptions of Associations:Intact  Orientation:Full (Time, Place and Person)  Thought Content:Logical  History of Schizophrenia/Schizoaffective disorder:No  Duration of Psychotic Symptoms:N/A  Hallucinations:Hallucinations: None  Ideas of Reference:None  Suicidal Thoughts:Suicidal Thoughts: No  Homicidal Thoughts:Homicidal Thoughts: No   Sensorium  Memory: Immediate Good; Recent Good  Judgment: Fair  Insight: Fair   Executive Functions  Concentration: Good  Attention Span: Fair  Recall: Good  Fund of Knowledge: Good  Language:  Good   Psychomotor Activity  Psychomotor Activity: Psychomotor Activity: Normal Extrapyramidal Side Effects (EPS): Akathisia   Assets  Assets: Desire for Improvement; Communication Skills   Sleep  Sleep: Sleep: Good Number of Hours of Sleep: 7    Physical Exam: Physical Exam Constitutional:      Appearance: Normal appearance.  HENT:     Head: Normocephalic and atraumatic.  Pulmonary:     Effort: Pulmonary effort is normal.  Neurological:     Mental Status: She is alert and oriented to person, place, and time.    Review of Systems  Psychiatric/Behavioral:  Negative for hallucinations and suicidal ideas. The patient is not nervous/anxious.    Blood pressure 95/77, pulse 99,  temperature 98.1 F (36.7 C), temperature source Oral, resp. rate 16, height 5\' 2"  (1.575 m), weight 44.9 kg, last menstrual period 11/17/2011, SpO2 99%. Body mass index is 18.11 kg/m.   Social History   Tobacco Use  Smoking Status Every Day   Current packs/day: 1.00   Types: Cigarettes  Smokeless Tobacco Not on file   Tobacco Cessation:  A prescription for an FDA-approved tobacco cessation medication provided at discharge   Blood Alcohol level:  Lab Results  Component Value Date   [facility] <10 10/18/2022   ETH <10 09/10/2022    Metabolic Disorder Labs:  Lab Results  Component Value Date   HGBA1C 5.5 01/18/2023   MPG 111.15 01/18/2023   No results found for: "PROLACTIN" Lab Results  Component Value Date   CHOL 197 01/18/2023   TRIG 80 01/18/2023   HDL 70 01/18/2023   CHOLHDL 2.8 01/18/2023   VLDL 16 01/18/2023   LDLCALC 111 (H) 01/18/2023    See Psychiatric Specialty Exam and Suicide Risk Assessment completed by Attending Physician prior to discharge.  Discharge destination:  Home  Is patient on multiple antipsychotic therapies at discharge:  No   Has Patient had three or more failed trials of antipsychotic monotherapy by history:  No  Recommended Plan for Multiple Antipsychotic Therapies: NA  Discharge Instructions     No wound care   Complete by: As directed       Allergies as of 01/24/2023       Reactions   Nsaids Other (See Comments)   Stomach pain when taken on empty stomach   Robaxin [methocarbamol] Nausea Only   Vicodin [hydrocodone-acetaminophen] Nausea And Vomiting, Rash        Medication List     STOP taking these medications    cephALEXin 500 MG capsule Commonly known as: KEFLEX   cloNIDine 0.1 MG tablet Commonly known as: CATAPRES   ondansetron 8 MG disintegrating tablet Commonly known as: ZOFRAN-ODT   risperiDONE 1 MG tablet Commonly known as: RISPERDAL       TAKE these medications      Indication  Buprenorphine  HCl-Naloxone HCl 8-2 MG Film Place 1-2.5 Film under the tongue daily.  Indication: Opioid Dependence   DULoxetine 30 MG capsule Commonly known as: CYMBALTA Take 1 capsule (30 mg total) by mouth at bedtime.  Indication: Major Depressive Disorder   fluticasone 50 MCG/ACT nasal spray Commonly known as: FLONASE Place 1 spray into both nostrils daily.  Indication: Allergic Rhinitis   hydrOXYzine 25 MG tablet Commonly known as: ATARAX Take 1 tablet (25 mg total) by mouth 3 (three) times daily as needed for anxiety.  Indication: Feeling Anxious   ibuprofen 200 MG tablet Commonly known as: ADVIL Take 400-800 mg by mouth 2 (two) times daily as needed for  moderate pain.  Indication: Pain   loratadine 10 MG tablet Commonly known as: CLARITIN Take 1 tablet (10 mg total) by mouth daily. Start taking on: January 25, 2023  Indication: Itching Nose   nicotine 21 mg/24hr patch Commonly known as: NICODERM CQ - dosed in mg/24 hours Place 1 patch (21 mg total) onto the skin daily. Start taking on: January 25, 2023  Indication: Nicotine Addiction   nicotine polacrilex 2 MG gum Commonly known as: NICORETTE Take 1 each (2 mg total) by mouth as needed for smoking cessation.  Indication: Nicotine Addiction   omeprazole 20 MG capsule Commonly known as: PRILOSEC Take 1 capsule (20 mg total) by mouth daily.  Indication: Heartburn   propranolol 10 MG tablet Commonly known as: INDERAL Take 1 tablet (10 mg total) by mouth 2 (two) times daily.  Indication: Feeling Anxious   traZODone 50 MG tablet Commonly known as: DESYREL Take 1 tablet (50 mg total) by mouth at bedtime as needed for sleep.  Indication: Trouble Sleeping        Follow-up Information     [facility] Go to.   Specialty: Professional Counselor Why: Please go to this provider during walk in hours for new patients, 9 am to 1 pm, Monday through Friday, to receive therapy services. Contact  information: [address] [place] [address] [phone]         [facility] Follow up on 02/10/2023.   Why: You have an appointment for medication management services on 02/10/23 at 10:00 am.  This will be a Virtual telehealth appt, but you may call to switch to in person. Contact information: [address] [place] [address] [phone]                 Activity: as tolerated   Diet: heart healthy   Other: -Follow-up with your outpatient psychiatric provider -instructions on appointment date, time, and address (location) are provided to you in discharge paperwork.   -Take your psychiatric medications as prescribed at discharge - instructions are provided to you in the discharge paperwork   -Follow-up with outpatient primary care doctor and other specialists -for management of chronic medical disease, including: Opioid use d/o currently on suboxone   -Testing: Follow-up with outpatient provider for abnormal lab results: Mildly elevated LDL (111) and mild anemia Hgb 11.9   -Recommend abstinence from alcohol, tobacco, and other illicit drug use at discharge.      -If suicidal thoughts recur, call your outpatient psychiatric provider, 911, [phone] or go to the nearest emergency department.  Patient is instructed to take all prescribed medications as recommended. Report any side effects or adverse reactions to your outpatient psychiatrist. Patient is instructed to abstain from alcohol and illegal drugs while on prescription medications. In the event of worsening symptoms, patient is instructed to call the crisis hotline, 911, 988, or go to the nearest emergency department for evaluation and treatment.     Follow up recommendations: - Activity as tolerated. - Diet as recommended by PCP. - Keep all scheduled follow-up appointments as recommended.   Signed: Bobbye Morton, MD 01/24/2023, 10:33 AM

## 2023-01-25 ENCOUNTER — Other Ambulatory Visit (HOSPITAL_COMMUNITY): Payer: Self-pay

## 2023-03-09 ENCOUNTER — Other Ambulatory Visit: Payer: Self-pay

## 2023-03-09 ENCOUNTER — Encounter (HOSPITAL_COMMUNITY): Payer: Self-pay

## 2023-03-09 ENCOUNTER — Emergency Department (HOSPITAL_COMMUNITY)
Admission: EM | Admit: 2023-03-09 | Discharge: 2023-03-10 | Disposition: A | Discharge status: Home / Self Care | Payer: No Typology Code available for payment source | Attending: Emergency Medicine | Admitting: Emergency Medicine

## 2023-03-09 DIAGNOSIS — F419 Anxiety disorder, unspecified: Secondary | ICD-10-CM | POA: Insufficient documentation

## 2023-03-09 DIAGNOSIS — R443 Hallucinations, unspecified: Secondary | ICD-10-CM | POA: Diagnosis not present

## 2023-03-09 DIAGNOSIS — Z5900 Homelessness unspecified: Secondary | ICD-10-CM

## 2023-03-09 DIAGNOSIS — F329 Major depressive disorder, single episode, unspecified: Secondary | ICD-10-CM | POA: Diagnosis present

## 2023-03-09 DIAGNOSIS — H9313 Tinnitus, bilateral: Secondary | ICD-10-CM | POA: Insufficient documentation

## 2023-03-09 DIAGNOSIS — F32A Depression, unspecified: Secondary | ICD-10-CM

## 2023-03-09 DIAGNOSIS — Z79899 Other long term (current) drug therapy: Secondary | ICD-10-CM | POA: Insufficient documentation

## 2023-03-09 DIAGNOSIS — M25521 Pain in right elbow: Secondary | ICD-10-CM | POA: Insufficient documentation

## 2023-03-09 LAB — CBC WITH DIFFERENTIAL/PLATELET
Abs Immature Granulocytes: 0.03 10*3/uL (ref 0.00–0.07)
Basophils Absolute: 0.1 10*3/uL (ref 0.0–0.1)
Basophils Relative: 1 %
Eosinophils Absolute: 0.1 10*3/uL (ref 0.0–0.5)
Eosinophils Relative: 1 %
HCT: 41.1 % (ref 36.0–46.0)
Hemoglobin: 14 g/dL (ref 12.0–15.0)
Immature Granulocytes: 0 %
Lymphocytes Relative: 26 %
Lymphs Abs: 2.8 10*3/uL (ref 0.7–4.0)
MCH: 30.6 pg (ref 26.0–34.0)
MCHC: 34.1 g/dL (ref 30.0–36.0)
MCV: 89.9 fL (ref 80.0–100.0)
Monocytes Absolute: 0.6 10*3/uL (ref 0.1–1.0)
Monocytes Relative: 5 %
Neutro Abs: 7.1 10*3/uL (ref 1.7–7.7)
Neutrophils Relative %: 67 %
Platelets: 109 10*3/uL — ABNORMAL LOW (ref 150–400)
RBC: 4.57 MIL/uL (ref 3.87–5.11)
RDW: 11.8 % (ref 11.5–15.5)
WBC: 10.6 10*3/uL — ABNORMAL HIGH (ref 4.0–10.5)
nRBC: 0 % (ref 0.0–0.2)

## 2023-03-09 LAB — COMPREHENSIVE METABOLIC PANEL
ALT: 13 U/L (ref 0–44)
AST: 20 U/L (ref 15–41)
Albumin: 4.1 g/dL (ref 3.5–5.0)
Alkaline Phosphatase: 73 U/L (ref 38–126)
Anion gap: 10 (ref 5–15)
BUN: 16 mg/dL (ref 6–20)
CO2: 23 mmol/L (ref 22–32)
Calcium: 9.4 mg/dL (ref 8.9–10.3)
Chloride: 101 mmol/L (ref 98–111)
Creatinine, Ser: 0.65 mg/dL (ref 0.44–1.00)
GFR, Estimated: >60 mL/min (ref >60)
Glucose, Bld: 87 mg/dL (ref 70–99)
Potassium: 3.7 mmol/L (ref 3.5–5.1)
Sodium: 134 mmol/L — ABNORMAL LOW (ref 135–145)
Total Bilirubin: 0.7 mg/dL (ref <1.2)
Total Protein: 7.2 g/dL (ref 6.5–8.1)

## 2023-03-09 LAB — ETHANOL: Alcohol, Ethyl (B): <10 mg/dL (ref <10)

## 2023-03-09 NOTE — ED Notes (Signed)
Pt changed into hospital issued scrubs

## 2023-03-09 NOTE — ED Provider Notes (Signed)
52 yo female here with depression and SI  Pending medical clearance Here voluntarily  Physical Exam  BP 124/80   Pulse 86   Temp 97.9 F (36.6 C) (Oral)   Resp 16   Ht 5\' 2"  (1.575 m)   Wt 43.1 kg   LMP 11/17/2011   SpO2 99%   BMI 17.38 kg/m   Physical Exam  Procedures  Procedures  ED Course / MDM    Medical Decision Making Amount and/or Complexity of Data Reviewed Labs: ordered.   Patient's labs are reviewed and she is medically cleared for TTS evaluation.  She is currently not under IVC and is here voluntarily       Terald Sleeper, MD 03/10/23 0001
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
## 2023-03-09 NOTE — ED Triage Notes (Signed)
Pt BIB [facility] for psychiatric evaluation. EMS reports that pt was lying on the ground and talking to herself. Pt denies hallucinations but is clearly having a conversation with herself and states that "people are ridiculing her and threatening her". Pt denies SI/HI. She reports taht she is homeless and recently was discharged from [facility] for depression. She is voluntary and wishes to be evaluated for "depression"

## 2023-03-09 NOTE — ED Notes (Signed)
Unable to get blood x3. Will call phlebotomist.
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
## 2023-03-09 NOTE — Consult Note (Incomplete)
[facility] Consult Note  Patient Name: Kristina Hardy MRN: [id number] DOB: 1970-11-30 DATE OF Consult: 03/09/2023  PRIMARY PSYCHIATRIC DIAGNOSES  1.  *** 2.  *** 3.  ***  RECOMMENDATIONS  Recommendations: Medication recommendations: ***  Non-Medication/therapeutic recommendations: ***  Is inpatient psychiatric hospitalization recommended for this patient? {Yes/No/why:304550013}  Communication: Treatment team members (and family members if applicable) who were involved in treatment/care discussions and planning, and with whom we spoke or engaged with via secure text/chat, include the following: ***  Thank you for involving [place] in the care of this patient. If you have any additional questions or concerns, please call [phone] and ask for me or the provider on-call.  TELEPSYCHIATRY ATTESTATION & CONSENT  As the provider for this telehealth consult, I attest that I verified the patient's identity using two separate identifiers, introduced myself to the patient, provided my credentials, disclosed my location, and performed this encounter via a HIPAA-compliant, real-time, face-to-face, two-way, interactive audio and video platform and with the full consent and agreement of the patient (or guardian as applicable.)  Patient physical location: ED in [facility]. Telehealth provider physical location: home office in state of [place].  Video start time: 2300 (Central Time) Video end time: *** (Central Time)  IDENTIFYING DATA  [name] is a 52 y.o. year-old female for whom a psychiatric consultation has been ordered by the primary provider. The patient was identified using two separate identifiers.  CHIEF COMPLAINT/REASON FOR CONSULT  Depression, vague suicidal ideations    HISTORY OF PRESENT ILLNESS (HPI)  The patient is a 52yo female who presented to the emergency department, via EMS, after being found lying on the ground and talking to herself. Patient denied  hallucinations but has been observed by staff having a conversation with herself. Patient requesting to speak with psychiatry about depression. When speaking with the ED physician, patient stated she "does not want to live anymore", endorsed vague suicidal ideations.     I feel like I'm in a board game or something    PAST PSYCHIATRIC HISTORY  Per chart review, patient last admitted to inpatient psychiatry 01/16/2023- 01/24/2023 for Major Depressive Disorder. Was discharged on Cymbalta 30mg  at bedtime, Hydroxyzine 25mg  po TID PRN, Propranolol 10mg  po BID for anxiety, Trazodone 50mg  po at bedtime PRN for insomnia, and Suboxone 8-2mg , 1-2.5 film sublingual daily.     Otherwise as per HPI above.  PAST MEDICAL HISTORY  Past Medical History:  Diagnosis Date  . Anxiety   . Chronic back pain   . Depression   . Endometriosis   . Hepatitis C   . Hyperthyroidism      HOME MEDICATIONS  PTA Medications  Medication Sig  . omeprazole (PRILOSEC) 20 MG capsule Take 1 capsule (20 mg total) by mouth daily. (Patient not taking: Reported on 01/15/2023)  . fluticasone (FLONASE) 50 MCG/ACT nasal spray Place 1 spray into both nostrils daily. (Patient not taking: Reported on 01/15/2023)  . Buprenorphine HCl-Naloxone HCl 8-2 MG FILM Place 1-2.5 Film under the tongue daily.  [name] ibuprofen (ADVIL) 200 MG tablet Take 400-800 mg by mouth 2 (two) times daily as needed for moderate pain.  [name] propranolol (INDERAL) 10 MG tablet Take 1 tablet (10 mg total) by mouth 2 (two) times daily.  . DULoxetine (CYMBALTA) 30 MG capsule Take 1 capsule (30 mg total) by mouth at bedtime.  . nicotine (NICODERM CQ - DOSED IN MG/24 HOURS) 21 mg/24hr patch Place 1 patch (21 mg total) onto the skin daily.  [name]  nicotine polacrilex (NICORETTE) 2 MG gum Take 1 each (2 mg total) by mouth as needed for smoking cessation.  . traZODone (DESYREL) 50 MG tablet Take 1 tablet (50 mg total) by mouth at bedtime as needed for sleep.  [name] loratadine  (CLARITIN) 10 MG tablet Take 1 tablet (10 mg total) by mouth daily.  . hydrOXYzine (ATARAX) 25 MG tablet Take 1 tablet (25 mg total) by mouth 3 (three) times daily as needed for anxiety.     ALLERGIES  Allergies  Allergen Reactions  . Nsaids Other (See Comments)    Stomach pain when taken on empty stomach  . Robaxin [Methocarbamol] Nausea Only  . Vicodin [Hydrocodone-Acetaminophen] Nausea And Vomiting and Rash    SOCIAL & SUBSTANCE USE HISTORY  Social History   Socioeconomic History  . Marital status: Divorced    Spouse name: Not on file  . Number of children: Not on file  . Years of education: Not on file  . Highest education level: Not on file  Occupational History  . Not on file  Tobacco Use  . Smoking status: Every Day    Current packs/day: 1.00    Types: Cigarettes  . Smokeless tobacco: Not on file  Substance and Sexual Activity  . Alcohol use: No  . Drug use: No    Comment: hx of opiod abuse  . Sexual activity: Not on file  Other Topics Concern  . Not on file  Social History Narrative   ** Merged History Encounter **       Social Determinants of Health   Financial Resource Strain: Not on file  Food Insecurity: Not on file  Transportation Needs: Not on file  Physical Activity: Not on file  Stress: Not on file  Social Connections: Unknown (11/25/2021)   [facility], [facility]   Social Network   . Social Network: Not on file   Social History   Tobacco Use  Smoking Status Every Day  . Current packs/day: 1.00  . Types: Cigarettes  Smokeless Tobacco Not on file   Social History   Substance and Sexual Activity  Alcohol Use No   Social History   Substance and Sexual Activity  Drug Use No   Comment: hx of opiod abuse    Additional pertinent information ***.  FAMILY HISTORY  History reviewed. No pertinent family history. Family Psychiatric History (if known):  ***  MENTAL STATUS EXAM (MSE)  Presentation  General Appearance:   Appropriate for Environment  Eye Contact: Good  Speech: Clear and Coherent  Speech Volume: Normal  Handedness: Right   Mood and Affect  Mood: Euthymic  Affect: Appropriate   Thought Process  Thought Processes: Coherent  Descriptions of Associations: Intact  Orientation: Full (Time, Place and Person)  Thought Content: Logical  History of Schizophrenia/Schizoaffective disorder: No  Duration of Psychotic Symptoms: N/A  Hallucinations:No data recorded Ideas of Reference: None  Suicidal Thoughts:No data recorded Homicidal Thoughts:No data recorded  Sensorium  Memory: Immediate Good; Recent Good  Judgment: Fair  Insight: Fair   Art therapist  Concentration: Good  Attention Span: Fair  Recall: Good  Fund of Knowledge: Good  Language: Good   Psychomotor Activity  Psychomotor Activity:No data recorded  Assets  Assets: Desire for Improvement; Communication Skills   Sleep  Sleep:No data recorded  VITALS  Blood pressure 124/80, pulse 86, temperature 97.9 F (36.6 C), temperature source Oral, resp. rate 16, height 5\' 2"  (1.575 m), weight 43.1 kg, last menstrual period 11/17/2011, SpO2 99%.  LABS  Admission on 03/09/2023  Component Date Value Ref Range Status  . WBC 03/09/2023 10.6 (H)  4.0 - 10.5 K/uL Final  . RBC 03/09/2023 4.57  3.87 - 5.11 MIL/uL Final  . Hemoglobin 03/09/2023 14.0  12.0 - 15.0 g/dL Final  . HCT 32/44/0102 41.1  36.0 - 46.0 % Final  . MCV 03/09/2023 89.9  80.0 - 100.0 fL Final  . MCH 03/09/2023 30.6  26.0 - 34.0 pg Final  . MCHC 03/09/2023 34.1  30.0 - 36.0 g/dL Final  . RDW 72/53/6644 11.8  11.5 - 15.5 % Final  . Platelets 03/09/2023 109 (L)  150 - 400 K/uL Final   SPECIMEN CHECKED FOR CLOTS  . nRBC 03/09/2023 0.0  0.0 - 0.2 % Final  . Neutrophils Relative % 03/09/2023 67  % Final  . Neutro Abs 03/09/2023 7.1  1.7 - 7.7 K/uL Final  . Lymphocytes Relative 03/09/2023 26  % Final  . Lymphs Abs  03/09/2023 2.8  0.7 - 4.0 K/uL Final  . Monocytes Relative 03/09/2023 5  % Final  . Monocytes Absolute 03/09/2023 0.6  0.1 - 1.0 K/uL Final  . Eosinophils Relative 03/09/2023 1  % Final  . Eosinophils Absolute 03/09/2023 0.1  0.0 - 0.5 K/uL Final  . Basophils Relative 03/09/2023 1  % Final  . Basophils Absolute 03/09/2023 0.1  0.0 - 0.1 K/uL Final  . Immature Granulocytes 03/09/2023 0  % Final  . Abs Immature Granulocytes 03/09/2023 0.03  0.00 - 0.07 K/uL Final   Performed at [facility], [address], [place], [place] [address]    PSYCHIATRIC REVIEW OF SYSTEMS (ROS)  ROS: Notable for the following relevant positive findings: ROS  Additional findings:      Musculoskeletal: {Musculoskeletal neeeds/assessment:304550014}      Gait & Station: {Gait and Station:304550016}      Pain Screening: {Pain Description:304550015}      Nutrition & Dental Concerns: {Nutrition & Dental Concerns:304550017}  RISK FORMULATION/ASSESSMENT  Is the patient experiencing any suicidal or homicidal ideations: {yes/no:20286}       Explain if yes: *** Protective factors considered for safety management: ***  Risk factors/concerns considered for safety management: *** {CHL BH Risk Factors Safety Management:304550011}  Is there a safety management plan with the patient and treatment team to minimize risk factors and promote protective factors: {yes/no:20286}           Explain: *** Is crisis care placement or psychiatric hospitalization recommended: {yes/no:20286}     Based on my current evaluation and risk assessment, patient is determined at this time to be at:  {Risk level:304550009}  *RISK ASSESSMENT Risk assessment is a dynamic process; it is possible that this patient's condition, and risk level, may change. This should be re-evaluated and managed over time as appropriate. Please re-consult psychiatric consult services if additional assistance is needed in terms of risk assessment and  management. If your team decides to discharge this patient, please advise the patient how to best access emergency psychiatric services, or to call 911, if their condition worsens or they feel unsafe in any way.   Assunta Gambles, NP Telepsychiatry Consult Services

## 2023-03-09 NOTE — BH Assessment (Signed)
TTS Consult will be completed by [facility]. [facility] Coordinator will communicate assessment time and provider name in established secure chat.

## 2023-03-10 ENCOUNTER — Emergency Department (HOSPITAL_COMMUNITY)
Admission: EM | Admit: 2023-03-10 | Discharge: 2023-03-10 | Disposition: A | Discharge status: Other Acute Inpatient Hospital | Payer: No Typology Code available for payment source | Source: Home / Self Care | Attending: Emergency Medicine | Admitting: Emergency Medicine

## 2023-03-10 ENCOUNTER — Other Ambulatory Visit: Payer: Self-pay

## 2023-03-10 ENCOUNTER — Ambulatory Visit (HOSPITAL_COMMUNITY)
Admission: EM | Admit: 2023-03-10 | Discharge: 2023-03-10 | Disposition: A | Discharge status: Other Acute Inpatient Hospital | Payer: MEDICAID

## 2023-03-10 ENCOUNTER — Encounter (HOSPITAL_COMMUNITY): Payer: Self-pay

## 2023-03-10 DIAGNOSIS — F32A Depression, unspecified: Secondary | ICD-10-CM

## 2023-03-10 DIAGNOSIS — M25521 Pain in right elbow: Secondary | ICD-10-CM | POA: Insufficient documentation

## 2023-03-10 DIAGNOSIS — F419 Anxiety disorder, unspecified: Secondary | ICD-10-CM | POA: Insufficient documentation

## 2023-03-10 DIAGNOSIS — H9313 Tinnitus, bilateral: Secondary | ICD-10-CM | POA: Insufficient documentation

## 2023-03-10 DIAGNOSIS — R443 Hallucinations, unspecified: Secondary | ICD-10-CM

## 2023-03-10 DIAGNOSIS — Z5900 Homelessness unspecified: Secondary | ICD-10-CM

## 2023-03-10 DIAGNOSIS — Z79899 Other long term (current) drug therapy: Secondary | ICD-10-CM | POA: Insufficient documentation

## 2023-03-10 DIAGNOSIS — F329 Major depressive disorder, single episode, unspecified: Secondary | ICD-10-CM

## 2023-03-10 MED ORDER — NICOTINE 21 MG/24HR TD PT24: 21.0000 mg | MEDICATED_PATCH | Freq: Every day | TRANSDERMAL | Status: DC | PRN

## 2023-03-10 MED ORDER — TRAZODONE HCL 100 MG PO TABS: 50.0000 mg | ORAL_TABLET | Freq: Every evening | ORAL | Status: DC | PRN

## 2023-03-10 MED ORDER — DULOXETINE HCL 30 MG PO CPEP: 30.0000 mg | ORAL_CAPSULE | Freq: Every day | ORAL | Status: DC

## 2023-03-10 MED ORDER — LORAZEPAM 1 MG PO TABS
1.0000 mg | ORAL_TABLET | Freq: Once | ORAL | Status: AC
  Administered 2023-03-10: 1 mg via ORAL
  Filled 2023-03-10: qty 1

## 2023-03-10 MED ORDER — OMEPRAZOLE 20 MG PO TBDD: 20.0000 mg | DELAYED_RELEASE_TABLET | Freq: Every day | ORAL | Status: DC

## 2023-03-10 MED ORDER — PANTOPRAZOLE SODIUM 40 MG PO TBEC: 40.0000 mg | DELAYED_RELEASE_TABLET | Freq: Every day | ORAL | Status: DC

## 2023-03-10 MED ORDER — HYDROXYZINE HCL 25 MG PO TABS: 25.0000 mg | ORAL_TABLET | Freq: Three times a day (TID) | ORAL | Status: DC | PRN

## 2023-03-10 MED ORDER — DICLOFENAC SODIUM 1 % EX GEL
2.0000 g | Freq: Four times a day (QID) | CUTANEOUS | Status: DC
  Filled 2023-03-10: qty 100

## 2023-03-10 NOTE — ED Provider Notes (Signed)
Patient was evaluated by behavioral health team and felt to be reasonably safe and stable for outpatient follow-up.  Per my discussion with the psychiatry provider, the patient has expressed several social issues as concerns, including homelessness, cold weather outside, needing food and a place to stay tonight.  Anticipate likely discharge tonight or tomorrow AM   Terald Sleeper, MD 03/10/23 0025

## 2023-03-10 NOTE — ED Provider Notes (Signed)
[facility] EMERGENCY DEPARTMENT AT [facility] Provider Note   CSN: [id number] Arrival date & time: 03/10/23  0915     History  Chief Complaint  Patient presents with   Tinnitus    Kristina Hardy is a 52 y.o. female.  HPI Presents less than 12 hours after being discharged from facility now with worsening depression, despondency anxiety and tinnitus.  Patient also has elbow pain.  Patient's evaluation earlier today, noted.  She notes that since that discharge she has worsening anxiousness over her overall condition, she does deny suicidal ideation, denies hallucinations, has not started any of the medications recommended to her since discharge.  She also notes ongoing right elbow pain described as discomfort in the ulnar gutter, without loss of function in the wrist or forearm.  She notes that her tenderness which has been present for about a year and has been worsening as well.  No other headaches, weakness, complaints.    Home Medications Prior to Admission medications   Medication Sig Start Date End Date Taking? Authorizing Provider  Buprenorphine HCl-Naloxone HCl 8-2 MG FILM Place 1-2.5 Film under the tongue daily.    [provider]  DULoxetine (CYMBALTA) 30 MG capsule Take 1 capsule (30 mg total) by mouth at bedtime. 01/24/23   Bobbye Morton, MD  fluticasone (FLONASE) 50 MCG/ACT nasal spray Place 1 spray into both nostrils daily. Patient not taking: Reported on 01/15/2023 01/07/23   Roemhildt, Lorin T, PA-C  hydrOXYzine (ATARAX) 25 MG tablet Take 1 tablet (25 mg total) by mouth 3 (three) times daily as needed for anxiety. 01/24/23   Bobbye Morton, MD  ibuprofen (ADVIL) 200 MG tablet Take 400-800 mg by mouth 2 (two) times daily as needed for moderate pain.    [provider]  loratadine (CLARITIN) 10 MG tablet Take 1 tablet (10 mg total) by mouth daily. 01/25/23   Bobbye Morton, MD  nicotine (NICODERM CQ - DOSED IN MG/24 HOURS) 21 mg/24hr patch  Place 1 patch (21 mg total) onto the skin daily. 01/25/23   Bobbye Morton, MD  nicotine polacrilex (NICORETTE) 2 MG gum Take 1 each (2 mg total) by mouth as needed for smoking cessation. 01/24/23   Bobbye Morton, MD  omeprazole (PRILOSEC) 20 MG capsule Take 1 capsule (20 mg total) by mouth daily. Patient not taking: Reported on 01/15/2023 01/07/23   Roemhildt, Lorin T, PA-C  propranolol (INDERAL) 10 MG tablet Take 1 tablet (10 mg total) by mouth 2 (two) times daily. 01/24/23   Bobbye Morton, MD  traZODone (DESYREL) 50 MG tablet Take 1 tablet (50 mg total) by mouth at bedtime as needed for sleep. 01/24/23   Bobbye Morton, MD      Allergies    Nsaids, Robaxin [methocarbamol], and Vicodin [hydrocodone-acetaminophen]    Review of Systems   Review of Systems  Physical Exam Updated Vital Signs BP 113/80 (BP Location: Left Arm)   Pulse 97   Temp 97.6 F (36.4 C) (Oral)   Resp 16   Ht 5\' 2"  (1.575 m)   Wt 43.1 kg   LMP 11/17/2011   SpO2 100%   BMI 17.38 kg/m  Physical Exam Vitals and nursing note reviewed.  Constitutional:      General: She is not in acute distress.    Appearance: She is well-developed.  HENT:     Head: Normocephalic and atraumatic.     Right Ear: Tympanic membrane normal.     Left Ear: Tympanic  membrane normal.  Eyes:     Conjunctiva/sclera: Conjunctivae normal.  Cardiovascular:     Rate and Rhythm: Normal rate and regular rhythm.  Pulmonary:     Effort: Pulmonary effort is normal. No respiratory distress.     Breath sounds: Normal breath sounds. No stridor.  Abdominal:     General: There is no distension.  Musculoskeletal:     Comments: Right elbow range of motion unremarkable, wrist unremarkable.  There is tenderness to palpation in the right ulnar gutter, no swelling, erythema, warmth.  Skin:    General: Skin is warm and dry.  Neurological:     Mental Status: She is alert and oriented to person, place, and time.     Cranial Nerves: No cranial  nerve deficit.  Psychiatric:        Mood and Affect: Mood is anxious. Affect is labile.        Speech: Speech is rapid and pressured.        Thought Content: Thought content does not include homicidal or suicidal ideation.        Cognition and Memory: Cognition is not impaired. Memory is not impaired.     ED Results / Procedures / Treatments   Labs (all labs ordered are listed, but only abnormal results are displayed) Labs Reviewed - No data to display  EKG None  Radiology No results found.  Procedures Procedures    Medications Ordered in ED Medications  diclofenac Sodium (VOLTAREN) 1 % topical gel 2 g (has no administration in time range)  LORazepam (ATIVAN) tablet 1 mg (1 mg Oral Given 03/10/23 1610)    ED Course/ Medical Decision Making/ A&P                                 Medical Decision Making Adult female with depression, anxiety, recent change in living status now presents with worsening despondency, overwhelming anxiety.  Patient has no suicidal ideation, no hallucinations, will benefit from ongoing psychiatric evaluation and stabilization for her above concerns.  Her physical exam is otherwise reassuring, and her chronicity of tinnitus suggests appropriateness for outpatient follow-up.  Right elbow evaluated, no evidence for acute pathology some suspicion for inflammatory disorder, patient received Voltaren here.  I discussed patient's case with our [facility] team, the patient was accepted transfer to our affiliated facility.  Amount and/or Complexity of Data Reviewed External Data Reviewed: notes.    Details: Notes from within the past 24 hours reviewed, including mental health assessment, appropriateness for outpatient follow-up.  Risk Prescription drug management. Decision regarding hospitalization. Diagnosis or treatment significantly limited by social determinants of health.  Final Clinical Impression(s) / ED Diagnoses Final diagnoses:  Despondency   Anxiousness  Tinnitus of both ears    Rx / DC Orders ED Discharge Orders     None         Gerhard Munch, MD 03/10/23 1036

## 2023-03-10 NOTE — Progress Notes (Signed)
Kristina Hardy was requesting to meet with chaplain while in the emergency room lobby.  Chaplain provided listening and emotional and spiritual support as she shared about her experience of feeling "possessed by a demon."  She noticed it about a year ago, but it has become more intense the last few weeks.  She reports being able to feel when it enters her body, notices vocal changes and changes in her eyes.  She feels that it is connected with the death of her son in 2019-03-28. She stated that she is "not crazy" and wants spiritual help.  She has been trying to contact the [employer], but states that they are not helpful. She requested a warm meal stating she hadn't eaten in days and some assistance looking into some shelters. She stated that she wanted to check in to have a psych eval to prove that she is "not crazy."  Chaplain was not aware that she had just discharged from the hospital and had a BH assessment already. Chaplain provided her with a blanket and sweatshirt so that she can be warmer in the cold days ahead.   [address] [facility] Pager, [phone]

## 2023-03-10 NOTE — Progress Notes (Signed)
   03/10/23 1136  [facility] Triage Screening (Walk-ins at [facility] only)  How Did You Hear About [place]? Hospital Discharge  What Is the Reason for Your Visit/Call Today? Pt presents to [facility] voluntarily via [employer]. Pt was just seen in the ED and is recommended for outpatient services.

## 2023-03-10 NOTE — ED Notes (Addendum)
Called [facility] and they are aware of pt coming for resources Pt to be transported by safe transport

## 2023-03-10 NOTE — ED Notes (Signed)
Patient is askign for breakfast tray before discharge. RN offered sandwich and patient start yelling and screaming " I dont need that garbage sandwiches". Patient start to get more agitated but left after getting her sandwich and drinks.

## 2023-03-10 NOTE — BH Assessment (Signed)
Disposition: Assunta Gambles, NP recommends pt to be discharged and follow up with outpatient resources.

## 2023-03-10 NOTE — ED Triage Notes (Addendum)
Pt checked in last night. Wednesday night the night before Thanksgiving she was kicked out of her home. Intense ringing of the ears that has been getting worse over the last year. Says she is "being suffocated from the inside." Also c/o right elbow pain. Says her "voice is changing." Wants Psychological evaluation to rule out mental health. Says she is "seeing stuff that is there." "Something very spiritual is going on here." Feels nauseated when the negative spirits are around. Says she feels like an evil spirit is attached to her and causing bad things to happed to her. Was evaluated by [facility] yesterday.

## 2023-03-16 ENCOUNTER — Emergency Department (EMERGENCY_DEPARTMENT_HOSPITAL)
Admission: EM | Admit: 2023-03-16 | Discharge: 2023-03-17 | Disposition: A | Discharge status: Other Acute Inpatient Hospital | Payer: No Typology Code available for payment source | Source: Home / Self Care | Attending: Emergency Medicine | Admitting: Emergency Medicine

## 2023-03-16 ENCOUNTER — Encounter (HOSPITAL_COMMUNITY): Payer: Self-pay

## 2023-03-16 ENCOUNTER — Emergency Department (HOSPITAL_COMMUNITY)
Admission: EM | Admit: 2023-03-16 | Discharge: 2023-03-16 | Disposition: A | Discharge status: Home / Self Care | Payer: No Typology Code available for payment source | Attending: Emergency Medicine | Admitting: Emergency Medicine

## 2023-03-16 ENCOUNTER — Other Ambulatory Visit: Payer: Self-pay

## 2023-03-16 DIAGNOSIS — F17210 Nicotine dependence, cigarettes, uncomplicated: Secondary | ICD-10-CM | POA: Insufficient documentation

## 2023-03-16 DIAGNOSIS — F419 Anxiety disorder, unspecified: Secondary | ICD-10-CM | POA: Insufficient documentation

## 2023-03-16 DIAGNOSIS — Z5900 Homelessness unspecified: Secondary | ICD-10-CM | POA: Insufficient documentation

## 2023-03-16 DIAGNOSIS — R443 Hallucinations, unspecified: Secondary | ICD-10-CM

## 2023-03-16 DIAGNOSIS — Z79899 Other long term (current) drug therapy: Secondary | ICD-10-CM | POA: Insufficient documentation

## 2023-03-16 DIAGNOSIS — F23 Brief psychotic disorder: Secondary | ICD-10-CM

## 2023-03-16 DIAGNOSIS — F332 Major depressive disorder, recurrent severe without psychotic features: Secondary | ICD-10-CM | POA: Diagnosis present

## 2023-03-16 DIAGNOSIS — Z5329 Procedure and treatment not carried out because of patient's decision for other reasons: Secondary | ICD-10-CM | POA: Insufficient documentation

## 2023-03-16 DIAGNOSIS — R7989 Other specified abnormal findings of blood chemistry: Secondary | ICD-10-CM | POA: Diagnosis not present

## 2023-03-16 DIAGNOSIS — F418 Other specified anxiety disorders: Secondary | ICD-10-CM

## 2023-03-16 LAB — CBC WITH DIFFERENTIAL/PLATELET
Abs Immature Granulocytes: 0.02 10*3/uL (ref 0.00–0.07)
Basophils Absolute: 0.1 10*3/uL (ref 0.0–0.1)
Basophils Relative: 1 %
Eosinophils Absolute: 0.1 10*3/uL (ref 0.0–0.5)
Eosinophils Relative: 1 %
HCT: 34.3 % — ABNORMAL LOW (ref 36.0–46.0)
Hemoglobin: 11.7 g/dL — ABNORMAL LOW (ref 12.0–15.0)
Immature Granulocytes: 0 %
Lymphocytes Relative: 20 %
Lymphs Abs: 2 10*3/uL (ref 0.7–4.0)
MCH: 30.9 pg (ref 26.0–34.0)
MCHC: 34.1 g/dL (ref 30.0–36.0)
MCV: 90.5 fL (ref 80.0–100.0)
Monocytes Absolute: 0.7 10*3/uL (ref 0.1–1.0)
Monocytes Relative: 7 %
Neutro Abs: 6.8 10*3/uL (ref 1.7–7.7)
Neutrophils Relative %: 71 %
Platelets: 255 10*3/uL (ref 150–400)
RBC: 3.79 MIL/uL — ABNORMAL LOW (ref 3.87–5.11)
RDW: 11.7 % (ref 11.5–15.5)
WBC: 9.7 10*3/uL (ref 4.0–10.5)
nRBC: 0 % (ref 0.0–0.2)

## 2023-03-16 LAB — COMPREHENSIVE METABOLIC PANEL
ALT: 14 U/L (ref 0–44)
AST: 26 U/L (ref 15–41)
Albumin: 4.1 g/dL (ref 3.5–5.0)
Alkaline Phosphatase: 78 U/L (ref 38–126)
Anion gap: 10 (ref 5–15)
BUN: 19 mg/dL (ref 6–20)
CO2: 28 mmol/L (ref 22–32)
Calcium: 9.2 mg/dL (ref 8.9–10.3)
Chloride: 100 mmol/L (ref 98–111)
Creatinine, Ser: 1.44 mg/dL — ABNORMAL HIGH (ref 0.44–1.00)
GFR, Estimated: 43 mL/min — ABNORMAL LOW (ref >60)
Glucose, Bld: 100 mg/dL — ABNORMAL HIGH (ref 70–99)
Potassium: 4.1 mmol/L (ref 3.5–5.1)
Sodium: 138 mmol/L (ref 135–145)
Total Bilirubin: 0.6 mg/dL (ref <1.2)
Total Protein: 7.2 g/dL (ref 6.5–8.1)

## 2023-03-16 LAB — ETHANOL: Alcohol, Ethyl (B): <10 mg/dL (ref <10)

## 2023-03-16 MED ORDER — MIDAZOLAM HCL 2 MG/2ML IJ SOLN: 4.0000 mg | Freq: Once | INTRAMUSCULAR | Status: DC

## 2023-03-16 MED ORDER — DULOXETINE HCL 30 MG PO CPEP
30.0000 mg | ORAL_CAPSULE | Freq: Every day | ORAL | Status: DC
  Administered 2023-03-16: 30 mg via ORAL
  Filled 2023-03-16: qty 1

## 2023-03-16 MED ORDER — ZIPRASIDONE MESYLATE 20 MG IM SOLR: 20.0000 mg | INTRAMUSCULAR | Status: DC | PRN

## 2023-03-16 MED ORDER — ALUM & MAG HYDROXIDE-SIMETH 200-200-20 MG/5ML PO SUSP: 30.0000 mL | Freq: Four times a day (QID) | ORAL | Status: DC | PRN

## 2023-03-16 MED ORDER — LORAZEPAM 1 MG PO TABS: 1.0000 mg | ORAL_TABLET | ORAL | Status: DC | PRN

## 2023-03-16 MED ORDER — ONDANSETRON HCL 4 MG PO TABS: 4.0000 mg | ORAL_TABLET | Freq: Three times a day (TID) | ORAL | Status: DC | PRN

## 2023-03-16 MED ORDER — ZOLPIDEM TARTRATE 5 MG PO TABS: 5.0000 mg | ORAL_TABLET | Freq: Every evening | ORAL | Status: DC | PRN

## 2023-03-16 MED ORDER — LORAZEPAM 1 MG PO TABS
1.0000 mg | ORAL_TABLET | Freq: Once | ORAL | Status: AC
  Administered 2023-03-16: 1 mg via ORAL
  Filled 2023-03-16: qty 1

## 2023-03-16 MED ORDER — RISPERIDONE 0.5 MG PO TBDP: 2.0000 mg | ORAL_TABLET | Freq: Three times a day (TID) | ORAL | Status: DC | PRN

## 2023-03-16 MED ORDER — HALOPERIDOL LACTATE 5 MG/ML IJ SOLN: 5.0000 mg | Freq: Once | INTRAMUSCULAR | Status: DC

## 2023-03-16 MED ORDER — NICOTINE 21 MG/24HR TD PT24
21.0000 mg | MEDICATED_PATCH | Freq: Every day | TRANSDERMAL | Status: DC
  Administered 2023-03-16 – 2023-03-17 (×2): 21 mg via TRANSDERMAL
  Filled 2023-03-16 (×2): qty 1

## 2023-03-16 MED ORDER — HYDROXYZINE HCL 25 MG PO TABS: 25.0000 mg | ORAL_TABLET | Freq: Three times a day (TID) | ORAL | Status: DC | PRN

## 2023-03-16 MED ORDER — PANTOPRAZOLE SODIUM 40 MG PO TBEC
40.0000 mg | DELAYED_RELEASE_TABLET | Freq: Once | ORAL | Status: AC
  Administered 2023-03-16: 40 mg via ORAL
  Filled 2023-03-16: qty 1

## 2023-03-16 MED ORDER — BUPRENORPHINE HCL-NALOXONE HCL 2-0.5 MG SL SUBL
1.0000 | SUBLINGUAL_TABLET | Freq: Every day | SUBLINGUAL | Status: DC
  Administered 2023-03-16 – 2023-03-17 (×2): 1 via SUBLINGUAL
  Filled 2023-03-16 (×2): qty 1

## 2023-03-16 NOTE — ED Provider Notes (Signed)
[place] EMERGENCY DEPARTMENT AT [facility] Provider Note   CSN: [id number] Arrival date & time: 03/16/23  1156     History  Chief Complaint  Patient presents with   Mental Health Problem    Kristina Hardy is a 52 y.o. female with a history of hallucinations, major depressive disorder, homelessness, and anxiety presents the ED today via [employer] after walking into traffic.  Patient eloped 30 minutes prior to arrival, she was picked up by the police for standing in the middle of the busy street.  She denies SI or HI.  She endorses visual but not auditory hallucinations.  She is pacing in the hall and does not want to stay on her bed at the time of evaluation.    Home Medications Prior to Admission medications   Medication Sig Start Date End Date Taking? Authorizing Provider  Buprenorphine HCl-Naloxone HCl 8-2 MG FILM Place 1-2.5 Film under the tongue daily.    [provider]  DULoxetine (CYMBALTA) 30 MG capsule Take 1 capsule (30 mg total) by mouth at bedtime. 01/24/23   Bobbye Morton, MD  fluticasone (FLONASE) 50 MCG/ACT nasal spray Place 1 spray into both nostrils daily. Patient not taking: Reported on 01/15/2023 01/07/23   Roemhildt, Lorin T, PA-C  hydrOXYzine (ATARAX) 25 MG tablet Take 1 tablet (25 mg total) by mouth 3 (three) times daily as needed for anxiety. 01/24/23   Bobbye Morton, MD  ibuprofen (ADVIL) 200 MG tablet Take 400-800 mg by mouth 2 (two) times daily as needed for moderate pain.    [provider]  loratadine (CLARITIN) 10 MG tablet Take 1 tablet (10 mg total) by mouth daily. 01/25/23   Bobbye Morton, MD  nicotine (NICODERM CQ - DOSED IN MG/24 HOURS) 21 mg/24hr patch Place 1 patch (21 mg total) onto the skin daily. 01/25/23   Bobbye Morton, MD  nicotine polacrilex (NICORETTE) 2 MG gum Take 1 each (2 mg total) by mouth as needed for smoking cessation. 01/24/23   Bobbye Morton, MD  omeprazole (PRILOSEC) 20 MG capsule Take 1 capsule  (20 mg total) by mouth daily. Patient not taking: Reported on 01/15/2023 01/07/23   Roemhildt, Lorin T, PA-C  propranolol (INDERAL) 10 MG tablet Take 1 tablet (10 mg total) by mouth 2 (two) times daily. 01/24/23   Bobbye Morton, MD  traZODone (DESYREL) 50 MG tablet Take 1 tablet (50 mg total) by mouth at bedtime as needed for sleep. 01/24/23   Bobbye Morton, MD      Allergies    Nsaids, Robaxin [methocarbamol], and Vicodin [hydrocodone-acetaminophen]    Review of Systems   Review of Systems  Psychiatric/Behavioral:  Positive for hallucinations.   All other systems reviewed and are negative.   Physical Exam Updated Vital Signs BP 109/81 (BP Location: Right Arm)   Pulse 86   Temp 97.6 F (36.4 C) (Oral)   Resp 16   Ht 5\' 2"  (1.575 m)   Wt 43.1 kg   LMP 11/17/2011   SpO2 99%   BMI 17.38 kg/m  Physical Exam Vitals and nursing note reviewed.  Constitutional:      Appearance: Normal appearance.  HENT:     Head: Normocephalic and atraumatic.     Mouth/Throat:     Mouth: Mucous membranes are moist.  Eyes:     Conjunctiva/sclera: Conjunctivae normal.     Pupils: Pupils are equal, round, and reactive to light.  Cardiovascular:     Rate and Rhythm:  Normal rate and regular rhythm.     Pulses: Normal pulses.     Heart sounds: Normal heart sounds.  Pulmonary:     Effort: Pulmonary effort is normal.     Breath sounds: Normal breath sounds.  Abdominal:     Palpations: Abdomen is soft.     Tenderness: There is no abdominal tenderness.  Skin:    General: Skin is warm and dry.     Findings: No rash.  Neurological:     General: No focal deficit present.     Mental Status: She is alert.  Psychiatric:        Mood and Affect: Mood normal.        Behavior: Behavior normal.    ED Results / Procedures / Treatments   Labs (all labs ordered are listed, but only abnormal results are displayed) Labs Reviewed  RAPID URINE DRUG SCREEN, HOSP PERFORMED     EKG None  Radiology No results found.  Procedures Procedures: not indicated.   Medications Ordered in ED Medications  haloperidol lactate (HALDOL) injection 5 mg (has no administration in time range)  midazolam (VERSED) injection 4 mg (has no administration in time range)  buprenorphine-naloxone (SUBOXONE) 2-0.5 mg per SL tablet 1 tablet (has no administration in time range)  DULoxetine (CYMBALTA) DR capsule 30 mg (has no administration in time range)  hydrOXYzine (ATARAX) tablet 25 mg (has no administration in time range)    ED Course/ Medical Decision Making/ A&P                                 Medical Decision Making Amount and/or Complexity of Data Reviewed Labs: ordered.  Risk Prescription drug management.   This patient presents to the ED for concern of hallucinations, this involves an extensive number of treatment options, and is a complaint that carries with it a high risk of complications and morbidity.   Differential diagnosis includes: Cute psychosis, schizophrenia, bipolar disorder, substance abuse, etc.   Comorbidities  See HPI above   Additional History  Additional history obtained from prior records.   Lab Tests  I ordered and personally interpreted labs.  The pertinent results include:   UDS pending.   Test / Admission - Considered  Care transferred to Fayrene Helper, PA-C at shift change.  TTS consult pending UDS.       Final Clinical Impression(s) / ED Diagnoses Final diagnoses:  Brief psychotic disorder [facility])    Rx / DC Orders ED Discharge Orders     None         Maxwell Marion, PA-C 03/16/23 1609    Royanne Foots, DO 03/21/23 2023

## 2023-03-16 NOTE — ED Provider Notes (Signed)
Patient is medically cleared and can be further assessed and managed by psychiatry team.  BP 111/71 (BP Location: Left Arm)   Pulse 93   Temp 98.4 F (36.9 C) (Oral)   Resp 18   Ht 5\' 2"  (1.575 m)   Wt 43.1 kg   LMP 11/17/2011   SpO2 98%   BMI 17.38 kg/m   Results for orders placed or performed during the hospital encounter of 03/16/23  Comprehensive metabolic panel  Result Value Ref Range   Sodium 138 135 - 145 mmol/L   Potassium 4.1 3.5 - 5.1 mmol/L   Chloride 100 98 - 111 mmol/L   CO2 28 22 - 32 mmol/L   Glucose, Bld 100 (H) 70 - 99 mg/dL   BUN 19 6 - 20 mg/dL   Creatinine, Ser 0.98 (H) 0.44 - 1.00 mg/dL   Calcium 9.2 8.9 - 11.9 mg/dL   Total Protein 7.2 6.5 - 8.1 g/dL   Albumin 4.1 3.5 - 5.0 g/dL   AST 26 15 - 41 U/L   ALT 14 0 - 44 U/L   Alkaline Phosphatase 78 38 - 126 U/L   Total Bilirubin 0.6 <1.2 mg/dL   GFR, Estimated 43 (L) >60 mL/min   Anion gap 10 5 - 15  Ethanol  Result Value Ref Range   Alcohol, Ethyl (B) <10 <10 mg/dL  CBC with Diff  Result Value Ref Range   WBC 9.7 4.0 - 10.5 K/uL   RBC 3.79 (L) 3.87 - 5.11 MIL/uL   Hemoglobin 11.7 (L) 12.0 - 15.0 g/dL   HCT 14.7 (L) 82.9 - 56.2 %   MCV 90.5 80.0 - 100.0 fL   MCH 30.9 26.0 - 34.0 pg   MCHC 34.1 30.0 - 36.0 g/dL   RDW 13.0 86.5 - 78.4 %   Platelets 255 150 - 400 K/uL   nRBC 0.0 0.0 - 0.2 %   Neutrophils Relative % 71 %   Neutro Abs 6.8 1.7 - 7.7 K/uL   Lymphocytes Relative 20 %   Lymphs Abs 2.0 0.7 - 4.0 K/uL   Monocytes Relative 7 %   Monocytes Absolute 0.7 0.1 - 1.0 K/uL   Eosinophils Relative 1 %   Eosinophils Absolute 0.1 0.0 - 0.5 K/uL   Basophils Relative 1 %   Basophils Absolute 0.1 0.0 - 0.1 K/uL   Immature Granulocytes 0 %   Abs Immature Granulocytes 0.02 0.00 - 0.07 K/uL   No results found.    Fayrene Helper, PA-C 03/16/23 2110    Margarita Grizzle, MD 03/17/23 [phone]

## 2023-03-16 NOTE — Consult Note (Signed)
[facility] ED ASSESSMENT   Reason for Consult:  Psychiatry evaluation Referring Physician:  ER Physician Patient Identification: Kristina Hardy MRN:  [id number] ED Chief Complaint: MDD (major depressive disorder), recurrent severe, without psychosis (HCC)  Diagnosis:  Principal Problem:   MDD (major depressive disorder), recurrent severe, without psychosis (HCC)   ED Assessment Time Calculation: Start Time: 1724 Stop Time: 1750 Total Time in Minutes (Assessment Completion): 26   Subjective:   Kristina Hardy is a 52 y.o. female patient admitted with previous hx of Depression, anxiety, Substance use disorder and homelessness was brought in by [facility] after she was found walking into traffic.    Patient eloped 30 minutes after coming to the ER and was brought back to the ER. On arrival to the ER a second time patient started yelling, became very agitated and was moved to rm 29.  Finally she slept off.[name]  HPI:  Patient woke up briefly and spoke to Pharmacy technician and this provider.  Patient states she came to the ER for assistance with stress.  She rated depression and anxiety 15/10 with 10 being severe depression and anxiety.  Patient reports her stressors to be related to homelessness and no assistance from anybody.  Patient states that the cold weather, rain made her feel more depressed last night.  Patient reports poor sleep or no sleep at all.  She was in the ER last week and later transferred to [facility] to continue treatment.  This is her 17 th visits to the ER this year plus an admission to [facility] and visits to [facility].   Patient states she does not have outpatient Psychiatrist and that she only takes Suboxone but review of chart shows patient is supposed to be on Cymbalta for depression and Trazodone for sleep.  She was hospitalized in [facility] last October. UDS is still pending at this time. Caucasian female, 52 years old came in under IVC taken out by EDP after patient came in and left and was found walking  between traffics.  Patient was irritable on arrival crying and yelling stating nobody is interested in helping her.  She finally calmed down ands slept off.  She denies SI/HI/AVH and no mention of paranoia.  Patient was itching all over and scratching stating she has had no bath in days.  Patient tearfully states she needs assistance with housing.  Patient is only taking Suboxone but not his Cymbalta.  We will seek inpatient Psychiatry hospitalization for safety and stabilization.  We will fax out records to facilities seeking bed placement.  Past Psychiatric History: previous hx of Depression, anxiety, Substance use disorder and homelessness.  One [facility] hospitalization in October this year and 18 th ER visit this year plus [facility] visit.  Risk to Self or Others: Is the patient at risk to self? Yes Has the patient been a risk to self in the past 6 months? Yes Has the patient been a risk to self within the distant past? Yes Is the patient a risk to others? No Has the patient been a risk to others in the past 6 months? No Has the patient been a risk to others within the distant past? No  [place] Scale:  Flowsheet Row ED from 03/16/2023 in [facility] Emergency Department at [facility] ED from 03/10/2023 in [facility] Emergency Department at [facility] ED from 03/09/2023 in [facility] Emergency Department at [facility]  C-SSRS RISK CATEGORY No Risk No Risk No Risk  AIMS:  , , ,  ,   ASAM:    Substance Abuse:     Past Medical History:  Past Medical History:  Diagnosis Date   Anxiety    Chronic back pain    Depression    Endometriosis    Hepatitis C    Hyperthyroidism     Past Surgical History:  Procedure Laterality Date   RHINOPLASTY     TONSILLECTOMY     Family History: History reviewed. No pertinent family history. Family Psychiatric  History: Unknown by patient Social History:  Social History   Substance and Sexual Activity  Alcohol Use No      Social History   Substance and Sexual Activity  Drug Use No   Comment: hx of opiod abuse    Social History   Socioeconomic History   Marital status: Divorced    Spouse name: Not on file   Number of children: Not on file   Years of education: Not on file   Highest education level: Not on file  Occupational History   Not on file  Tobacco Use   Smoking status: Every Day    Current packs/day: 1.00    Types: Cigarettes   Smokeless tobacco: Not on file  Substance and Sexual Activity   Alcohol use: No   Drug use: No    Comment: hx of opiod abuse   Sexual activity: Not on file  Other Topics Concern   Not on file  Social History Narrative   ** Merged History Encounter **       Social Determinants of Health   Financial Resource Strain: Not on file  Food Insecurity: Not on file  Transportation Needs: Not on file  Physical Activity: Not on file  Stress: Not on file  Social Connections: Unknown (11/25/2021)   Received from [facility]   Social Network    Social Network: Not on file   Additional Social History:    Allergies:   Allergies  Allergen Reactions   Nsaids Other (See Comments)    Stomach pain when taken on empty stomach   Robaxin [Methocarbamol] Nausea Only   Vicodin [Hydrocodone-Acetaminophen] Nausea And Vomiting and Rash    Labs:  Results for orders placed or performed during the hospital encounter of 03/16/23 (from the past 48 hour(s))  Comprehensive metabolic panel     Status: Abnormal   Collection Time: 03/16/23  9:59 AM  Result Value Ref Range   Sodium 138 135 - 145 mmol/L   Potassium 4.1 3.5 - 5.1 mmol/L   Chloride 100 98 - 111 mmol/L   CO2 28 22 - 32 mmol/L   Glucose, Bld 100 (H) 70 - 99 mg/dL    Comment: Glucose reference range applies only to samples taken after fasting for at least 8 hours.   BUN 19 6 - 20 mg/dL   Creatinine, Ser 1.61 (H) 0.44 - 1.00 mg/dL   Calcium 9.2 8.9 - 09.6 mg/dL   Total Protein 7.2 6.5 - 8.1  g/dL   Albumin 4.1 3.5 - 5.0 g/dL   AST 26 15 - 41 U/L   ALT 14 0 - 44 U/L   Alkaline Phosphatase 78 38 - 126 U/L   Total Bilirubin 0.6 <1.2 mg/dL   GFR, Estimated 43 (L) >60 mL/min    Comment: (NOTE) Calculated using the CKD-EPI Creatinine Equation (2021)    Anion gap 10 5 - 15    Comment: Performed at [facility], [address]  [place], [place] [address]  Ethanol     Status: None   Collection Time: 03/16/23  9:59 AM  Result Value Ref Range   Alcohol, Ethyl (B) <10 <10 mg/dL    Comment: (NOTE) Lowest detectable limit for serum alcohol is 10 mg/dL.  For medical purposes only. Performed at [facility], [address] [place], [place] [address]   CBC with Diff     Status: Abnormal   Collection Time: 03/16/23  9:59 AM  Result Value Ref Range   WBC 9.7 4.0 - 10.5 K/uL   RBC 3.79 (L) 3.87 - 5.11 MIL/uL   Hemoglobin 11.7 (L) 12.0 - 15.0 g/dL   HCT 09.8 (L) 11.9 - 14.7 %   MCV 90.5 80.0 - 100.0 fL   MCH 30.9 26.0 - 34.0 pg   MCHC 34.1 30.0 - 36.0 g/dL   RDW 82.9 56.2 - 13.0 %   Platelets 255 150 - 400 K/uL   nRBC 0.0 0.0 - 0.2 %   Neutrophils Relative % 71 %   Neutro Abs 6.8 1.7 - 7.7 K/uL   Lymphocytes Relative 20 %   Lymphs Abs 2.0 0.7 - 4.0 K/uL   Monocytes Relative 7 %   Monocytes Absolute 0.7 0.1 - 1.0 K/uL   Eosinophils Relative 1 %   Eosinophils Absolute 0.1 0.0 - 0.5 K/uL   Basophils Relative 1 %   Basophils Absolute 0.1 0.0 - 0.1 K/uL   Immature Granulocytes 0 %   Abs Immature Granulocytes 0.02 0.00 - 0.07 K/uL    Comment: Performed at [facility], [address] [place], [place] [address]    Current Facility-Administered Medications  Medication Dose Route Frequency Provider Last Rate Last Admin   alum & mag hydroxide-simeth (MAALOX/MYLANTA) 200-200-20 MG/5ML suspension 30 mL  30 mL Oral Q6H PRN Fayrene Helper, PA-C       buprenorphine-naloxone (SUBOXONE) 2-0.5 mg per SL tablet 1 tablet  1 tablet  Sublingual Daily Fayrene Helper, PA-C   1 tablet at 03/16/23 1700   DULoxetine (CYMBALTA) DR capsule 30 mg  30 mg Oral QHS Fayrene Helper, PA-C       haloperidol lactate (HALDOL) injection 5 mg  5 mg Intramuscular Once Maxwell Marion, PA-C       hydrOXYzine (ATARAX) tablet 25 mg  25 mg Oral TID PRN Fayrene Helper, PA-C       risperiDONE (RISPERDAL M-TABS) disintegrating tablet 2 mg  2 mg Oral Q8H PRN Fayrene Helper, PA-C       And   LORazepam (ATIVAN) tablet 1 mg  1 mg Oral PRN Fayrene Helper, PA-C       And   ziprasidone (GEODON) injection 20 mg  20 mg Intramuscular PRN Fayrene Helper, PA-C       midazolam (VERSED) injection 4 mg  4 mg Intramuscular Once Maxwell Marion, PA-C       nicotine (NICODERM CQ - dosed in mg/24 hours) patch 21 mg  21 mg Transdermal Daily Fayrene Helper, PA-C   21 mg at 03/16/23 1700   ondansetron (ZOFRAN) tablet 4 mg  4 mg Oral Q8H PRN Fayrene Helper, PA-C       zolpidem (AMBIEN) tablet 5 mg  5 mg Oral QHS PRN Fayrene Helper, PA-C       Current Outpatient Medications  Medication Sig Dispense Refill   Buprenorphine HCl-Naloxone HCl 8-2 MG FILM Place 1 Film under the tongue daily.     DULoxetine (CYMBALTA) 30 MG capsule Take 1 capsule (30 mg  total) by mouth at bedtime. (Patient not taking: Reported on 03/16/2023) 30 capsule 0   fluticasone (FLONASE) 50 MCG/ACT nasal spray Place 1 spray into both nostrils daily. (Patient not taking: Reported on 01/15/2023) 16 g 2   hydrOXYzine (ATARAX) 25 MG tablet Take 1 tablet (25 mg total) by mouth 3 (three) times daily as needed for anxiety. (Patient not taking: Reported on 03/16/2023) 90 tablet 0   ibuprofen (ADVIL) 200 MG tablet Take 400-800 mg by mouth 2 (two) times daily as needed for moderate pain. (Patient not taking: Reported on 03/16/2023)     loratadine (CLARITIN) 10 MG tablet Take 1 tablet (10 mg total) by mouth daily. (Patient not taking: Reported on 03/16/2023) 30 tablet 0   nicotine (NICODERM CQ - DOSED IN MG/24 HOURS) 21 mg/24hr patch Place 1 patch  (21 mg total) onto the skin daily. (Patient not taking: Reported on 03/16/2023) 28 patch 0   nicotine polacrilex (NICORETTE) 2 MG gum Take 1 each (2 mg total) by mouth as needed for smoking cessation. (Patient not taking: Reported on 03/16/2023) 100 tablet 0   omeprazole (PRILOSEC) 20 MG capsule Take 1 capsule (20 mg total) by mouth daily. (Patient not taking: Reported on 01/15/2023) 30 capsule 0   propranolol (INDERAL) 10 MG tablet Take 1 tablet (10 mg total) by mouth 2 (two) times daily. (Patient not taking: Reported on 03/16/2023) 60 tablet 0   traZODone (DESYREL) 50 MG tablet Take 1 tablet (50 mg total) by mouth at bedtime as needed for sleep. (Patient not taking: Reported on 03/16/2023) 30 tablet 0    Musculoskeletal: Strength & Muscle Tone: within normal limits Gait & Station: normal Patient leans: Front   Psychiatric Specialty Exam: Presentation  General Appearance:  Disheveled  Eye Contact: Fleeting  Speech: Clear and Coherent; Normal Rate  Speech Volume: Normal  Handedness: Right   Mood and Affect  Mood: Depressed; Irritable  Affect: Congruent; Depressed   Thought Process  Thought Processes: Coherent  Descriptions of Associations:Intact  Orientation:Partial  Thought Content:Logical  History of Schizophrenia/Schizoaffective disorder:No  Duration of Psychotic Symptoms:N/A  Hallucinations:Hallucinations: None  Ideas of Reference:None  Suicidal Thoughts:Suicidal Thoughts: No  Homicidal Thoughts:Homicidal Thoughts: No   Sensorium  Memory: Immediate Fair; Recent Fair; Remote Fair  Judgment: Fair  Insight: Fair   Art therapist  Concentration: Fair  Attention Span: Fair  Recall: Fair  Fund of Knowledge: Good  Language: Good   Psychomotor Activity  Psychomotor Activity: Psychomotor Activity: Normal   Assets  Assets: Communication Skills; Desire for Improvement    Sleep  Sleep: Sleep: Fair   Physical  Exam: Physical Exam Vitals and nursing note reviewed.  Constitutional:      Appearance: Normal appearance.     Comments: disheveled  HENT:     Nose: Nose normal.  Cardiovascular:     Rate and Rhythm: Normal rate and regular rhythm.  Pulmonary:     Effort: Pulmonary effort is normal.  Musculoskeletal:        General: Normal range of motion.  Skin:    General: Skin is dry.  Neurological:     Mental Status: She is alert and oriented to person, place, and time.  Psychiatric:        Attention and Perception: Perception normal. She is inattentive.        Mood and Affect: Mood is anxious and depressed. Affect is labile, angry and tearful.        Speech: Speech normal.        Behavior:  Behavior is cooperative.        Thought Content: Thought content normal.        Judgment: Judgment is impulsive.    Review of Systems  Constitutional:  Positive for weight loss.  HENT: Negative.    Eyes: Negative.   Respiratory: Negative.    Cardiovascular: Negative.   Gastrointestinal: Negative.   Genitourinary: Negative.   Musculoskeletal: Negative.   Skin: Negative.   Neurological: Negative.   Endo/Heme/Allergies: Negative.   Psychiatric/Behavioral:  Positive for depression. The patient is nervous/anxious.    Blood pressure 111/71, pulse 93, temperature 98.4 F (36.9 C), temperature source Oral, resp. rate 18, height 5\' 2"  (1.575 m), weight 43.1 kg, last menstrual period 11/17/2011, SpO2 98%. Body mass index is 17.38 kg/m.  Medical Decision Making: Patient denies suicide ideation at this time but was found earlier walking in and out of traffic.  She reports feeling depressed, hopeless and helpless due to homelessness.  Patient is a danger to herself and we will seek inpatient Psychiatry hospitalization.  WE will resume Cymbalta 30 mg po daily for Depression, Hydroxyzine 25 mg po tid for anxiety, Ambien 5 mg po at bed time as needed for sleep. Disposition:  Admit, seek bed  placement.  Earney Navy, NP-PMHNP-BC 03/16/2023 6:07 PM

## 2023-03-16 NOTE — ED Notes (Signed)
Pt in room 29 belongings are in lockers number 29 and 38.
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
## 2023-03-16 NOTE — ED Notes (Signed)
Currently no beds available at [facility] pt is being faxed out to other facilities. Ms Kristina Hardy remains asleep at this time sitter at bedside respirations are easy skin color WNL for ethnicity.

## 2023-03-16 NOTE — ED Notes (Signed)
Pt looking under bed, speaking to people who are not there

## 2023-03-16 NOTE — ED Notes (Addendum)
Patient is walking put of ER, primary RN, tech, and this RN explained to patient she has to stay out of room patient continues to walk out we follow her to lobby and tell her if she walks outside of the ER she will be an AMA and patient states that's fine. Patient left he belongings. Belongings give to [employer]

## 2023-03-16 NOTE — ED Triage Notes (Signed)
Patient walked out less than 30 min ago. EMS and [employer] found patient walking into traffic. Hx of schizophrenia.

## 2023-03-16 NOTE — Progress Notes (Signed)
CSW received a phone call that pt is under review at [facility] per [place], Intake. This CSW informed pt's assigned nurse that [facility] would be calling for nursing intake questions.   Maryjean Ka, MSW, LCSWA 03/17/2023 12:03 AM

## 2023-03-16 NOTE — Progress Notes (Signed)
LCSW Progress Note  [id number]   Kristina Hardy  03/16/2023  11:26 PM    Inpatient Behavioral Health Placement  Pt meets inpatient criteria per Earney Navy, NP-PMHNP-BC. There are no available beds within [facility] per [facility] Kim Brooks,RN. Referral was sent to the following facilities;   Destination  Service Provider Address Phone [facility]  [address] [place], [place] [address] [phone]  [facility]  [address] [place] [address] [phone]  [facility]  [address] [place] [address] [phone]  [facility]  [address], [place] [address] [phone]  [facility]  [address], [place] [address] [phone]  [facility]  [address], [place] [address] [phone]  [facility]  [place] [phone]  [facility]  [address] [place] [address] [phone]  [facility] EFAX  [address] [name] [phone]  [facility]  [address], [place] [address] [phone]  [facility]  [address] [place] [address] [phone]  [facility]  [address], [place] [address] [phone]  [facility]  [address] [place] [address] [phone]  [facility]  [address], [place] [address] [phone]  [facility]  [address] [place] [address] [phone]  [facility]  [address], [place] [address]  [phone]  [facility]  [place] [phone]  [facility]  [address], [place] [address] [phone]  [facility] Health Patient Placement  [facility], [place] [phone]  [facility]  [address] [place] [address] [phone]  [facility] [address]  [address] [place] [address] [phone]  [facility]  [address] [place], [place] [address] [phone]  [facility]  [address] [place] [address] [phone]  [facility]  [address] [place], [place] [address] [phone]  [facility]  [address] [place] [address] [phone]  [facility]  [address], [place] [address] [phone]  [facility]  [address] [place] [address] [phone]  [facility]  [address] [place] [address] [phone]    Situation ongoing,  CSW will follow up.    Maryjean Ka, MSW, [facility] 03/16/2023 11:26 PM
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
## 2023-03-16 NOTE — ED Notes (Signed)
Patient attempting to walk out again. [employer] speaking to patient.

## 2023-03-17 LAB — RAPID URINE DRUG SCREEN, HOSP PERFORMED
Amphetamines: POSITIVE — AB
Barbiturates: NOT DETECTED
Benzodiazepines: POSITIVE — AB
Cocaine: NOT DETECTED
Opiates: NOT DETECTED
Tetrahydrocannabinol: NOT DETECTED

## 2023-03-17 NOTE — ED Notes (Signed)
Report given vivian at [facility].

## 2023-03-17 NOTE — ED Notes (Signed)
Kristina Hardy has been accepted for admission at [facility]. Accepting doctor is Dr Darra Lis and report can be called to [phone] pt can arrive any time after 0900.

## 2023-03-17 NOTE — ED Notes (Signed)
Message left with sheriff transportation  answering service

## 2023-03-17 NOTE — Progress Notes (Signed)
Pt has been accepted to [facility] Bed assignment:   Pt meets inpatient criteria per: Dahlia Byes NP  Attending Physician will be: Dr.K Betti Cruz MD  Report can be called to: [phone]  Pt can arrive after 9:00 am    Care Team Notified: Alfonzo Feller RN, Staci Acosta LCSW-A   [place] Bria Portales LCSW-A   03/17/2023 8:21 AM

## 2023-03-17 NOTE — ED Notes (Signed)
Called for sheriff transport, HIPAA compliant voicemail left to return phone call to this Clinical research associate

## 2023-03-17 NOTE — ED Provider Notes (Signed)
No complaints on rounds today. VSS. Accepted to [facility]; transferring today.    Coral Spikes, DO 03/17/23 1239

## 2023-04-04 ENCOUNTER — Encounter (HOSPITAL_COMMUNITY): Payer: Self-pay | Admitting: Emergency Medicine

## 2023-04-04 ENCOUNTER — Other Ambulatory Visit: Payer: Self-pay

## 2023-04-04 ENCOUNTER — Emergency Department (HOSPITAL_COMMUNITY)
Admission: EM | Admit: 2023-04-04 | Discharge: 2023-04-04 | Discharge status: Against Medical Advice | Payer: No Typology Code available for payment source | Attending: Emergency Medicine | Admitting: Emergency Medicine

## 2023-04-04 DIAGNOSIS — F419 Anxiety disorder, unspecified: Secondary | ICD-10-CM | POA: Insufficient documentation

## 2023-04-04 DIAGNOSIS — Z5321 Procedure and treatment not carried out due to patient leaving prior to being seen by health care provider: Secondary | ICD-10-CM | POA: Insufficient documentation
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
## 2023-04-04 NOTE — Progress Notes (Signed)
Chaplain meets pt in hallway and pt asks to visit. She discusses substance use disorder and mental health diagnoses as well as her experiences of demons and Satan. Chaplain provides reflective listening and support until pt is called to triage.
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
## 2023-04-04 NOTE — ED Notes (Signed)
Patient no longer visualized in the ED, patient did not tell RN of intention to leave.

## 2023-04-11 ENCOUNTER — Emergency Department (HOSPITAL_COMMUNITY): Payer: No Typology Code available for payment source

## 2023-04-11 ENCOUNTER — Emergency Department (HOSPITAL_COMMUNITY)
Admission: EM | Admit: 2023-04-11 | Discharge: 2023-04-12 | Disposition: A | Discharge status: Home / Self Care | Payer: No Typology Code available for payment source | Attending: Emergency Medicine | Admitting: Emergency Medicine

## 2023-04-11 DIAGNOSIS — B349 Viral infection, unspecified: Secondary | ICD-10-CM | POA: Diagnosis not present

## 2023-04-11 DIAGNOSIS — R519 Headache, unspecified: Secondary | ICD-10-CM | POA: Diagnosis present

## 2023-04-11 DIAGNOSIS — Z20822 Contact with and (suspected) exposure to covid-19: Secondary | ICD-10-CM | POA: Diagnosis not present

## 2023-04-11 DIAGNOSIS — R109 Unspecified abdominal pain: Secondary | ICD-10-CM | POA: Insufficient documentation

## 2023-04-11 DIAGNOSIS — R112 Nausea with vomiting, unspecified: Secondary | ICD-10-CM

## 2023-04-11 LAB — COMPREHENSIVE METABOLIC PANEL WITH GFR
ALT: 13 U/L (ref 0–44)
AST: 20 U/L (ref 15–41)
Albumin: 3.7 g/dL (ref 3.5–5.0)
Alkaline Phosphatase: 68 U/L (ref 38–126)
Anion gap: 12 (ref 5–15)
BUN: 14 mg/dL (ref 6–20)
CO2: 23 mmol/L (ref 22–32)
Calcium: 9.1 mg/dL (ref 8.9–10.3)
Chloride: 100 mmol/L (ref 98–111)
Creatinine, Ser: 0.59 mg/dL (ref 0.44–1.00)
GFR, Estimated: >60 mL/min
Glucose, Bld: 105 mg/dL — ABNORMAL HIGH (ref 70–99)
Potassium: 3.7 mmol/L (ref 3.5–5.1)
Sodium: 135 mmol/L (ref 135–145)
Total Bilirubin: 0.9 mg/dL (ref 0.0–1.2)
Total Protein: 7 g/dL (ref 6.5–8.1)

## 2023-04-11 LAB — CBC
HCT: 42.2 % (ref 36.0–46.0)
Hemoglobin: 14.2 g/dL (ref 12.0–15.0)
MCH: 29.8 pg (ref 26.0–34.0)
MCHC: 33.6 g/dL (ref 30.0–36.0)
MCV: 88.7 fL (ref 80.0–100.0)
Platelets: 233 10*3/uL (ref 150–400)
RBC: 4.76 MIL/uL (ref 3.87–5.11)
RDW: 11.5 % (ref 11.5–15.5)
WBC: 11.2 10*3/uL — ABNORMAL HIGH (ref 4.0–10.5)
nRBC: 0 % (ref 0.0–0.2)

## 2023-04-11 LAB — URINALYSIS, ROUTINE W REFLEX MICROSCOPIC
Bacteria, UA: NONE SEEN
Bilirubin Urine: NEGATIVE
Glucose, UA: NEGATIVE mg/dL
Hgb urine dipstick: NEGATIVE
Ketones, ur: 20 mg/dL — AB
Nitrite: NEGATIVE
Protein, ur: NEGATIVE mg/dL
Specific Gravity, Urine: 1.023 (ref 1.005–1.030)
pH: 5 (ref 5.0–8.0)

## 2023-04-11 LAB — RESP PANEL BY RT-PCR (RSV, FLU A&B, COVID)  RVPGX2
Influenza A by PCR: NEGATIVE
Influenza B by PCR: NEGATIVE
Resp Syncytial Virus by PCR: NEGATIVE
SARS Coronavirus 2 by RT PCR: NEGATIVE

## 2023-04-11 LAB — HCG, QUANTITATIVE, PREGNANCY: hCG, Beta Chain, Quant, S: 2 m[IU]/mL (ref <5)

## 2023-04-11 LAB — LIPASE, BLOOD: Lipase: 24 U/L (ref 11–51)

## 2023-04-11 MED ORDER — ACETAMINOPHEN 500 MG PO TABS
1000.0000 mg | ORAL_TABLET | Freq: Once | ORAL | Status: AC
  Administered 2023-04-11: 1000 mg via ORAL
  Filled 2023-04-11: qty 2

## 2023-04-11 MED ORDER — PROCHLORPERAZINE MALEATE 5 MG PO TABS
5.0000 mg | ORAL_TABLET | Freq: Once | ORAL | Status: AC
  Administered 2023-04-11: 5 mg via ORAL
  Filled 2023-04-11: qty 1

## 2023-04-11 MED ORDER — KETOROLAC TROMETHAMINE 15 MG/ML IJ SOLN
15.0000 mg | Freq: Once | INTRAMUSCULAR | Status: AC
  Administered 2023-04-11: 15 mg via INTRAVENOUS
  Filled 2023-04-11: qty 1

## 2023-04-11 MED ORDER — IOHEXOL 350 MG/ML SOLN
75.0000 mL | Freq: Once | INTRAVENOUS | Status: AC | PRN
  Administered 2023-04-11: 75 mL via INTRAVENOUS

## 2023-04-11 MED ORDER — PROCHLORPERAZINE EDISYLATE 10 MG/2ML IJ SOLN
5.0000 mg | INTRAMUSCULAR | Status: AC
  Administered 2023-04-11: 5 mg via INTRAVENOUS
  Filled 2023-04-11 (×2): qty 2

## 2023-04-11 MED ORDER — DIPHENHYDRAMINE HCL 50 MG/ML IJ SOLN
25.0000 mg | Freq: Once | INTRAMUSCULAR | Status: AC
  Administered 2023-04-11: 25 mg via INTRAVENOUS
  Filled 2023-04-11: qty 1

## 2023-04-11 MED ORDER — DEXAMETHASONE SODIUM PHOSPHATE 10 MG/ML IJ SOLN
10.0000 mg | Freq: Once | INTRAMUSCULAR | Status: AC
  Administered 2023-04-11: 10 mg via INTRAVENOUS
  Filled 2023-04-11: qty 1

## 2023-04-11 NOTE — ED Provider Triage Note (Signed)
 Emergency Medicine Provider Triage Evaluation Note  Kristina Hardy , a 53 y.o. female  was evaluated in triage.  Pt complains of N/V, abd pain.  Review of Systems  Positive: Acid reflux, N/V, HA, urine odor, dizziness Negative: SOB, CP, Diarrhea, dysuria, cough, congestion  Physical Exam  BP 111/81 (BP Location: Right Arm)   Pulse 85   Temp 98.5 F (36.9 C)   Resp 18   LMP 11/17/2011   SpO2 95%  Gen:   Awake, leaned over in wheelchair Resp:  Normal effort  MSK:   Moves extremities without difficulty  Other:    Medical Decision Making  Medically screening exam initiated at 12:21 PM.  Appropriate orders placed.  Kristina Hardy was informed that the remainder of the evaluation will be completed by another provider, this initial triage assessment does not replace that evaluation, and the importance of remaining in the ED until their evaluation is complete.  Imagining ordered   Francis Ileana SAILOR, PA-C 04/11/23 1225

## 2023-04-11 NOTE — ED Triage Notes (Signed)
 Pt arrives via [facility] from [facility] for abd pain and N/V that started this morning. Pt also c/o upper back pain. Pt reports taking zofran at approximately 0800 this morning.

## 2023-04-12 DIAGNOSIS — B349 Viral infection, unspecified: Secondary | ICD-10-CM | POA: Diagnosis not present

## 2023-04-12 LAB — RAPID URINE DRUG SCREEN, HOSP PERFORMED
Amphetamines: POSITIVE — AB
Barbiturates: NOT DETECTED
Benzodiazepines: POSITIVE — AB
Cocaine: NOT DETECTED
Opiates: NOT DETECTED
Tetrahydrocannabinol: NOT DETECTED

## 2023-04-12 MED ORDER — LACTATED RINGERS IV BOLUS
1000.0000 mL | Freq: Once | INTRAVENOUS | Status: AC
  Administered 2023-04-12: 1000 mL via INTRAVENOUS

## 2023-04-12 MED ORDER — ONDANSETRON HCL 4 MG PO TABS: 4.0000 mg | ORAL_TABLET | Freq: Four times a day (QID) | ORAL | 0 refills | Status: DC

## 2023-04-12 NOTE — Discharge Instructions (Addendum)
 You were seen in the ER today for evaluation of your headache and abdominal pain.  Your workup was unremarkable.  I believe you have a viral illness.  I am prescribing some Zofran  to take as needed for nausea. Please make sure that you are staying well hydrated. I also recommend taking some over-the-counter cough and cold medications. I am glad that you are feeling better.  If you develop a worsening headache, neck stiffness, worsening abdominal pain, persistently vomiting, please return to the ER. If you start to have any new or worsening symptoms, please return to your nearest emergency department for evaluation.  Contact a doctor if: Medicine does not help your symptoms. You have a headache that feels different than the other headaches. You feel like you may vomit (nauseous) or you vomit. You have a fever. Get help right away if: Your headache: Gets very bad quickly. Gets worse after a lot of physical activity. You have any of these symptoms: You continue to vomit. A stiff neck. Trouble seeing. Your eye or ear hurts. Trouble speaking. Weak muscles or you lose muscle control. You lose your balance or have trouble walking. You feel like you will pass out (faint) or you pass out. You are mixed up (confused). You have a seizure. These symptoms may be an emergency. Get help right away. Call your local emergency services (911 in the [place] Do not wait to see if the symptoms will go away. Do not drive yourself to the hospital.

## 2023-04-12 NOTE — ED Notes (Signed)
 Pt provided bus pass. Ambulatory to the lobby.

## 2023-04-12 NOTE — ED Provider Notes (Signed)
  Physical Exam  BP 92/71 (BP Location: Left Arm)   Pulse 74   Temp 97.7 F (36.5 C) (Oral)   Resp 11   LMP 11/17/2011   SpO2 98%   Physical Exam  Procedures  Procedures  ED Course / MDM    Medical Decision Making Amount and/or Complexity of Data Reviewed Labs: ordered. Radiology: ordered.  Risk OTC drugs. Prescription drug management.   Patient care assumed at shift handoff.  See previous providers note for full details.  In short, patient presented to the emergency department from [facility] complaining of abdominal pain with nausea and vomiting.  She states that a virus is going around and a lot of her Co-residents have been having similar symptoms.  Workup overall was grossly benign prior to my assumption of care.  Patient pending discharge after liter of fluid and blood pressure reassessment.  Patient's blood pressures borderline soft in the 80 systolic range.  Patient treated with a total of 2 L of lactated Ringer 's.  After awakening blood pressures start to improve into the mid 90s.  She was negative for orthostatic changes.  She is urinating without difficulty.  She does not appear to be hypervolemic at this time.  There is no indication for further emergent workup.  Plan to discharge home.       Kristina Hardy Kristina Hardy 04/12/23 9485    Lorette Mayo, MD 04/12/23 [phone]

## 2023-04-12 NOTE — ED Notes (Signed)
 Micro lab called to add on urine culture to existing specimen.

## 2023-04-12 NOTE — ED Provider Notes (Signed)
 [place] EMERGENCY DEPARTMENT AT [facility] Provider Note   CSN: [id number] Arrival date & time: 04/11/23  1022     History Chief Complaint  Patient presents with   Abdominal Pain   Emesis    Kristina Hardy is a 53 y.o. female with history of IV drug use in remission for 16 months, MDD, currently spearing seeing homelessness however staying at [employer] presents emerged part today for evaluation of runny nose, nasal congestion, frontal headache, nonseptic abdominal pain with 1 episode of emesis.  Reports some nausea.  Denies any diarrhea or constipation.  She denies any urinary or vaginal symptoms.  She denies any neck pain or neck stiffness.  Reports some occasional photophobia but denies any vision loss.  She denies any sore throat.  Reports some chills but no recorded fevers.  Denies any cough.  She reports that the symptom started whenever she woke up this morning.  She has not trialed any medications for symptoms.  When asking where her belly pain is, she reports I do not know it just hurts.  She did take a Zofran  prior to arrival.  She reports that there is a virus going around her room and she is right now and a lot of her co-residents have been having similar symptoms.  Her chart mentions that she is allergic to NSAIDs however she reports that she is not.  She reports she is okay with me giving her Toradol .  She denies any IV drug use reports she is been clean for 16 months however did snort meth 2 days ago.  She does smoke a pack per day.  Denies any alcohol use.   Abdominal Pain Associated symptoms: chills, nausea and vomiting   Associated symptoms: no chest pain, no constipation, no cough, no diarrhea, no dysuria, no fever, no hematuria, no shortness of breath, no sore throat and no vaginal bleeding   Emesis Associated symptoms: abdominal pain, chills and headaches   Associated symptoms: no cough, no diarrhea, no fever and no sore throat        Home  Medications Prior to Admission medications   Medication Sig Start Date End Date Taking? Authorizing Provider  Buprenorphine  HCl-Naloxone  HCl 8-2 MG FILM Place 1 Film under the tongue daily.    [provider]  DULoxetine  (CYMBALTA ) 30 MG capsule Take 1 capsule (30 mg total) by mouth at bedtime. Patient not taking: Reported on 03/16/2023 01/24/23   McQuilla, Jai B, MD  fluticasone  (FLONASE ) 50 MCG/ACT nasal spray Place 1 spray into both nostrils daily. Patient not taking: Reported on 01/15/2023 01/07/23   Roemhildt, Lorin T, PA-C  hydrOXYzine  (ATARAX ) 25 MG tablet Take 1 tablet (25 mg total) by mouth 3 (three) times daily as needed for anxiety. Patient not taking: Reported on 03/16/2023 01/24/23   McQuilla, Jai B, MD  ibuprofen  (ADVIL ) 200 MG tablet Take 400-800 mg by mouth 2 (two) times daily as needed for moderate pain. Patient not taking: Reported on 03/16/2023    [provider]  loratadine  (CLARITIN ) 10 MG tablet Take 1 tablet (10 mg total) by mouth daily. Patient not taking: Reported on 03/16/2023 01/25/23   McQuilla, Jai B, MD  nicotine  (NICODERM CQ  - DOSED IN MG/24 HOURS) 21 mg/24hr patch Place 1 patch (21 mg total) onto the skin daily. Patient not taking: Reported on 03/16/2023 01/25/23   McQuilla, Jai B, MD  nicotine  polacrilex (NICORETTE ) 2 MG gum Take 1 each (2 mg total) by mouth as needed for smoking cessation.  Patient not taking: Reported on 03/16/2023 01/24/23   McQuilla, Jai B, MD  omeprazole  (PRILOSEC) 20 MG capsule Take 1 capsule (20 mg total) by mouth daily. Patient not taking: Reported on 01/15/2023 01/07/23   Roemhildt, Lorin T, PA-C  propranolol  (INDERAL ) 10 MG tablet Take 1 tablet (10 mg total) by mouth 2 (two) times daily. Patient not taking: Reported on 03/16/2023 01/24/23   McQuilla, Jai B, MD  traZODone  (DESYREL ) 50 MG tablet Take 1 tablet (50 mg total) by mouth at bedtime as needed for sleep. Patient not taking: Reported on 03/16/2023 01/24/23    Juleen Reggie NOVAK, MD      Allergies    Nsaids, Robaxin  [methocarbamol ], and Vicodin [hydrocodone-acetaminophen ]    Review of Systems   Review of Systems  Constitutional:  Positive for chills. Negative for fever.  HENT:  Positive for congestion and rhinorrhea. Negative for sore throat.   Respiratory:  Negative for cough and shortness of breath.   Cardiovascular:  Negative for chest pain.  Gastrointestinal:  Positive for abdominal pain, nausea and vomiting. Negative for constipation and diarrhea.  Genitourinary:  Negative for dysuria, hematuria and vaginal bleeding.  Musculoskeletal:  Negative for neck pain and neck stiffness.  Neurological:  Positive for headaches.    Physical Exam Updated Vital Signs BP 90/67 (BP Location: Right Arm)   Pulse 81   Temp 98.1 F (36.7 C) (Oral)   Resp 16   LMP 11/17/2011   SpO2 95%  Physical Exam Vitals and nursing note reviewed.  Constitutional:      Appearance: She is not toxic-appearing.     Comments: Uncomfortable, but nontoxic-appearing.  Tearful.  HENT:     Mouth/Throat:     Mouth: Mucous membranes are moist.  Eyes:     General: No scleral icterus.    Extraocular Movements: Extraocular movements intact.     Pupils: Pupils are equal, round, and reactive to light.  Neck:     Comments: No nuchal rigidity.  Patient has full range of motion without pain. Cardiovascular:     Rate and Rhythm: Normal rate.  Pulmonary:     Effort: Pulmonary effort is normal. No respiratory distress.  Abdominal:     Palpations: Abdomen is soft.     Tenderness: There is no abdominal tenderness. There is no guarding or rebound.     Comments: Abdomen is soft and nontender.  No overlying skin changes noted.  Lymphadenopathy:     Cervical: No cervical adenopathy.  Skin:    General: Skin is warm and dry.  Neurological:     General: No focal deficit present.     Mental Status: She is alert and oriented to person, place, and time.     Cranial Nerves: No  dysarthria or facial asymmetry.     Gait: Gait is intact. Gait normal.     ED Results / Procedures / Treatments   Labs (all labs ordered are listed, but only abnormal results are displayed) Labs Reviewed  COMPREHENSIVE METABOLIC PANEL - Abnormal; Notable for the following components:      Result Value   Glucose, Bld 105 (*)    All other components within normal limits  CBC - Abnormal; Notable for the following components:   WBC 11.2 (*)    All other components within normal limits  URINALYSIS, ROUTINE W REFLEX MICROSCOPIC - Abnormal; Notable for the following components:   APPearance HAZY (*)    Ketones, ur 20 (*)    Leukocytes,Ua MODERATE (*)    All  other components within normal limits  RESP PANEL BY RT-PCR (RSV, FLU A&B, COVID)  RVPGX2  LIPASE, BLOOD  HCG, QUANTITATIVE, PREGNANCY    EKG None  Radiology CT ABDOMEN PELVIS W CONTRAST Result Date: 04/11/2023 CLINICAL DATA:  Acute abdominal pain EXAM: CT ABDOMEN AND PELVIS WITH CONTRAST TECHNIQUE: Multidetector CT imaging of the abdomen and pelvis was performed using the standard protocol following bolus administration of intravenous contrast. RADIATION DOSE REDUCTION: This exam was performed according to the departmental dose-optimization program which includes automated exposure control, adjustment of the mA and/or kV according to patient size and/or use of iterative reconstruction technique. CONTRAST:  75mL OMNIPAQUE  IOHEXOL  350 MG/ML SOLN COMPARISON:  CT abdomen and pelvis 12/11/2022 FINDINGS: Lower chest: There is atelectasis in the lung bases. Hepatobiliary: No focal liver abnormality is seen. No gallstones, gallbladder wall thickening, or biliary dilatation. Pancreas: Unremarkable. No pancreatic ductal dilatation or surrounding inflammatory changes. Spleen: Normal in size without focal abnormality. Adrenals/Urinary Tract: Subcentimeter hypodensity in the right kidney is too small to characterize, likely cysts. Otherwise, the  kidneys, adrenal glands, and bladder are within normal limits. Stomach/Bowel: Stomach is within normal limits. Appendix is not seen. No evidence of bowel wall thickening, distention, or inflammatory changes. Vascular/Lymphatic: No significant vascular findings are present. No enlarged abdominal or pelvic lymph nodes. Reproductive: Uterus and bilateral adnexa are unremarkable. Other: No abdominal wall hernia or abnormality. No abdominopelvic ascites. Musculoskeletal: There are degenerative changes at L4-L5 and L5-S1. IMPRESSION: 1. No acute localizing process in the abdomen or pelvis. 2. Subcentimeter hypodensity in the right kidney is too small to characterize, likely cysts. No follow-up imaging recommended. Electronically Signed   By: Greig Pique M.D.   On: 04/11/2023 19:47   CT Head Wo Contrast Result Date: 04/11/2023 CLINICAL DATA:  Headache, new onset (Age >= 51y) EXAM: CT HEAD WITHOUT CONTRAST TECHNIQUE: Contiguous axial images were obtained from the base of the skull through the vertex without intravenous contrast. RADIATION DOSE REDUCTION: This exam was performed according to the departmental dose-optimization program which includes automated exposure control, adjustment of the mA and/or kV according to patient size and/or use of iterative reconstruction technique. COMPARISON:  CT head February 22, 2006. FINDINGS: Brain: No evidence of acute infarction, hemorrhage, hydrocephalus, extra-axial collection or mass lesion/mass effect. Vascular: No hyperdense vessel or unexpected calcification. Skull: No acute fracture. Sinuses/Orbits: Clear sinuses.  No acute orbital findings. Other: No mastoid effusions. IMPRESSION: No evidence of acute intracranial abnormality. Electronically Signed   By: Gilmore GORMAN Molt M.D.   On: 04/11/2023 19:45    Procedures Procedures    Medications Ordered in ED Medications  prochlorperazine  (COMPAZINE ) tablet 5 mg (5 mg Oral Given 04/11/23 1233)  prochlorperazine  (COMPAZINE )  injection 5 mg (5 mg Intravenous Given 04/11/23 1825)  diphenhydrAMINE  (BENADRYL ) injection 25 mg (25 mg Intravenous Given 04/11/23 1821)  ketorolac  (TORADOL ) 15 MG/ML injection 15 mg (15 mg Intravenous Given 04/11/23 1823)  dexamethasone  (DECADRON ) injection 10 mg (10 mg Intravenous Given 04/11/23 1823)  acetaminophen  (TYLENOL ) tablet 1,000 mg (1,000 mg Oral Given 04/11/23 1852)  iohexol  (OMNIPAQUE ) 350 MG/ML injection 75 mL (75 mLs Intravenous Contrast Given 04/11/23 1937)    ED Course/ Medical Decision Making/ A&P    Medical Decision Making Amount and/or Complexity of Data Reviewed Labs: ordered. Radiology: ordered.  Risk OTC drugs. Prescription drug management.   53 y.o. female presents to the ER for evaluation of runny nose, nasal ingestion, nonspecific abdominal pain and frontal headache. Differential diagnosis includes but is not limited to viral  illness, congestion, meningitis, intra-abdominal ideology. Vital signs unremarkable. Physical exam as noted above.   I have ordered the patient IV migraine cocktail.  She has been sleeping in no acute distress.  I independently reviewed and interpreted the patient's labs.  CMP shows glucose 105 otherwise no electrolyte or LFT abnormalities.  Lipase abnormal limits.  CBC shows slight leukocytosis 11.2.  No anemia.  Respiratory panel is negative.  hCG 2, likely slightly elevated due to patient's postmenopausal status.  Urinalysis shows hazy urine with moderate leukocytes.  There is 1120 red blood cells and white blood cells present however there are 6-10 squamous epithelials.  Urine culture added.  CT head No evidence of acute intracranial abnormality. Per radiologist's interpretation.  Per radiologist's interpretation.    CT Abd  1. No acute localizing process in the abdomen or pelvis. 2. Subcentimeter hypodensity in the right kidney is too small to characterize, likely cysts. No follow-up imaging recommended. Per radiologist's interpretation.    On  multiple re-evaluations, the patient reports that she was feeling better and no longer has a headache. Has been resting in no acute distress.  She has eaten and drink and without vomiting.  I discussed with her that she likely has a viral illness, likely at the when she was describing to me that was going through the shelter.  Recommended that she try over-the-counter cough and cold medication to help with her runny nose, nasal congestion, and bodyaches.  Will prescribe her a few Zofran  to take as needed for nausea.  Her blood pressures have been a little soft while here.  She has been sleeping.  I will handoff to oncoming shift to follow-up with blood pressure after getting some fluids.  It appears that her blood pressures usually are in the 90s systolically.  1:06 AM Care of Kristina Hardy  transferred to PA Ubaldo High at the end of my shift as the patient will require reassessment once labs/imaging have resulted. Patient presentation, ED course, and plan of care discussed with review of all pertinent labs and imaging. Please see his/her note for further details regarding further ED course and disposition. Plan at time of handoff is follow up on BP after fluids and reassess. This may be altered or completely changed at the discretion of the oncoming team pending results of further workup.   Portions of this report may have been transcribed using voice recognition software. Every effort was made to ensure accuracy; however, inadvertent computerized transcription errors may be present.   I discussed this case with my attending physician who cosigned this note including patient's presenting symptoms, physical exam, and planned diagnostics and interventions. Attending physician stated agreement with plan or made changes to plan which were implemented.   Final Clinical Impression(s) / ED Diagnoses Final diagnoses:  Viral illness  Nausea and vomiting, unspecified vomiting type  Generalized headache     Rx / DC Orders ED Discharge Orders     None         Bernis Ernst, PA-C 04/13/23 9583    Tegeler, Kristina PARAS, MD 04/20/23 [phone]

## 2023-04-13 LAB — URINE CULTURE: Culture: <10000 — AB

## 2023-06-20 ENCOUNTER — Ambulatory Visit (HOSPITAL_COMMUNITY)
Admission: EM | Admit: 2023-06-20 | Discharge: 2023-06-20 | Disposition: A | Discharge status: Other Acute Inpatient Hospital | Payer: MEDICAID | Attending: Psychiatry | Admitting: Psychiatry

## 2023-06-20 ENCOUNTER — Emergency Department (HOSPITAL_COMMUNITY)
Admission: EM | Admit: 2023-06-20 | Discharge: 2023-06-21 | Disposition: A | Discharge status: Home / Self Care | Payer: MEDICAID | Attending: Emergency Medicine | Admitting: Emergency Medicine

## 2023-06-20 ENCOUNTER — Other Ambulatory Visit: Payer: Self-pay

## 2023-06-20 DIAGNOSIS — R456 Violent behavior: Secondary | ICD-10-CM | POA: Diagnosis present

## 2023-06-20 DIAGNOSIS — F23 Brief psychotic disorder: Secondary | ICD-10-CM | POA: Diagnosis not present

## 2023-06-20 DIAGNOSIS — F29 Unspecified psychosis not due to a substance or known physiological condition: Secondary | ICD-10-CM | POA: Insufficient documentation

## 2023-06-20 DIAGNOSIS — F15959 Other stimulant use, unspecified with stimulant-induced psychotic disorder, unspecified: Secondary | ICD-10-CM | POA: Insufficient documentation

## 2023-06-20 LAB — URINALYSIS, ROUTINE W REFLEX MICROSCOPIC
Bilirubin Urine: NEGATIVE
Glucose, UA: NEGATIVE mg/dL
Ketones, ur: 20 mg/dL — AB
Nitrite: NEGATIVE
Protein, ur: NEGATIVE mg/dL
Specific Gravity, Urine: 1.017 (ref 1.005–1.030)
pH: 5 (ref 5.0–8.0)

## 2023-06-20 LAB — COMPREHENSIVE METABOLIC PANEL
ALT: 15 U/L (ref 0–44)
AST: 41 U/L (ref 15–41)
Albumin: 3.7 g/dL (ref 3.5–5.0)
Alkaline Phosphatase: 59 U/L (ref 38–126)
Anion gap: 11 (ref 5–15)
BUN: 15 mg/dL (ref 6–20)
CO2: 23 mmol/L (ref 22–32)
Calcium: 9.2 mg/dL (ref 8.9–10.3)
Chloride: 103 mmol/L (ref 98–111)
Creatinine, Ser: 0.71 mg/dL (ref 0.44–1.00)
GFR, Estimated: >60 mL/min (ref >60)
Glucose, Bld: 90 mg/dL (ref 70–99)
Potassium: 4 mmol/L (ref 3.5–5.1)
Sodium: 137 mmol/L (ref 135–145)
Total Bilirubin: 1.1 mg/dL (ref 0.0–1.2)
Total Protein: 6.7 g/dL (ref 6.5–8.1)

## 2023-06-20 LAB — ETHANOL: Alcohol, Ethyl (B): <10 mg/dL (ref <10)

## 2023-06-20 LAB — MAGNESIUM: Magnesium: 1.7 mg/dL (ref 1.7–2.4)

## 2023-06-20 LAB — CBC WITH DIFFERENTIAL/PLATELET
Abs Immature Granulocytes: 0.03 10*3/uL (ref 0.00–0.07)
Basophils Absolute: 0 10*3/uL (ref 0.0–0.1)
Basophils Relative: 0 %
Eosinophils Absolute: 0 10*3/uL (ref 0.0–0.5)
Eosinophils Relative: 0 %
HCT: 36 % (ref 36.0–46.0)
Hemoglobin: 12.1 g/dL (ref 12.0–15.0)
Immature Granulocytes: 0 %
Lymphocytes Relative: 22 %
Lymphs Abs: 2.2 10*3/uL (ref 0.7–4.0)
MCH: 30.5 pg (ref 26.0–34.0)
MCHC: 33.6 g/dL (ref 30.0–36.0)
MCV: 90.7 fL (ref 80.0–100.0)
Monocytes Absolute: 0.7 10*3/uL (ref 0.1–1.0)
Monocytes Relative: 7 %
Neutro Abs: 7.3 10*3/uL (ref 1.7–7.7)
Neutrophils Relative %: 71 %
Platelets: 274 10*3/uL (ref 150–400)
RBC: 3.97 MIL/uL (ref 3.87–5.11)
RDW: 12.5 % (ref 11.5–15.5)
WBC: 10.3 10*3/uL (ref 4.0–10.5)
nRBC: 0 % (ref 0.0–0.2)

## 2023-06-20 LAB — SALICYLATE LEVEL: Salicylate Lvl: <7 mg/dL — ABNORMAL LOW (ref 7.0–30.0)

## 2023-06-20 LAB — RAPID URINE DRUG SCREEN, HOSP PERFORMED
Amphetamines: POSITIVE — AB
Barbiturates: NOT DETECTED
Benzodiazepines: POSITIVE — AB
Cocaine: NOT DETECTED
Opiates: NOT DETECTED
Tetrahydrocannabinol: NOT DETECTED

## 2023-06-20 LAB — HEMOGLOBIN A1C
Hgb A1c MFr Bld: 5.5 % (ref 4.8–5.6)
Mean Plasma Glucose: 111.15 mg/dL

## 2023-06-20 LAB — LIPID PANEL
Cholesterol: 179 mg/dL (ref 0–200)
HDL: 77 mg/dL (ref >40)
LDL Cholesterol: 96 mg/dL (ref 0–99)
Total CHOL/HDL Ratio: 2.3 ratio
Triglycerides: 31 mg/dL (ref <150)
VLDL: 6 mg/dL (ref 0–40)

## 2023-06-20 LAB — ACETAMINOPHEN LEVEL: Acetaminophen (Tylenol), Serum: <10 ug/mL — ABNORMAL LOW (ref 10–30)

## 2023-06-20 LAB — GLUCOSE, CAPILLARY: Glucose-Capillary: 90 mg/dL (ref 70–99)

## 2023-06-20 LAB — HCG, SERUM, QUALITATIVE: Preg, Serum: NEGATIVE

## 2023-06-20 LAB — TSH: TSH: 2.645 u[IU]/mL (ref 0.350–4.500)

## 2023-06-20 MED ORDER — HALOPERIDOL 5 MG PO TABS: 5.0000 mg | ORAL_TABLET | Freq: Three times a day (TID) | ORAL | Status: DC | PRN

## 2023-06-20 MED ORDER — HALOPERIDOL LACTATE 5 MG/ML IJ SOLN: 10.0000 mg | Freq: Three times a day (TID) | INTRAMUSCULAR | Status: DC | PRN

## 2023-06-20 MED ORDER — HYDROXYZINE HCL 25 MG PO TABS: 25.0000 mg | ORAL_TABLET | Freq: Three times a day (TID) | ORAL | Status: DC | PRN

## 2023-06-20 MED ORDER — LORAZEPAM 2 MG/ML IJ SOLN: 2.0000 mg | Freq: Three times a day (TID) | INTRAMUSCULAR | Status: DC | PRN

## 2023-06-20 MED ORDER — DIPHENHYDRAMINE HCL 50 MG/ML IJ SOLN: 50.0000 mg | Freq: Three times a day (TID) | INTRAMUSCULAR | Status: DC | PRN

## 2023-06-20 MED ORDER — DIPHENHYDRAMINE HCL 50 MG PO CAPS: 50.0000 mg | ORAL_CAPSULE | Freq: Three times a day (TID) | ORAL | Status: DC | PRN

## 2023-06-20 MED ORDER — LORAZEPAM 1 MG PO TABS: 1.0000 mg | ORAL_TABLET | ORAL | Status: DC

## 2023-06-20 MED ORDER — NICOTINE 21 MG/24HR TD PT24
21.0000 mg | MEDICATED_PATCH | Freq: Every day | TRANSDERMAL | Status: DC
  Administered 2023-06-21: 21 mg via TRANSDERMAL
  Filled 2023-06-20: qty 1

## 2023-06-20 MED ORDER — HALOPERIDOL LACTATE 5 MG/ML IJ SOLN: 5.0000 mg | Freq: Three times a day (TID) | INTRAMUSCULAR | Status: DC | PRN

## 2023-06-20 MED ORDER — OLANZAPINE 5 MG PO TABS
5.0000 mg | ORAL_TABLET | Freq: Two times a day (BID) | ORAL | Status: DC
  Administered 2023-06-21: 5 mg via ORAL
  Filled 2023-06-20 (×2): qty 1

## 2023-06-20 MED ORDER — ALUM & MAG HYDROXIDE-SIMETH 200-200-20 MG/5ML PO SUSP: 30.0000 mL | ORAL | Status: DC | PRN

## 2023-06-20 MED ORDER — MAGNESIUM HYDROXIDE 400 MG/5ML PO SUSP: 30.0000 mL | Freq: Every day | ORAL | Status: DC | PRN

## 2023-06-20 MED ORDER — LORAZEPAM 2 MG/ML IJ SOLN
INTRAMUSCULAR | Status: AC
  Administered 2023-06-20: 2 mg via INTRAMUSCULAR
  Filled 2023-06-20: qty 1

## 2023-06-20 MED ORDER — HALOPERIDOL LACTATE 5 MG/ML IJ SOLN
INTRAMUSCULAR | Status: AC
  Administered 2023-06-20: 10 mg via INTRAMUSCULAR
  Filled 2023-06-20: qty 2

## 2023-06-20 MED ORDER — ACETAMINOPHEN 325 MG PO TABS: 650.0000 mg | ORAL_TABLET | ORAL | Status: DC | PRN

## 2023-06-20 MED ORDER — OLANZAPINE 5 MG PO TABS
5.0000 mg | ORAL_TABLET | ORAL | 2 refills | Status: DC
Start: 2023-06-20 — End: 2024-01-31

## 2023-06-20 MED ORDER — OLANZAPINE 5 MG PO TBDP: 5.0000 mg | ORAL_TABLET | Freq: Three times a day (TID) | ORAL | Status: DC | PRN

## 2023-06-20 MED ORDER — LORAZEPAM 1 MG PO TABS: 1.0000 mg | ORAL_TABLET | ORAL | Status: DC | PRN

## 2023-06-20 MED ORDER — LACTATED RINGERS IV BOLUS
1000.0000 mL | Freq: Once | INTRAVENOUS | Status: AC
  Administered 2023-06-20: 1000 mL via INTRAVENOUS

## 2023-06-20 MED ORDER — DIPHENHYDRAMINE HCL 50 MG/ML IJ SOLN
INTRAMUSCULAR | Status: AC
  Administered 2023-06-20: 50 mg via INTRAMUSCULAR
  Filled 2023-06-20: qty 1

## 2023-06-20 NOTE — ED Notes (Signed)
 Patient kicking the door, punching walls, scratching staff, and throwing the trash can. Verbally abusing staff and other patients in the assessment area. Attempted to de-escalate patient and re-direct them into the assessment room, patient yelled at nurse and stated "I'm satan you motherfucker, I'm gonna kill you!" Administered PRN agitation medication with assistance from security. Safety maintained, environment secured.

## 2023-06-20 NOTE — ED Notes (Signed)
 This RN tried to get IV and blood work three times with no success.

## 2023-06-20 NOTE — ED Provider Notes (Addendum)
 [facility] Medical Screening Exam  Patient Name: Kristina Hardy MRN: [id number] Date of Evaluation: 06/20/23 Chief Complaint:  via [employer] after wondering in neighborhood and getting into cars of people she does not know.  Diagnosis:  Final diagnoses:  Psychosis, unspecified psychosis type (HCC)    History of Present illness: Kristina Hardy is a 53 y.o. femalepatient presented to [facility] as a walk in accompanied by [employer] after wondering in neighborhood and getting into cars of people she does not know.   [name], 53 y.o., female patient seen face to face by this provider, consulted with Dr.Goli; and chart reviewed on 06/20/23.  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  Currently on assessment patient is restless and moving around the assessment room.  She yells, "I need to take a shit and my feet are on fire" she ran to the bathroom pulls her pants down and uses the bathroom.  She has feces on her undergarments.  She is given clean undergarments and pants.  She is screaming at her feet saying, "they burned a burn the burn".  She is yelling and because this Chemical engineer" and a " bitch".  She is hyperactive and restless.  She continues to have pressured and loud speech throughout the assessment.  She is tangential.  She is psychotic.  She believes that [name] is with her and then states she is Licensed conveyancer.  She believes that he has been salvia all of her feet and that is why they are on fire.  She believes that [name] is talking to her and she believes he is standing in the room next to this Clinical research associate.  She is actively responding to internal stimuli.  She is extremely paranoid and looks at this Clinical research associate and whispers, "I cannot get out of here is not safe".  She also continues to ask for her Suboxone throughout the assessment.  Per staff she did have a Suboxone pill in her pocket.  Patient received Haldol 5 mg IM, Benadryl 50 mg IM  and Ativan 1 mg IM, with little to no effect  Per PDMP she is currently prescribed Suboxone 8-2 mg film strips.  Patient placed under IVC by this provider.  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  Respondent was brought in by [employer] after staring into peoples homes and getting into other peoples cars.  Respondent is currently psychotic.  Respondent is actively hallucinating.  Respondent believes that she sees Satan in the room and [name] is talking to her.  Respondent is extremely paranoid and delusional.  Respondent believes that [name] has spewed buring salavia all over her feet and they are on fire.  Respondent is yelling and banging the walls.  Respondent is aggressive and threatening staff.  Respondent cannot be discharged safely at this time as she is a danger to herself and others". Kristina Hardy ED from 04/11/2023 in [facility] Emergency Department at [facility] ED from 04/04/2023 in [facility] Emergency Department at [facility] ED from 03/16/2023 in [facility] Emergency Department at [facility]  C-SSRS RISK CATEGORY No Risk No Risk No Risk       Psychiatric Specialty Exam  Presentation  General Appearance:Disheveled  Eye Contact:Poor  Speech:Pressured  Speech Volume:Increased  Handedness:Right   Mood and Affect  Mood: Labile; Irritable  Affect: Labile   Thought Process  Thought Processes:  Disorganized  Descriptions of Associations:Tangential  Orientation:Partial  Thought Content:Paranoid Ideation; Tangential; Delusions; Illogical  Diagnosis of Schizophrenia or Schizoaffective disorder in past: -- (unknown- wont answer)   Hallucinations:Auditory; Visual Observed talking to Satan and responding states to this provider that satan in room  Ideas of Reference:Delusions; Paranoia  Suicidal Thoughts:-- (wont answer)  Homicidal Thoughts:-- (wont  answer)   Sensorium  Memory: Remote Poor; Recent Poor; Immediate Poor  Judgment: Poor  Insight: Poor   Executive Functions  Concentration: Poor  Attention Span: Poor  Recall: Poor  Fund of Knowledge: Poor  Language: Poor   Psychomotor Activity  Psychomotor Activity: Restlessness; Increased Akathisia   Assets  Assets: Physical Health   Sleep  Sleep: Fair  Number of hours:  0 (unkown)   Physical Exam: Physical Exam Eyes:     General:        Right eye: No discharge.        Left eye: No discharge.  Cardiovascular:     Rate and Rhythm: Normal rate.  Pulmonary:     Effort: Pulmonary effort is normal. No respiratory distress.  Musculoskeletal:        General: No swelling. Normal range of motion.     Cervical back: Normal range of motion.  Neurological:     Mental Status: She is alert and oriented to person, place, and time.  Psychiatric:        Attention and Perception: She is inattentive. She perceives auditory and visual hallucinations.        Mood and Affect: Affect is labile and angry.        Speech: Speech is rapid and pressured.        Behavior: Behavior is agitated, aggressive and hyperactive.        Thought Content: Thought content is paranoid and delusional. Thought content includes homicidal ideation.        Cognition and Memory: Cognition is impaired. Memory is impaired.        Judgment: Judgment is impulsive.    Review of Systems  Unable to perform ROS: Acuity of condition   Resp. rate 20, last menstrual period 11/17/2011. There is no height or weight on file to calculate BMI.  Musculoskeletal: Strength & Muscle Tone: within normal limits Gait & Station: normal Patient leans: N/A   [facility] MSE Discharge Disposition for Follow up and Recommendations: Based on my evaluation the patient appears to have an emergency medical condition for which I recommend the patient be transferred to the emergency department for further evaluation.    Spoke with Dr. Ria Clock medical director.  Recommendation is for patient to be sent to [facility] ED for medical clearance.  At this time [facility] is unable to accommodate patient due to her extreme aggressive behaviors.  Dr. Jarold Motto at [facility] emergency department has agreed to accept the patient.  Discussed with Dr. Jarold Motto that if patient is able to be stabilized and would be able to function on an open milieu she could be reviewed to return to [facility].  IVC and first exam completed  Recommendations while in ED.   Please obtain CBC, CMP, TSH, ethanol, magnesium, hemoglobin A1c, lipid panel, UDS, U/A, urinalysis and EKG  Medication recommendations: Zyprexa 5 mg twice daily    Ardis Hughs, NP 06/20/2023, 11:30 AM

## 2023-06-20 NOTE — ED Provider Notes (Signed)
 Patient IVC completed, she received haldol 5 mg IM ativan 1 mg IM, bendadryl 50 mg IM at 1140am. She is now very lethargic and unable to arouse. She is responding to pain. her manual bp is 82/68, HR 78 and O2 98.She will be transferred to [facility] via EMS.  Dr. Eloise Harman notified.

## 2023-06-20 NOTE — ED Notes (Signed)
[employer] at bedside 

## 2023-06-20 NOTE — ED Triage Notes (Signed)
 Pt BIB [facility] from [facility] due to aggressive behaviors.  Pt is IVC'd.  [employer] at bedside.  Pt was given 50mg  Benadryl, 2mg  Ativan and 10 Haldol IM given at [facility].   VS BP 102/70, HR 80, CBG 114, SpO2 98%
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
## 2023-06-20 NOTE — ED Notes (Signed)
 Pt wheeled to unit with NAD noted; pt got into bed and cover up in the blanket; pt is now sleeping-Monique,RN

## 2023-06-20 NOTE — ED Notes (Signed)
 April in staffing called again about sending sitter down for IVC'd patient.  Currently no sitter available.  Charge RN [place] aware.  MD Criss Alvine notified and aware.

## 2023-06-20 NOTE — BH Assessment (Signed)
 TTS attempted to assess pt. Per S. Earlene Plater, RN, pt is sound asleep and did not wake up after an attempt to wake pt up was made. TTS will be notified once pt is awake and alert.

## 2023-06-20 NOTE — ED Notes (Signed)
 Staffing called by this RN.  April from staffing reports no sitters available at this time.  Charge RN [place] notified.  MD Eloise Harman notified and aware.

## 2023-06-20 NOTE — Discharge Instructions (Addendum)
Transfer to [facility] for medical clearance. °
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
## 2023-06-21 DIAGNOSIS — F15959 Other stimulant use, unspecified with stimulant-induced psychotic disorder, unspecified: Secondary | ICD-10-CM | POA: Diagnosis not present

## 2023-06-21 MED ORDER — BUPRENORPHINE HCL-NALOXONE HCL 8-2 MG SL SUBL
1.0000 | SUBLINGUAL_TABLET | Freq: Every day | SUBLINGUAL | Status: DC
  Administered 2023-06-21: 1 via SUBLINGUAL
  Filled 2023-06-21: qty 1

## 2023-06-21 MED ORDER — ONDANSETRON HCL 4 MG PO TABS
4.0000 mg | ORAL_TABLET | Freq: Four times a day (QID) | ORAL | Status: DC
  Administered 2023-06-21: 4 mg via ORAL
  Filled 2023-06-21: qty 1

## 2023-06-21 NOTE — ED Provider Notes (Signed)
 Emergency Medicine Observation Re-evaluation Note  Kristina Hardy is a 53 y.o. female, seen on rounds today.  Pt initially presented to the ED for complaints of Aggressive Behavior Currently, the patient is IVC and awaiting behavioral health assessment.  Patient was sent over from behavioral health urgent care for psychosis.[name]  Physical Exam  BP 109/80   Pulse 65   Temp 98.9 F (37.2 C) (Oral)   Resp 12   LMP 11/17/2011   SpO2 98%  Physical Exam General: Nontoxic no acute distress sleeping Cardiac:  Lungs: No respiratory distress Psych: Sleeping unable to assess  ED Course / MDM  EKG:EKG Interpretation Date/Time:  Sunday June 20 2023 16:12:19 EDT Ventricular Rate:  63 PR Interval:  133 QRS Duration:  99 QT Interval:  419 QTC Calculation: 429 R Axis:   87  Text Interpretation: Sinus rhythm overall similar to Jan 2025 Confirmed by Pricilla Loveless [phone]) on 06/20/2023 4:29:38 PM  I have reviewed the labs performed to date as well as medications administered while in observation.  Recent changes in the last 24 hours include behavioral health attempted to interview her but she was sleeping.  They will try later today..  Plan  Current plan is for behavioral health assessment sent over from behavioral health urgent care for psychosis.  Patient is IVC.    Vanetta Mulders, MD 06/21/23 [phone]

## 2023-06-21 NOTE — ED Notes (Signed)
 Refuse to get her vital signs taken before she left

## 2023-06-21 NOTE — Discharge Instructions (Signed)
 Homeless Shelter List:  Health and safety inspector [facility]) [address] [place], [place]  Phone: [phone]  [facility] [address], [place], [place] [address] Phone: [phone]  [facility] (Women only) [address]Cyril Loosen [place], [place] [address] Phone: [phone]  [facility] [address], [place] [address] Phone: [phone]  [facility]: [phone]. [address] [place], [place] [address] Phone: [phone]  [facility]  [address] [place], [place] [address] (Check in at 6:00PM for placement at a local shelter) Phone: [phone] Interactive Resource Center  Hours Monday - Friday: Services: 8:00AM - 3:00PM Offices: 8:00AM - 5:00PM  Physical Address [address] [place], [place] [address]   Please use this address for [facility] Mailing Address [address] [address] [place], [place] [address]  The [facility] helps people reconnect This is a safe place to rest, take care of basic needs and access the services and community that make all the difference. Our guests come to the [facility] to take a class, do laundry, meet with a case manager or to get their mail. Sometimes they just need to sit in our dayroom and enjoy a conversation.  Here you will find everything from shower facilities to a computer lab, a mail room, classrooms and meeting spaces.  The IRC helps people reconnect with their own lives and with the community at large.  A caring community setting One of the most exciting aspects of the IRC is that so many individuals and organizations in the community are a part of the everyday experience. Whether it's a hair stylist or law firm offering services right in-house, our partners make the [facility] a truly interactive resource center where services are brought to our guests. The IRC brings together a comprehensive community of talented people who not only  want to help solve problems, but also to be a part of our guests' lives.  Integrated Care We take a person-centered approach to assistance that includes: Case management Geneticist, molecular Medical clinic Mental health nurse Referrals  Fundamental Services We start with necessities: Midwife and Armed forces operational officer addresses and mailboxes Replacement IDs Onsite barbershop Storage lockers [place] winter warming center  Self-Sufficiency We connect our guests with: Skilled trade classes Job skills classes Resume and jobs application assistance Interview training GED Counsellor Center [facility]) Monday - Friday 8am - 3pm          Sat & Sun 8am - 2pm [address] [place], [place] [address]   [phone]     www.interactiveresourcecenter.org IRC offers among other critical resources: showers, laundry, barbershop, phone bank, mailroom, computer lab, medical clinic, gardens and a bike maintenance area.   AREA SHELTERS  [facility] [employer]  (Men & women) [address] [place] [phone]  [facility] (Men/women/families) [address] [place] [phone]   [facility] (Families with children) [phone]  [place] [phone]   [facility] (Domestic Violence Shelter) [address]  [place] [phone]   [facility] (Children ages 32-17) [address]  [place] [phone]   [facility]   (Women & children) [address] [place] [phone]   [facility] (Women/substance abuse) [address] [place]  [place] [phone]   [facility] (Men) [address]  [employer] [phone]   [facility] (Men) [address]  [place] [phone]  [employer] (Women) [phone]  [place] [phone]    [facility] (Single women & women with children) [address]  [place] Allegra [phone]  [facility] (Men/women/families) [address] [phone]    [facility]   (Domestic Violence shelter) [address]  [place] [phone]   [facility] (Men & women) [address]  [place] [phone]  [facility] (Men) [address]  [employer] [phone]   [facility] (Men) [address]  [place] [phone]   Holiday representative (Single women & families) [address]  [place] [phone]  Crisis [facility]. (Men/women & families) [address]  [place] [phone]    If you are at risk of losing your housing (throughout [facility]) call the Housing Hotline at 425-686-2350. You may also contact 2-1-1, a FREE service of the [address] [address] that provides information about many resources including housing. Dial 211, or visit online at PooledIncome.pl. [facility]:  House of [place], contact Janelle Floor [phone] (men only)                          Huey Romans in [place]:  90 day homeless program for women and men;                             contact Rev. Chambers [phone]  [facility]:  [facility]:  men/women/children [phone]  [facility]:  Shelter in [place], [place] [phone]                       [facility] in [place], [place] [phone]   [facility]:  Crisis Council for Abused Women, [phone]; (women and children)  [facility]:  [employer], men/women/children; [phone]                           [facility] in [place], [phone]; substance abuse halfway house for men              [facility]; 4 bedroom house in [facility] for homeless women, contact Jenne Campus [phone]               [facility] in [place], [phone] (women and children)                Friend to Friend, for abused women and children, 24 hour crisis line, [phone], Jamison Oka  [facility], halfway house for women, [place], [phone]  [facility]:  [facility], [phone]; open Mon-Thurs from OMV67 - March 15 when temp is below 32 degrees                              [facility]; Alwyn Pea [place], [phone]; cell [phone]; open 24/7  [facility]:  Outreach for [place] - Rev Ladona Ridgel [phone]  [place]:  transitional housing for women and children; Arminda Resides [phone] we are unable to provide direct housing, financial, or social services, [employer] cares deeply for this community. We would be happy to connect you with other organizations in our community that may be able to provide assistance. We invite you to connect with [place] for spiritual support  and prayer during difficult times. Our hearts are with you, and we hope these resources can help point you in a positive direction. Please reach out if we can provide additional support.  [facility] Career Center Address: [address], [place], [place] [address] Phone: [phone] Website: View Here Hours: 9am-4 pm. (closed 12:30 for lunch)  Description: You can come to [place] for help with your resume and cover letter. We help instruct individuals on how to apply for jobs online and give tips on how to prepare for interviews. We host hiring events every week and our helpful staff can work with you on a one-on-one basis.  [place] [employer] Surveyor, minerals Address: [address], [place], [place] [address] Phone: [phone] Website: View Here Hours: M,T,TH: 9:00am-4:00pm W: 9:00am-6:00pm F: 9:00am-2:00pm  Description: [employer] is Statistician. Job seekers can Financial controller for jobs, create resumes, and find education and training. Employers can find candidates, post jobs, and Hydrographic surveyor.  [facility] Address: [address] [place], [place], [place] [address] Phone: [phone] Website: View Here Hours: M-F: 8:30am-5pm  Description: [employer] is a 51 (c)(3) organization that provides free job readiness training, active mentoring, and supportive services to help individuals find and keep jobs and build stable lives.  [employer] Address: [address] [place], [place] [address] Phone: [phone] Website: View Here Hours: M-F: 8:30am-5pm  Description: Employment program for formerly incarcerated Field seismologist Location: Address: [address] [phone]  Phone: [phone] Website: View Here Hours: Unknown  [place] Location: Address: [address], Suite [place] [address] Phone: [phone] Website: View Here Hours: Unknown  Description: Support of employment for individuals with a disability Outpatient psychiatric Services  Walk in hours for medication management Monday, Wednesday, Thursday, and Friday from 8:00 AM to 11:00 AM Recommend arriving by by 7:30 AM.  It is first come first serve.    Walk in hours for therapy intake Monday and Wednesday only 8:00 AM to 11:00 AM Encouraged to arrive by 7:30 AM.  It is first come first serve   Inpatient patient psychiatric services The Facility Based Crisis Unit offers comprehensive behavioral heath care services for mental health and substance abuse treatment.  Social work can also assist with referral to or getting you into a rehabilitation program short or long term

## 2023-06-21 NOTE — ED Provider Notes (Signed)
 Patient cleared by behavioral health for discharge.  Patient refusing any help with substance abuse.   Vanetta Mulders, MD 06/21/23 [phone]

## 2023-06-21 NOTE — Consult Note (Signed)
 [facility] Psychiatric Consult Initial  Patient Name: .Kristina Hardy  MRN: [id number]  DOB: 1970/11/17  Consult Order details:  Orders (From admission, onward)     Start     Ordered   06/20/23 2001  CONSULT TO CALL ACT TEAM       Ordering Provider: Pricilla Loveless, MD  Provider:  (Not yet assigned)  Question:  Reason for Consult?  Answer:  Psych consult   06/20/23 2001             Mode of Visit: In person    Psychiatry Consult Evaluation  Service Date: June 21, 2023 LOS:  LOS: 0 days  Chief Complaint "I want to leave"  Primary Psychiatric Diagnoses  Methamphetamine-induced psychotic disorder 2.  delusions 3.    Assessment  Kristina Hardy is a 53 y.o. female admitted: Presented to the EDfor 06/20/2023  3:05 PM for aggression, delusional behaviors. She carries the psychiatric diagnoses of MDD and has a past medical history of  polysubstance abuse.   Her current presentation of irritability is most consistent with substance induced psychotic disorder. She does not meet criteria for inpatient admission.  Current outpatient psychotropic medications include none. On initial examination, patient is irritable, however does not appear to be psychotic or delusional, and is requesting discharge. Please see plan below for detailed recommendations.   Diagnoses:  Active Hospital problems: Principal Problem:   Methamphetamine-induced psychotic disorder (HCC)    Plan   ## Psychiatric Medication Recommendations:  - Pt does not want psychotropic medications at this time - Continue Suboxone   ## Medical Decision Making Capacity: Not specifically addressed in this encounter  ## Further Work-up:  None at this time  ## Disposition:-- There are no psychiatric contraindications to discharge at this time  ## Behavioral / Environmental: - No specific recommendations at this time.     ## Safety and Observation Level:  - Based on my clinical evaluation, I estimate the patient to be  at low risk of self harm in the current setting. - At this time, we recommend  routine. This decision is based on my review of the chart including patient's history and current presentation, interview of the patient, mental status examination, and consideration of suicide risk including evaluating suicidal ideation, plan, intent, suicidal or self-harm behaviors, risk factors, and protective factors. This judgment is based on our ability to directly address suicide risk, implement suicide prevention strategies, and develop a safety plan while the patient is in the clinical setting. Please contact our team if there is a concern that risk level has changed.  CSSR Risk Category:C-SSRS RISK CATEGORY: No Risk  Suicide Risk Assessment: Patient has following modifiable risk factors for suicide: recklessness and lack of access to outpatient mental health resources, which we are addressing by providing resources in AVS for mental health follow up and substance abuse. Patient has following non-modifiable or demographic risk factors for suicide: substance abuse Patient has the following protective factors against suicide: Supportive friends and Cultural, spiritual, or religious beliefs that discourage suicide  Thank you for this consult request. Recommendations have been communicated to the primary team.  We will psych clear for discharge at this time.   [name], NP       History of Present Illness  Relevant Aspects of Hospital ED Course:  53 year old female sent over from behavioral health urgent care for psychosis. Originally she was delusional, makign comments about satan being in the room, and aggressive. 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 History is limited as the patient is unresponsive after receiving IM Haldol, Benadryl, and Ativan.   Since [facility] ED admission, pt is responsive, more awake, and able to engage in conversation. No  aggression while in the ED.   Patient Report:  Patient seen at [facility], ED for face-to-face psychiatric evaluation.  Patient originally presented to behavioral health urgent care where she presented psychotic, delusional, making statements about [name] being in the room, vulgar comments to staff, and aggressive.  Patient was given IM Haldol 5 mg, Benadryl 50 mg, and Ativan 1 mg.  Patient then became very sedated was unable to awaken with stimulation and was sent to [facility], ED for medical evaluation and clearance.  Per chart review patient does have a history of brief psychotic disorder usually in the influence of methamphetamines.  Today patient is cooperative, slightly irritable but pleasant overall, and states she has been doing very well for 2 months but had a relapse on meth a couple days ago.  Since relapsing on meth she is lost her job and is now unemployed.  She has been staying at the [facility] and is hopeful she can return there.  Patient is labile and tearful at times she is very disappointed in her relapse.  She is denying wanting any substance abuse treatment at this time.  Patient is wanting to discharge.  Patient denies any suicidal ideations.  She does mention one previous suicide attempt, she states it was so long ago she does not remember how many years ago was.  Denies any access to weapons or firearms.  Denies HI.  Denies AVH.  Patient denies any feelings of paranoia.  During conversation patient made no delusional statements, no evidence of psychosis at this time.  I did mention to her some of the, she made last night in which she states "yeah that does sound pretty crazy."  Patient denies any hyperreligious delusions, or any delusions about Satan at this time.  I did offer patient to stay in the hospital, and about possible inpatient or [facility] admission however she declines at this time and states she would rather discharge as she is worried about her belongings at [place]  house and worried they might try and kick her out.  I did express understanding of concerns.  Her goal is to remain clean and try and find employment again.  She denies any family support in the area but does report having a friend her to that are supportive.  She denies therapy or any current psychiatric follow-up.  Denies current psychotropic medications other than Suboxone.  Will provide resources in AVS for follow-up.  During evaluation Kristina Hardy is sitting in no acute distress.  She is alert, oriented x 4, calm, cooperative and attentive.  Her mood is anxious with congruent affect.  She has normal speech, and behavior.  Objectively there is no evidence of psychosis/mania or delusional thinking.  Patient is able to converse coherently, goal directed thoughts, no distractibility, or pre-occupation.  She also denies suicidal/self-harm/homicidal ideation, psychosis, and paranoia.  Patient answered question appropriately.    While future psychiatric events cannot be accurately predicted, the patient does not currently require further acute psychiatric care and does not currently meet [facility] involuntary commitment criteria. It is recommended that the patient continue treatment in outpatient care. A follow up plan and crisis plan are in place, have been discussed with the patient, and the patient agrees  to the plan at time of discharge.    Collateral information:  Patient denies, states there is no one to call  Review of Systems  Psychiatric/Behavioral:  Positive for substance abuse. The patient is nervous/anxious and has insomnia.   All other systems reviewed and are negative.    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 rate 16, last menstrual period 11/17/2011, SpO2 100%. There is no height or weight on file to calculate BMI.  Physical Exam Vitals and nursing note reviewed.  Neurological:     Mental Status: She is alert and oriented to person, place, and time.  Psychiatric:        Attention and Perception: Attention normal.        Mood and Affect: Mood is anxious.        Speech: Speech normal.        Behavior: Behavior is cooperative.        Thought Content: Thought content normal.     Mental Status Exam: General Appearance: Fairly Groomed  Orientation:  Full (Time, Place, and Person)  Memory:  Immediate;   Good Recent;   Good  Concentration:  Concentration: Good  Recall:  Good  Attention  Fair  Eye Contact:  Fair  Speech:  Clear and Coherent  Language:  Good  Volume:  Normal  Mood: "fine"  Affect:  Labile  Thought Process:  Coherent  Thought Content:  WDL  Suicidal Thoughts:  No  Homicidal Thoughts:  No  Judgement:  Fair  Insight:  Fair  Psychomotor Activity:  Restlessness  Akathisia:  No  Fund of Knowledge:  Fair      Assets:  Communication Skills Desire for Improvement Physical Health Resilience  Cognition:  WNL  ADL's:  Intact  AIMS (if indicated):         Other History   These have been pulled in through the EMR, reviewed, and updated if appropriate.  Family History:  The patient's family history is not on file.  Medical History: Past Medical History:  Diagnosis Date   Anxiety    Chronic back pain    Depression    Endometriosis    Hepatitis C    Hyperthyroidism     Surgical History: Past Surgical History:  Procedure Laterality Date   RHINOPLASTY     TONSILLECTOMY       Medications:   Current Facility-Administered Medications:    acetaminophen (TYLENOL) tablet 650 mg, 650 mg, Oral, Q4H PRN, Pricilla Loveless, MD   buprenorphine-naloxone (SUBOXONE) 8-2 mg per SL tablet 1 tablet, 1 tablet, Sublingual, Daily, [name], NP,  1 tablet at 06/21/23 1029   [DISCONTINUED] OLANZapine zydis (ZYPREXA) disintegrating tablet 5 mg, 5 mg, Oral, Q8H PRN **AND** LORazepam (ATIVAN) tablet 1 mg, 1 mg, Oral, PRN, Pricilla Loveless, MD   nicotine (NICODERM CQ - dosed in mg/24 hours) patch 21 mg, 21 mg, Transdermal, Daily, Pricilla Loveless, MD, 21 mg at 06/21/23 1030   OLANZapine (ZYPREXA) tablet 5 mg, 5 mg, Oral, BID, Pricilla Loveless, MD, 5 mg at 06/21/23 1029   ondansetron (ZOFRAN) tablet 4 mg, 4 mg, Oral, Q6H, [name], NP, 4 mg at 06/21/23 1029  Current Outpatient Medications:    Buprenorphine HCl-Naloxone HCl 8-2 MG FILM, Place 1 Film under the tongue daily., Disp: , Rfl:    ibuprofen (ADVIL) 200 MG tablet, Take 400-800 mg by mouth 2 (two) times daily as needed for moderate pain. (Patient not taking: Reported on 03/16/2023), Disp: , Rfl:    loratadine (CLARITIN) 10 MG tablet, Take 1 tablet (10 mg total) by mouth daily. (Patient not taking: Reported on 03/16/2023), Disp: 30 tablet, Rfl: 0   nicotine (NICODERM CQ - DOSED IN MG/24 HOURS) 21 mg/24hr patch, Place 1 patch (21 mg total) onto the skin daily. (Patient not taking: Reported on 03/16/2023), Disp: 28 patch, Rfl: 0   nicotine polacrilex (NICORETTE) 2 MG gum, Take 1 each (2 mg  total) by mouth as needed for smoking cessation. (Patient not taking: Reported on 03/16/2023), Disp: 100 tablet, Rfl: 0   OLANZapine (ZYPREXA) 5 MG tablet, Take 1 tablet (5 mg total) by mouth now for 1 dose., Disp: 30 tablet, Rfl: 2   omeprazole (PRILOSEC) 20 MG capsule, Take 1 capsule (20 mg total) by mouth daily. (Patient not taking: Reported on 01/15/2023), Disp: 30 capsule, Rfl: 0   ondansetron (ZOFRAN) 4 MG tablet, Take 1 tablet (4 mg total) by mouth every 6 (six) hours., Disp: 12 tablet, Rfl: 0   traZODone (DESYREL) 50 MG tablet, Take 1 tablet (50 mg total) by mouth at bedtime as needed for sleep. (Patient not taking: Reported on 03/16/2023), Disp: 30 tablet, Rfl: 0  Allergies: Allergies  Allergen Reactions   Nsaids Other (See Comments)    Stomach pain when taken on empty stomach   Robaxin [Methocarbamol] Nausea Only   Vicodin [Hydrocodone-Acetaminophen] Nausea And Vomiting and Rash    [name], NP

## 2023-06-23 ENCOUNTER — Ambulatory Visit: Admitting: Physician Assistant

## 2023-06-23 ENCOUNTER — Encounter: Payer: Self-pay | Admitting: Physician Assistant

## 2023-06-23 VITALS — BP 108/72 | HR 86 | Wt 95.0 lb

## 2023-06-23 DIAGNOSIS — Z5901 Sheltered homelessness: Secondary | ICD-10-CM | POA: Diagnosis not present

## 2023-06-23 DIAGNOSIS — K649 Unspecified hemorrhoids: Secondary | ICD-10-CM | POA: Diagnosis not present

## 2023-06-23 MED ORDER — HYDROCORTISONE ACETATE 25 MG RE SUPP: 25.0000 mg | Freq: Two times a day (BID) | RECTAL | 0 refills | Status: DC

## 2023-06-23 MED ORDER — DOCUSATE SODIUM 100 MG PO CAPS: 100.0000 mg | ORAL_CAPSULE | Freq: Two times a day (BID) | ORAL | 0 refills | Status: DC

## 2023-06-23 MED ORDER — POLYETHYLENE GLYCOL 3350 17 GM/SCOOP PO POWD: 17.0000 g | Freq: Two times a day (BID) | ORAL | 1 refills | Status: DC | PRN

## 2023-06-23 NOTE — Progress Notes (Signed)
 New Patient Office Visit  Subjective    Patient ID: Kristina Hardy, female    DOB: March 17, 1971  Age: 53 y.o. MRN: [id number]  CC:  Chief Complaint  Patient presents with   Hemorrhoids   Discussed the use of AI scribe software for clinical note transcription with the patient, who gave verbal consent to proceed.  History of Present Illness  The patient, with a history of hemorrhoids, presents with worsening symptoms over the past few weeks. She reports a history of hemorrhoids during childbirth approximately 29-35 years ago, but has not had any issues until recently. The patient attributes the recent flare-up to constipation, which she believes is due to a change in diet and holding bowel movements due to privacy concerns at her current living situation in a shelter. She describes a burning sensation and specks of bright red blood upon wiping. She also reports a sensation of incomplete evacuation during bowel movements. The patient admits to not drinking enough water, consuming approximately 32 ounces per day. She has been on Suboxone for over a year, but does not believe it contributes to her constipation.      Outpatient Encounter Medications as of 06/23/2023  Medication Sig   docusate sodium (COLACE) 100 MG capsule Take 1 capsule (100 mg total) by mouth 2 (two) times daily.   hydrocortisone (ANUSOL-HC) 25 MG suppository Place 1 suppository (25 mg total) rectally 2 (two) times daily.   polyethylene glycol powder (GLYCOLAX/MIRALAX) 17 GM/SCOOP powder Take 17 g by mouth 2 (two) times daily as needed.   Buprenorphine HCl-Naloxone HCl 8-2 MG FILM Place 1 Film under the tongue daily.   ibuprofen (ADVIL) 200 MG tablet Take 400-800 mg by mouth 2 (two) times daily as needed for moderate pain. (Patient not taking: Reported on 03/16/2023)   loratadine (CLARITIN) 10 MG tablet Take 1 tablet (10 mg total) by mouth daily. (Patient not taking: Reported on 03/16/2023)   nicotine (NICODERM CQ - DOSED IN  MG/24 HOURS) 21 mg/24hr patch Place 1 patch (21 mg total) onto the skin daily. (Patient not taking: Reported on 03/16/2023)   nicotine polacrilex (NICORETTE) 2 MG gum Take 1 each (2 mg total) by mouth as needed for smoking cessation. (Patient not taking: Reported on 03/16/2023)   OLANZapine (ZYPREXA) 5 MG tablet Take 1 tablet (5 mg total) by mouth now for 1 dose.   omeprazole (PRILOSEC) 20 MG capsule Take 1 capsule (20 mg total) by mouth daily. (Patient not taking: Reported on 01/15/2023)   ondansetron (ZOFRAN) 4 MG tablet Take 1 tablet (4 mg total) by mouth every 6 (six) hours.   traZODone (DESYREL) 50 MG tablet Take 1 tablet (50 mg total) by mouth at bedtime as needed for sleep. (Patient not taking: Reported on 03/16/2023)   No facility-administered encounter medications on file as of 06/23/2023.    Past Medical History:  Diagnosis Date   Anxiety    Chronic back pain    Depression    Endometriosis    Hepatitis C    Hyperthyroidism     Past Surgical History:  Procedure Laterality Date   RHINOPLASTY     TONSILLECTOMY      History reviewed. No pertinent family history.  Social History   Socioeconomic History   Marital status: Divorced    Spouse name: Not on file   Number of children: Not on file   Years of education: Not on file   Highest education level: Not on file  Occupational History   Not on file  Tobacco Use   Smoking status: Every Day    Current packs/day: 1.00    Types: Cigarettes   Smokeless tobacco: Not on file  Substance and Sexual Activity   Alcohol use: No   Drug use: No    Comment: hx of opiod abuse   Sexual activity: Not on file  Other Topics Concern   Not on file  Social History Narrative   ** Merged History Encounter **       Social Drivers of Corporate investment banker Strain: Not on file  Food Insecurity: Not on file  Transportation Needs: Not on file  Physical Activity: Not on file  Stress: Not on file  Social Connections: Unknown  (11/25/2021)   [facility] from [facility]   Social Network    Social Network: Not on file  Intimate Partner Violence: Unknown (11/25/2021)   Received from [facility], [facility]   HITS    Physically Hurt: Not on file    Insult or Talk Down To: Not on file    Threaten Physical Harm: Not on file    Scream or Curse: Not on file    Review of Systems  Constitutional: Negative.   HENT: Negative.    Eyes: Negative.   Respiratory:  Negative for shortness of breath.   Cardiovascular:  Negative for chest pain.  Gastrointestinal:  Positive for constipation. Negative for blood in stool.  Genitourinary: Negative.   Musculoskeletal: Negative.   Skin: Negative.   Neurological: Negative.   Endo/Heme/Allergies: Negative.   Psychiatric/Behavioral: Negative.          Objective    BP 108/72 (BP Location: Left Arm, Patient Position: Sitting, Cuff Size: Normal)   Pulse 86   Wt 95 lb (43.1 kg)   LMP 11/17/2011   SpO2 96%   BMI 17.38 kg/m   Physical Exam Vitals and nursing note reviewed.  Constitutional:      Appearance: Normal appearance.  HENT:     Head: Normocephalic and atraumatic.     Right Ear: External ear normal.     Left Ear: External ear normal.     Nose: Nose normal.     Mouth/Throat:     Mouth: Mucous membranes are moist.     Pharynx: Oropharynx is clear.  Eyes:     Extraocular Movements: Extraocular movements intact.     Conjunctiva/sclera: Conjunctivae normal.     Pupils: Pupils are equal, round, and reactive to light.  Cardiovascular:     Rate and Rhythm: Normal rate and regular rhythm.     Pulses: Normal pulses.     Heart sounds: Normal heart sounds.  Pulmonary:     Effort: Pulmonary effort is normal.     Breath sounds: Normal breath sounds.  Genitourinary:    Comments: Deferred by patient Musculoskeletal:        General: Normal range of motion.     Cervical back: Normal range of motion and neck supple.  Skin:    General: Skin is warm  and dry.  Neurological:     General: No focal deficit present.     Mental Status: She is alert and oriented to person, place, and time.  Psychiatric:        Mood and Affect: Mood normal.        Behavior: Behavior normal.        Thought Content: Thought content normal.        Judgment: Judgment normal.  Assessment & Plan:   Problem List Items Addressed This Visit   None Visit Diagnoses       Hemorrhoids, unspecified hemorrhoid type    -  Primary   Relevant Medications   hydrocortisone (ANUSOL-HC) 25 MG suppository   polyethylene glycol powder (GLYCOLAX/MIRALAX) 17 GM/SCOOP powder   docusate sodium (COLACE) 100 MG capsule     Sheltered homelessness         1. Hemorrhoids, unspecified hemorrhoid type (Primary)  Chronic hemorrhoids with recent exacerbation due to constipation. Discussed symptom management  - Prescribe hemorrhoid suppositories to reduce swelling. - Advise increasing water intake to at least 64 ounces per day. - Recommend increasing dietary fiber intake within current dietary limitations. - Suggest using Tucks pads and baby wipes for comfort and hygiene.  - hydrocortisone (ANUSOL-HC) 25 MG suppository; Place 1 suppository (25 mg total) rectally 2 (two) times daily.  Dispense: 12 suppository; Refill: 0 - polyethylene glycol powder (GLYCOLAX/MIRALAX) 17 GM/SCOOP powder; Take 17 g by mouth 2 (two) times daily as needed.  Dispense: 3350 g; Refill: 1 - docusate sodium (COLACE) 100 MG capsule; Take 1 capsule (100 mg total) by mouth 2 (two) times daily.  Dispense: 10 capsule; Refill: 0  2. Sheltered homelessness    I have reviewed the patient's medical history (PMH, PSH, Social History, Family History, Medications, and allergies) , and have been updated if relevant. I spent 30 minutes reviewing chart and  face to face time with patient.     Return if symptoms worsen or fail to improve.   Kasandra Knudsen Mayers, PA-C

## 2023-06-23 NOTE — Patient Instructions (Signed)
 VISIT SUMMARY:  Today, we discussed your recent flare-up of hemorrhoids, which you believe is due to constipation from changes in your diet and living situation. You reported a burning sensation, bright red blood upon wiping, and a feeling of incomplete evacuation during bowel movements. We reviewed your current water intake and dietary habits.  YOUR PLAN:  -HEMORRHOIDS: Hemorrhoids are swollen veins in the lower rectum and anus, often caused by straining during bowel movements, chronic constipation, or sitting for long periods. To manage your symptoms and avoid surgery, I have prescribed hemorrhoid suppositories to reduce swelling. Please increase your water intake to at least 64 ounces per day and try to consume more dietary fiber within your current dietary limitations. For comfort and hygiene, use Tucks pads and baby wipes. If your symptoms persist or you suspect a prolapse, I will refer you to a gastroenterologist. I also sent miralax and stool softeners to your pharmacy to help with constipation.   INSTRUCTIONS:  Please follow up with me if your symptoms do not improve or if you experience any new issues. If your hemorrhoids persist or you suspect a prolapse, I will refer you to a gastroenterologist for further evaluation.  Roney Jaffe, PA-C Physician Assistant [facility] Medicine [email or web address]  Hemorrhoids Hemorrhoids are swollen veins in and around the rectum or the opening of the butt (anus). There are two types of hemorrhoids: Internal. These occur in the veins just inside the rectum. They may poke through to the outside and become irritated and painful. External. These occur in the veins outside the anus. They can be felt as a painful swelling or hard lump near the anus. Most hemorrhoids do not cause severe problems. Often, they can be treated at home with diet and lifestyle changes. If home treatments do not help, you may need a  procedure to shrink or remove the hemorrhoids. What are the causes? Hemorrhoids are caused by pressure near the anus. This pressure may be caused by: Constipation or diarrhea. Straining to poop. Pregnancy. Obesity. Sitting or riding a bike for a long time. Heavy lifting or other things that cause you to strain. Anal sex. What are the signs or symptoms? Symptoms of this condition include: Pain. Anal itching or irritation. Bleeding from the rectum. Leakage of poop (stool). Swelling of the anus. One or more lumps around the anus. How is this diagnosed? Hemorrhoids can often be diagnosed through a visual exam. Other exams or tests may also be done, such as: A digital rectal exam. This is when your health care provider feels inside your rectum with a gloved finger. Anoscope. This is an exam of the anus using a small tube. A blood test, if you have lost a lot of blood. A sigmoidoscopy or colonoscopy. These are tests to look inside the colon using a tube with a camera on the end. How is this treated? In most cases, hemorrhoids can be treated at home with diet and lifestyle changes. If these changes do not help, you may need to have a procedure done. These procedures can make the hemorrhoids smaller or fully remove them. Common procedures include: Rubber band ligation. Rubber bands are placed at the base of the hemorrhoids to cut off their blood supply. Sclerotherapy. Medicine is put into the hemorrhoids to shrink them. Infrared coagulation. A type of light energy is used to get rid of the hemorrhoids. Hemorrhoidectomy surgery. The hemorrhoids are removed during surgery. Then, the veins that supply them are tied off. Stapled hemorrhoidopexy surgery. The  base of the hemorrhoid is stapled to the wall of the rectum. Follow these instructions at home: Medicines Take over-the-counter and prescription medicines only as told by your provider. Use medicated creams or medicines that are put in the  rectum (suppositories) as told by your provider. Eating and drinking  Eat foods that are high in fiber, such as beans, whole grains, and fresh fruits and vegetables. Ask your provider about taking products that have fiber added to them (fiber supplements). Reduce the amount of fat in your diet. You can do this by eating low-fat dairy products, eating less red meat, and avoiding processed foods. Drink enough fluid to keep your pee (urine) pale yellow. Managing pain and swelling  Take warm sitz baths for 20 minutes, 3-4 times a day. This can help ease pain and discomfort. You may do this in a bathtub or you can use a portable sitz bath that fits over the toilet. If told, put ice on the affected area. It may help to use ice packs between sitz baths. Put ice in a plastic bag. Place a towel between your skin and the bag. Leave the ice on for 20 minutes, 2-3 times a day. If your skin turns bright red, remove the ice right away to prevent skin damage. The risk of damage is higher if you cannot feel pain, heat, or cold. General instructions Exercise. Ask your provider how much and what kind of exercise is best for you. In general, you should do moderate exercise for at least 30 minutes on most days of the week (150 minutes each week). You may want to try walking, biking, or yoga. Go to the bathroom when you have the urge to poop. Do not wait. Avoid straining to poop. Keep the anus dry and clean. Use wet toilet paper or moist towelettes after you poop. Do not sit on the toilet for a long time. This can increase blood pooling and pain. Where to find more information [employer] of Diabetes and Digestive and Kidney Diseases: [employer] Contact a health care provider if: You have more pain and swelling that do not get better with treatment. You have trouble pooping or you are not able to poop. You have pain or inflammation outside the area of the hemorrhoids. Get help right away if: You are  bleeding from your rectum and you cannot get it to stop. This information is not intended to replace advice given to you by your health care provider. Make sure you discuss any questions you have with your health care provider. Document Revised: 12/03/2021 Document Reviewed: 12/03/2021 Elsevier Patient Education  2024 [employer].

## 2023-07-11 ENCOUNTER — Other Ambulatory Visit: Payer: Self-pay

## 2023-07-11 ENCOUNTER — Encounter (HOSPITAL_COMMUNITY): Payer: Self-pay

## 2023-07-11 ENCOUNTER — Emergency Department (HOSPITAL_COMMUNITY)
Admission: EM | Admit: 2023-07-11 | Discharge: 2023-07-11 | Disposition: A | Discharge status: Home / Self Care | Attending: Emergency Medicine | Admitting: Emergency Medicine

## 2023-07-11 DIAGNOSIS — L853 Xerosis cutis: Secondary | ICD-10-CM | POA: Diagnosis not present

## 2023-07-11 DIAGNOSIS — Z5900 Homelessness unspecified: Secondary | ICD-10-CM | POA: Insufficient documentation

## 2023-07-11 DIAGNOSIS — M79672 Pain in left foot: Secondary | ICD-10-CM | POA: Diagnosis present

## 2023-07-11 DIAGNOSIS — Z79899 Other long term (current) drug therapy: Secondary | ICD-10-CM | POA: Insufficient documentation

## 2023-07-11 DIAGNOSIS — E039 Hypothyroidism, unspecified: Secondary | ICD-10-CM | POA: Diagnosis not present

## 2023-07-11 NOTE — ED Triage Notes (Signed)
 Patient reports bilateral pain on the bottom of her feet. 10/10 pain. No other complaints

## 2023-07-11 NOTE — ED Notes (Signed)
 This RN reviewed discharge instructions with patient. She verbalized understanding and denied any further questions. PT well appearing upon discharge and reports tolerable pain. Pt ambulated with stable gait to exit.

## 2023-07-11 NOTE — Discharge Instructions (Signed)
 As discussed, recommend getting over-the-counter Aquaphor to place on the soles of your feet which should help with the cracking/peeling.  Recommend follow-up with your primary care for reassessment of your symptoms.  Please do not hesitate to return if the worrisome signs and symptoms we discussed become apparent.

## 2023-07-11 NOTE — ED Provider Notes (Signed)
 [place] EMERGENCY DEPARTMENT AT [facility] Provider Note   CSN: [id number] Arrival date & time: 07/11/23  0157     History  Chief Complaint  Patient presents with   Foot Pain    Kristina Hardy is a 53 y.o. female.   Foot Pain    53 year old female presents emergency department with complaints of dry bilateral feet.  Patient states that she has noticed dry skin on the bottom of her feet that has been flaking off.  States that her feet hurt when she walks on them for a long time but currently has no pain.  Denies any known trauma/injury.  Denies any fevers, chills, night sweats.  Presents emergency department for further assessment.  Past medical history significant for chronic back pain, hypothyroidism, hepatitis C, depression, anxiety, homelessness, MDD hallucinations, methamphetamine induced psychotic disorder  Home Medications Prior to Admission medications   Medication Sig Start Date End Date Taking? Authorizing Provider  Buprenorphine HCl-Naloxone HCl 8-2 MG FILM Place 1 Film under the tongue daily.    [provider]  docusate sodium (COLACE) 100 MG capsule Take 1 capsule (100 mg total) by mouth 2 (two) times daily. 06/23/23   Mayers, Cari S, PA-C  hydrocortisone (ANUSOL-HC) 25 MG suppository Place 1 suppository (25 mg total) rectally 2 (two) times daily. 06/23/23   Mayers, Cari S, PA-C  ibuprofen (ADVIL) 200 MG tablet Take 400-800 mg by mouth 2 (two) times daily as needed for moderate pain. Patient not taking: Reported on 03/16/2023    [provider]  loratadine (CLARITIN) 10 MG tablet Take 1 tablet (10 mg total) by mouth daily. Patient not taking: Reported on 03/16/2023 01/25/23   Bobbye Morton, MD  nicotine (NICODERM CQ - DOSED IN MG/24 HOURS) 21 mg/24hr patch Place 1 patch (21 mg total) onto the skin daily. Patient not taking: Reported on 03/16/2023 01/25/23   Bobbye Morton, MD  nicotine polacrilex (NICORETTE) 2 MG gum Take 1 each (2 mg  total) by mouth as needed for smoking cessation. Patient not taking: Reported on 03/16/2023 01/24/23   Bobbye Morton, MD  OLANZapine (ZYPREXA) 5 MG tablet Take 1 tablet (5 mg total) by mouth now for 1 dose. 06/20/23 06/20/23  Ardis Hughs, NP  omeprazole (PRILOSEC) 20 MG capsule Take 1 capsule (20 mg total) by mouth daily. Patient not taking: Reported on 01/15/2023 01/07/23   Roemhildt, Lorin T, PA-C  ondansetron (ZOFRAN) 4 MG tablet Take 1 tablet (4 mg total) by mouth every 6 (six) hours. 04/12/23   Achille Rich, PA-C  polyethylene glycol powder (GLYCOLAX/MIRALAX) 17 GM/SCOOP powder Take 17 g by mouth 2 (two) times daily as needed. 06/23/23   Mayers, Cari S, PA-C  traZODone (DESYREL) 50 MG tablet Take 1 tablet (50 mg total) by mouth at bedtime as needed for sleep. Patient not taking: Reported on 03/16/2023 01/24/23   Bobbye Morton, MD      Allergies    Nsaids, Robaxin [methocarbamol], and Vicodin [hydrocodone-acetaminophen]    Review of Systems   Review of Systems  All other systems reviewed and are negative.   Physical Exam Updated Vital Signs BP 109/82 (BP Location: Right Arm)   Pulse 87   Temp 98.7 F (37.1 C) (Oral)   Resp 17   Ht 5\' 2"  (1.575 m)   Wt 45.4 kg   LMP 11/17/2011   SpO2 100%   BMI 18.29 kg/m  Physical Exam Vitals and nursing note reviewed.  Constitutional:  General: She is not in acute distress.    Appearance: She is well-developed.  HENT:     Head: Normocephalic and atraumatic.  Eyes:     Conjunctiva/sclera: Conjunctivae normal.  Cardiovascular:     Rate and Rhythm: Normal rate and regular rhythm.     Heart sounds: No murmur heard. Pulmonary:     Effort: Pulmonary effort is normal. No respiratory distress.     Breath sounds: Normal breath sounds.  Abdominal:     Palpations: Abdomen is soft.     Tenderness: There is no abdominal tenderness.  Musculoskeletal:        General: No swelling.     Cervical back: Neck supple.     Comments: Full  range of motion bilateral ankle/digits.  Pedal and posterior tibial pulses 2+ bilaterally.  No overlying erythema, palpable fluctuance/induration.  No lower extremity swelling.  Dry skin appreciated on the soles of bilateral feet.  Skin:    General: Skin is warm and dry.     Capillary Refill: Capillary refill takes less than 2 seconds.  Neurological:     Mental Status: She is alert.  Psychiatric:        Mood and Affect: Mood normal.     ED Results / Procedures / Treatments   Labs (all labs ordered are listed, but only abnormal results are displayed) Labs Reviewed - No data to display  EKG None  Radiology No results found.  Procedures Procedures    Medications Ordered in ED Medications - No data to display  ED Course/ Medical Decision Making/ A&P                                 Medical Decision Making  This patient presents to the ED for concern of foot pain, this involves an extensive number of treatment options, and is a complaint that carries with it a high risk of complications and morbidity.  The differential diagnosis includes fracture, strain/pain, dislocation, ligamentous/tendinous injury, neurovascular compromise, cellulitis, erysipelas, necrotizing infection, ischemic limb, DVT, tinea, other   Co morbidities that complicate the patient evaluation  See HPI   Additional history obtained:  Additional history obtained from EMR External records from outside source obtained and reviewed including hospital records   Lab Tests:  N/a   Imaging Studies ordered:  N/a   Cardiac Monitoring: / EKG:  The patient was maintained on a cardiac monitor.  I personally viewed and interpreted the cardiac monitored which showed an underlying rhythm of: sinus rhythm   Consultations Obtained:  N/a   Problem List / ED Course / Critical interventions / Medication management  Xerosis cutis Reevaluation of the patient  showed that the patient stayed the same I have  reviewed the patients home medicines and have made adjustments as needed   Social Determinants of Health:  Polysubstance use.  Homelessness.   Test / Admission - Considered:  Xerosis cutis Vitals signs  within normal range and stable throughout visit. 53 year old female presents emergency department with complaints of dry bilateral feet.  Patient states that she has noticed dry skin on the bottom of her feet that has been flaking off.  States that her feet hurt when she walks on them for a long time but currently has no pain.  Denies any known trauma/injury.  Denies any fevers, chills, night sweats.  Presents emergency department for further assessment. On exam, dry skin appreciated on the soles of feet.  Area is not consistent with tinea pedis.  No pulse deficits to suggest ischemic limb.  No overlying skin changes concerning for secondary infectious process.  No lower extremity swelling concerning for DVT.  No obvious bony tenderness at all on exam; low suspicion for fracture/dislocation.  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  Will recommend topical hydration therapy and follow-up with PCP.  Treatment plan discussed with patient and she acknowledged understanding was agreeable to said plan.  Patient overall well-appearing, afebrile in no acute distress. Worrisome signs and symptoms were discussed with the patient, and the patient acknowledged understanding to return to the ED if noticed. Patient was stable upon discharge.          Final Clinical Impression(s) / ED Diagnoses Final diagnoses:  Xerosis cutis    Rx / DC Orders ED Discharge Orders     None         Peter Garter, [place] 07/11/23 [id number]    Tilden Fossa, MD 07/11/23 [phone]

## 2023-07-31 ENCOUNTER — Emergency Department (HOSPITAL_COMMUNITY)
Admission: EM | Admit: 2023-07-31 | Discharge: 2023-07-31 | Disposition: A | Discharge status: Home / Self Care | Attending: Emergency Medicine | Admitting: Emergency Medicine

## 2023-07-31 ENCOUNTER — Encounter (HOSPITAL_COMMUNITY): Payer: Self-pay

## 2023-07-31 DIAGNOSIS — E039 Hypothyroidism, unspecified: Secondary | ICD-10-CM | POA: Diagnosis not present

## 2023-07-31 DIAGNOSIS — R451 Restlessness and agitation: Secondary | ICD-10-CM

## 2023-07-31 DIAGNOSIS — F22 Delusional disorders: Secondary | ICD-10-CM | POA: Diagnosis not present

## 2023-07-31 MED ORDER — DROPERIDOL 2.5 MG/ML IJ SOLN: 2.5000 mg | Freq: Once | INTRAMUSCULAR | Status: DC

## 2023-07-31 NOTE — ED Notes (Signed)
 Belongings returned to pt at d/c  pt is ambulatory a&ox4 steady gait noted
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
## 2023-07-31 NOTE — ED Notes (Signed)
 Pt. Refused blood work being done. Pt. Pulled the needle out of her arm while trying to get blood work.
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
## 2023-08-22 ENCOUNTER — Encounter (HOSPITAL_COMMUNITY): Payer: Self-pay

## 2023-08-22 ENCOUNTER — Emergency Department (HOSPITAL_COMMUNITY)

## 2023-08-22 ENCOUNTER — Other Ambulatory Visit: Payer: Self-pay

## 2023-08-22 ENCOUNTER — Emergency Department (HOSPITAL_COMMUNITY)
Admission: EM | Admit: 2023-08-22 | Discharge: 2023-08-22 | Disposition: A | Discharge status: Home / Self Care | Attending: Emergency Medicine | Admitting: Emergency Medicine

## 2023-08-22 DIAGNOSIS — F17210 Nicotine dependence, cigarettes, uncomplicated: Secondary | ICD-10-CM | POA: Diagnosis not present

## 2023-08-22 DIAGNOSIS — M545 Low back pain, unspecified: Secondary | ICD-10-CM | POA: Insufficient documentation

## 2023-08-22 LAB — PREGNANCY, URINE: Preg Test, Ur: NEGATIVE

## 2023-08-22 NOTE — Discharge Instructions (Signed)
 You were evaluated in the Emergency Department and after careful evaluation, we did not find any emergent condition requiring admission or further testing in the hospital.  Your exam/testing today is overall reassuring.  X-ray did not show any broken bones or emergencies.  Recommend Tylenol  and Motrin  at home for discomfort.  Please return to the Emergency Department if you experience any worsening of your condition.   Thank you for allowing us  to be a part of your care.
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
## 2023-09-25 ENCOUNTER — Emergency Department (HOSPITAL_COMMUNITY)

## 2023-09-25 ENCOUNTER — Encounter (HOSPITAL_COMMUNITY): Payer: Self-pay

## 2023-09-25 ENCOUNTER — Emergency Department (HOSPITAL_COMMUNITY)
Admission: EM | Admit: 2023-09-25 | Discharge: 2023-09-25 | Discharge status: Against Medical Advice | Attending: Emergency Medicine | Admitting: Emergency Medicine

## 2023-09-25 ENCOUNTER — Other Ambulatory Visit: Payer: Self-pay

## 2023-09-25 DIAGNOSIS — Z5321 Procedure and treatment not carried out due to patient leaving prior to being seen by health care provider: Secondary | ICD-10-CM | POA: Diagnosis not present

## 2023-09-25 DIAGNOSIS — R300 Dysuria: Secondary | ICD-10-CM | POA: Diagnosis not present

## 2023-09-25 DIAGNOSIS — M545 Low back pain, unspecified: Secondary | ICD-10-CM | POA: Diagnosis present

## 2023-09-25 MED ORDER — DEXAMETHASONE SODIUM PHOSPHATE 10 MG/ML IJ SOLN
10.0000 mg | Freq: Once | INTRAMUSCULAR | Status: AC
  Administered 2023-09-25: 10 mg via INTRAMUSCULAR
  Filled 2023-09-25: qty 1

## 2023-09-25 MED ORDER — KETOROLAC TROMETHAMINE 15 MG/ML IJ SOLN
15.0000 mg | Freq: Once | INTRAMUSCULAR | Status: AC
  Administered 2023-09-25: 15 mg via INTRAMUSCULAR
  Filled 2023-09-25: qty 1
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
## 2023-09-25 NOTE — ED Triage Notes (Addendum)
 Pt arrived via [facility] c/o back pain that is radiating bilaterally to her feet. 8/10 on pain scale. Described as sharp pain. After triage was complete pt stated that she thinks she may have a UTI because it burns to pee. Pt would like to have her urine checked.

## 2023-09-25 NOTE — ED Notes (Signed)
 Pt unable to provide urine sample at this time

## 2023-09-25 NOTE — ED Notes (Signed)
Pt was called x 3 No answer

## 2023-10-08 ENCOUNTER — Emergency Department (HOSPITAL_COMMUNITY)
Admission: EM | Admit: 2023-10-08 | Discharge: 2023-10-08 | Discharge status: Against Medical Advice | Attending: Emergency Medicine | Admitting: Emergency Medicine

## 2023-10-08 ENCOUNTER — Encounter (HOSPITAL_COMMUNITY): Payer: Self-pay

## 2023-10-08 ENCOUNTER — Other Ambulatory Visit: Payer: Self-pay

## 2023-10-08 ENCOUNTER — Emergency Department (HOSPITAL_COMMUNITY)

## 2023-10-08 DIAGNOSIS — M545 Low back pain, unspecified: Secondary | ICD-10-CM | POA: Insufficient documentation

## 2023-10-08 MED ORDER — ACETAMINOPHEN 500 MG PO TABS
1000.0000 mg | ORAL_TABLET | Freq: Once | ORAL | Status: AC
  Administered 2023-10-08: 1000 mg via ORAL
  Filled 2023-10-08: qty 2

## 2023-10-08 MED ORDER — KETOROLAC TROMETHAMINE 30 MG/ML IJ SOLN
30.0000 mg | Freq: Once | INTRAMUSCULAR | Status: AC
  Administered 2023-10-08: 30 mg via INTRAMUSCULAR
  Filled 2023-10-08: qty 1

## 2023-10-08 MED ORDER — METHYLPREDNISOLONE 4 MG PO TBPK: ORAL_TABLET | ORAL | 0 refills | Status: DC
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
## 2023-10-08 NOTE — Discharge Instructions (Signed)
 While you were in the emergency room, you had a CT scan of your lumbar spine that did not show any new changes.  Include your discharge paperwork is a telephone number for one of our spine centers.  You can call them on Monday to set a follow-up appointment.  You can take Motrin  and Tylenol  at home.  I have sent you prescription for a steroid Dosepak.  You may begin taking that as instructed by the dose packaging.  Return to the emergency room if you develop fever, difficulty walking or weakness in your legs.

## 2023-10-08 NOTE — ED Notes (Signed)
 Provider notified of elopement.

## 2023-10-08 NOTE — ED Triage Notes (Addendum)
 Pt c/o back pain for past couple of weeks, states she has been more active recently which has made chronic pain worse. Pt has pain going down left leg and blisters on right foot that are also hurting. Pt states she has had stool incontinence a few weeks ago which has happened a few times in the past.
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
## 2023-10-27 NOTE — Progress Notes (Signed)
 Patient came to mobile screening at [employer]. BP was 77/63. It was offered for pt to go to mobile unit on site but she declined. Declined SDOH.

## 2023-12-17 ENCOUNTER — Other Ambulatory Visit: Payer: Self-pay

## 2023-12-17 ENCOUNTER — Emergency Department (HOSPITAL_COMMUNITY)
Admission: EM | Admit: 2023-12-17 | Discharge: 2023-12-18 | Disposition: A | Discharge status: Home / Self Care | Attending: Emergency Medicine | Admitting: Emergency Medicine

## 2023-12-17 DIAGNOSIS — F1910 Other psychoactive substance abuse, uncomplicated: Secondary | ICD-10-CM | POA: Insufficient documentation

## 2023-12-17 DIAGNOSIS — F15959 Other stimulant use, unspecified with stimulant-induced psychotic disorder, unspecified: Secondary | ICD-10-CM | POA: Diagnosis not present

## 2023-12-17 DIAGNOSIS — Z760 Encounter for issue of repeat prescription: Secondary | ICD-10-CM | POA: Diagnosis not present

## 2023-12-17 DIAGNOSIS — M545 Low back pain, unspecified: Secondary | ICD-10-CM | POA: Diagnosis present

## 2023-12-17 DIAGNOSIS — R4689 Other symptoms and signs involving appearance and behavior: Secondary | ICD-10-CM | POA: Diagnosis present

## 2023-12-17 NOTE — ED Triage Notes (Signed)
 BIB [employer] after being called for person down  Found talking to herself and lucifer. Denies SI/HI. Pt appears manic in triage and intermittently yelling. +agitation.

## 2023-12-18 ENCOUNTER — Encounter (HOSPITAL_COMMUNITY): Payer: Self-pay

## 2023-12-18 ENCOUNTER — Emergency Department (HOSPITAL_COMMUNITY)
Admission: EM | Admit: 2023-12-18 | Discharge: 2023-12-18 | Disposition: A | Discharge status: Home / Self Care | Source: Home / Self Care | Attending: Emergency Medicine | Admitting: Emergency Medicine

## 2023-12-18 ENCOUNTER — Other Ambulatory Visit: Payer: Self-pay

## 2023-12-18 DIAGNOSIS — Z760 Encounter for issue of repeat prescription: Secondary | ICD-10-CM | POA: Insufficient documentation

## 2023-12-18 DIAGNOSIS — M545 Low back pain, unspecified: Secondary | ICD-10-CM | POA: Insufficient documentation

## 2023-12-18 LAB — COMPREHENSIVE METABOLIC PANEL WITH GFR
ALT: 17 U/L (ref 0–44)
AST: 32 U/L (ref 15–41)
Albumin: 4.6 g/dL (ref 3.5–5.0)
Alkaline Phosphatase: 84 U/L (ref 38–126)
Anion gap: 14 (ref 5–15)
BUN: UNDETERMINED mg/dL (ref 6–20)
CO2: 20 mmol/L — ABNORMAL LOW (ref 22–32)
Calcium: 9.8 mg/dL (ref 8.9–10.3)
Chloride: 107 mmol/L (ref 98–111)
Creatinine, Ser: 0.84 mg/dL (ref 0.44–1.00)
GFR, Estimated: >60 mL/min (ref >60)
Glucose, Bld: 84 mg/dL (ref 70–99)
Potassium: 4.2 mmol/L (ref 3.5–5.1)
Sodium: 141 mmol/L (ref 135–145)
Total Bilirubin: 0.5 mg/dL (ref 0.0–1.2)
Total Protein: 8.1 g/dL (ref 6.5–8.1)

## 2023-12-18 LAB — RAPID URINE DRUG SCREEN, HOSP PERFORMED
Amphetamines: POSITIVE — AB
Barbiturates: NOT DETECTED
Benzodiazepines: NOT DETECTED
Cocaine: NOT DETECTED
Opiates: NOT DETECTED
Tetrahydrocannabinol: NOT DETECTED

## 2023-12-18 LAB — CBC
HCT: 40.7 % (ref 36.0–46.0)
Hemoglobin: 13.3 g/dL (ref 12.0–15.0)
MCH: 30.3 pg (ref 26.0–34.0)
MCHC: 32.7 g/dL (ref 30.0–36.0)
MCV: 92.7 fL (ref 80.0–100.0)
Platelets: 217 K/uL (ref 150–400)
RBC: 4.39 MIL/uL (ref 3.87–5.11)
RDW: 12 % (ref 11.5–15.5)
WBC: 10.1 K/uL (ref 4.0–10.5)
nRBC: 0 % (ref 0.0–0.2)

## 2023-12-18 LAB — ETHANOL: Alcohol, Ethyl (B): <15 mg/dL (ref <15)

## 2023-12-18 MED ORDER — FAMOTIDINE 20 MG PO TABS
20.0000 mg | ORAL_TABLET | Freq: Once | ORAL | Status: AC
  Administered 2023-12-18: 20 mg via ORAL
  Filled 2023-12-18: qty 1

## 2023-12-18 MED ORDER — IBUPROFEN 400 MG PO TABS
600.0000 mg | ORAL_TABLET | Freq: Once | ORAL | Status: AC
  Administered 2023-12-18: 600 mg via ORAL
  Filled 2023-12-18: qty 1

## 2023-12-18 NOTE — ED Notes (Signed)
 Lab to come and attempt lab draw.

## 2023-12-18 NOTE — ED Notes (Signed)
 PD present with IVC paperwork. Given to orange [employer] operational officer.
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
## 2023-12-18 NOTE — ED Notes (Signed)
 Multiple attempts made by several different staff for blood. Unable to obtain enough for a ethanol level at this time.
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
## 2023-12-18 NOTE — ED Notes (Signed)
 All labs re drawn and sent down via lab tech.

## 2023-12-18 NOTE — ED Notes (Signed)
 Extra Blue, Red, SST & DG drawn

## 2023-12-18 NOTE — ED Provider Notes (Signed)
 [place] EMERGENCY DEPARTMENT AT [facility] Provider Note   CSN: [id number] Arrival date & time: 12/17/23  2350     Patient presents with: Psychiatric Evaluation   Assunta Pupo is a 53 y.o. female.   HPI     This is a 53 year old female brought in by [employer] with IVC paperwork in place for concern for psychosis.  They responded to person down they found the patient talking to herself and referencing Lucifer.  Was noted to be yelling and talking out of her head.  She refused EMS evaluation.  IVC paperwork was obtained by police.  Patient is awake, alert, cooperative on my evaluation.  She has no complaints.  She states she feels cold and would like a blanket.  Denies any physical complaints except for back pain.  Denies SI or HI.  Denies drug use but was noted to have positive methamphetamines on her UDS.  History of methamphetamine induced psychosis.  IVC paperwork reviewed.  Aside from what appears to be hallucinations and acute psychosis, no harm to self or others indicated.  Patient is now awake and cooperative.  IVC paperwork rescinded.  Prior to Admission medications   Medication Sig Start Date End Date Taking? Authorizing Provider  Buprenorphine  HCl-Naloxone  HCl 8-2 MG FILM Place 1 Film under the tongue daily.    [provider]  docusate sodium  (COLACE) 100 MG capsule Take 1 capsule (100 mg total) by mouth 2 (two) times daily. 06/23/23   Mayers, Cari S, PA-C  hydrocortisone  (ANUSOL -HC) 25 MG suppository Place 1 suppository (25 mg total) rectally 2 (two) times daily. 06/23/23   Mayers, Cari S, PA-C  ibuprofen  (ADVIL ) 200 MG tablet Take 400-800 mg by mouth 2 (two) times daily as needed for moderate pain. Patient not taking: Reported on 03/16/2023    [provider]  loratadine  (CLARITIN ) 10 MG tablet Take 1 tablet (10 mg total) by mouth daily. Patient not taking: Reported on 03/16/2023 01/25/23   McQuilla, Jai B, MD  methylPREDNISolone  (MEDROL  DOSEPAK)  4 MG TBPK tablet Take as instructed by dose packaging 10/08/23   Mannie Pac T, DO  nicotine  (NICODERM CQ  - DOSED IN MG/24 HOURS) 21 mg/24hr patch Place 1 patch (21 mg total) onto the skin daily. Patient not taking: Reported on 03/16/2023 01/25/23   McQuilla, Jai B, MD  nicotine  polacrilex (NICORETTE ) 2 MG gum Take 1 each (2 mg total) by mouth as needed for smoking cessation. Patient not taking: Reported on 03/16/2023 01/24/23   Juleen Reggie NOVAK, MD  OLANZapine  (ZYPREXA ) 5 MG tablet Take 1 tablet (5 mg total) by mouth now for 1 dose. 06/20/23 06/20/23  Mardy Elveria DEL, NP  omeprazole  (PRILOSEC) 20 MG capsule Take 1 capsule (20 mg total) by mouth daily. Patient not taking: Reported on 01/15/2023 01/07/23   Roemhildt, Lorin T, PA-C  ondansetron  (ZOFRAN ) 4 MG tablet Take 1 tablet (4 mg total) by mouth every 6 (six) hours. 04/12/23   Bernis Ernst, PA-C  polyethylene glycol powder (GLYCOLAX /MIRALAX ) 17 GM/SCOOP powder Take 17 g by mouth 2 (two) times daily as needed. 06/23/23   Mayers, Cari S, PA-C  traZODone  (DESYREL ) 50 MG tablet Take 1 tablet (50 mg total) by mouth at bedtime as needed for sleep. Patient not taking: Reported on 03/16/2023 01/24/23   Juleen Reggie NOVAK, MD    Allergies: Nsaids, Robaxin  Barrie.Bogaert ], and Vicodin [hydrocodone-acetaminophen ]    Review of Systems  Constitutional:  Negative for fever.  Respiratory:  Negative for shortness of breath.  Cardiovascular:  Negative for chest pain.  Gastrointestinal:  Negative for abdominal pain.  Psychiatric/Behavioral:  Positive for hallucinations. The patient is nervous/anxious.   All other systems reviewed and are negative.   Updated Vital Signs BP 113/80 (BP Location: Right Wrist)   Pulse 60   Temp 97.9 F (36.6 C) (Oral)   Resp 12   Ht 1.575 m (5' 2)   Wt 43 kg   LMP 11/17/2011   SpO2 100%   BMI 17.34 kg/m   Physical Exam Vitals and nursing note reviewed.  Constitutional:      Appearance: She is well-developed.      Comments: Disheveled but nontoxic-appearing  HENT:     Head: Normocephalic and atraumatic.     Mouth/Throat:     Mouth: Mucous membranes are dry.  Eyes:     Pupils: Pupils are equal, round, and reactive to light.  Cardiovascular:     Rate and Rhythm: Normal rate and regular rhythm.  Pulmonary:     Effort: Pulmonary effort is normal. No respiratory distress.  Musculoskeletal:     Cervical back: Neck supple.  Skin:    General: Skin is warm and dry.  Neurological:     Mental Status: She is alert and oriented to person, place, and time.  Psychiatric:     Comments: Short but cooperative     (all labs ordered are listed, but only abnormal results are displayed) Labs Reviewed  COMPREHENSIVE METABOLIC PANEL WITH GFR - Abnormal; Notable for the following components:      Result Value   CO2 20 (*)    All other components within normal limits  RAPID URINE DRUG SCREEN, HOSP PERFORMED - Abnormal; Notable for the following components:   Amphetamines POSITIVE (*)    All other components within normal limits  ETHANOL  CBC    EKG: None  Radiology: No results found.   Procedures   Medications Ordered in the ED - No data to display  Clinical Course as of 12/18/23 2313  Sat Dec 18, 2023  0606 Continues to be resting comfortably.  Is awake and alert.  Cooperative.  No evidence of acute psychosis. [CH]    Clinical Course User Index [CH] Whittany Parish, Charmaine FALCON, MD                                 Medical Decision Making Amount and/or Complexity of Data Reviewed Labs: ordered.   This patient presents to the ED for concern of psychosis, this involves an extensive number of treatment options, and is a complaint that carries with it a high risk of complications and morbidity.  I considered the following differential and admission for this acute, potentially life threatening condition.  The differential diagnosis includes acute psychosis, drug-induced psychosis, psychological  disorder  MDM:    This is a 53 year old female who presents with hallucinations and delusions.  On my evaluation she is cooperative and resting comfortably.  She has no complaints except for some chronic back pain.  She denies SI or HI.  Does not appear to be a harm to herself or others.  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  She is positive for amphetamines.  IVC was reviewed but was rescinded.  Patient was allowed to sleep and had no further display of hallucinations or delusions.  On recheck she is resting comfortably and wishes to be discharged home.  I reviewed her prior behavioral health evaluations  in similar situations where she has been cleared.  (Labs, imaging, consults)  Labs: I Ordered, and personally interpreted labs.  The pertinent results include: CBC CMP, UDS, EtOH  Imaging Studies ordered: I ordered imaging studies including N/A I independently visualized and interpreted imaging. I agree with the radiologist interpretation  Additional history obtained from chart review.  External records from outside source obtained and reviewed including behavioral health notes  Cardiac Monitoring: The patient was maintained on a cardiac monitor.  If on the cardiac monitor, I personally viewed and interpreted the cardiac monitored which showed an underlying rhythm of: Normal sinus  Reevaluation: After the interventions noted above, I reevaluated the patient and found that they have :improved  Social Determinants of Health:  drug abuse  Disposition: Discharge  Co morbidities that complicate the patient evaluation  Past Medical History:  Diagnosis Date   Anxiety    Chronic back pain    Depression    Endometriosis    Hepatitis C    Hyperthyroidism      Medicines No orders of the defined types were placed in this encounter.   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

## 2023-12-18 NOTE — ED Notes (Signed)
 IVC rescinded, envelope [id number]. Pt no longer ivc'd.

## 2023-12-18 NOTE — Discharge Instructions (Addendum)
 Evaluation today is overall reassuring.  Please follow-up with your PCP for your ongoing chronic back pain and medication refills.

## 2023-12-18 NOTE — Discharge Instructions (Signed)
 You were seen today after an episode of psychosis.  This is likely related to ongoing methamphetamine use.  You should avoid this.  Follow-up with behavioral health urgent care for further resources.
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
## 2023-12-31 NOTE — Progress Notes (Signed)
 Pt attended 10/27/2023 screening event with BP of 77/63. Pt noted at event that she does have a PCP. At event pt did indicate food & housing SDOH needs. Pt also noted that she is a smoker and listed Medicaid as her insurance at the event.   Per initial f/u pt was reached out via phone and call could not be completed, no vm could be left. Pt was mailed letter with food & housing resources, Get Care Now flyer, multiple community primary care clinic flyer, & smoking cessation flyer.  Per chart review pt does have a PCP (Mohammad Garba - no [facility] visible visits), insurance, and is a smoker. Pt does indicate food & housing SDOH needs at this time.  Additional pt f/u to be scheduled at this time per health equity protocol.

## 2024-01-16 ENCOUNTER — Other Ambulatory Visit: Payer: Self-pay

## 2024-01-16 ENCOUNTER — Encounter (HOSPITAL_COMMUNITY): Payer: Self-pay

## 2024-01-16 ENCOUNTER — Emergency Department (HOSPITAL_COMMUNITY)
Admission: EM | Admit: 2024-01-16 | Discharge: 2024-01-16 | Disposition: A | Discharge status: Home / Self Care | Payer: MEDICAID | Attending: Emergency Medicine | Admitting: Emergency Medicine

## 2024-01-16 DIAGNOSIS — R441 Visual hallucinations: Secondary | ICD-10-CM | POA: Insufficient documentation

## 2024-01-16 DIAGNOSIS — E039 Hypothyroidism, unspecified: Secondary | ICD-10-CM | POA: Diagnosis not present

## 2024-01-16 DIAGNOSIS — F1520 Other stimulant dependence, uncomplicated: Secondary | ICD-10-CM | POA: Diagnosis not present

## 2024-01-16 DIAGNOSIS — F1590 Other stimulant use, unspecified, uncomplicated: Secondary | ICD-10-CM

## 2024-01-16 DIAGNOSIS — Z765 Malingerer [conscious simulation]: Secondary | ICD-10-CM | POA: Diagnosis not present

## 2024-01-16 DIAGNOSIS — F418 Other specified anxiety disorders: Secondary | ICD-10-CM | POA: Insufficient documentation

## 2024-01-16 DIAGNOSIS — F419 Anxiety disorder, unspecified: Secondary | ICD-10-CM | POA: Diagnosis not present

## 2024-01-16 DIAGNOSIS — Z5900 Homelessness unspecified: Secondary | ICD-10-CM | POA: Diagnosis not present

## 2024-01-16 DIAGNOSIS — R443 Hallucinations, unspecified: Secondary | ICD-10-CM

## 2024-01-16 LAB — CBC
HCT: 43.7 % (ref 36.0–46.0)
Hemoglobin: 14.4 g/dL (ref 12.0–15.0)
MCH: 30.3 pg (ref 26.0–34.0)
MCHC: 33 g/dL (ref 30.0–36.0)
MCV: 91.8 fL (ref 80.0–100.0)
Platelets: 280 K/uL (ref 150–400)
RBC: 4.76 MIL/uL (ref 3.87–5.11)
RDW: 11.9 % (ref 11.5–15.5)
WBC: 9.1 K/uL (ref 4.0–10.5)
nRBC: 0 % (ref 0.0–0.2)

## 2024-01-16 LAB — COMPREHENSIVE METABOLIC PANEL WITH GFR
ALT: 15 U/L (ref 0–44)
AST: 25 U/L (ref 15–41)
Albumin: 4.1 g/dL (ref 3.5–5.0)
Alkaline Phosphatase: 88 U/L (ref 38–126)
Anion gap: 15 (ref 5–15)
BUN: 15 mg/dL (ref 6–20)
CO2: 27 mmol/L (ref 22–32)
Calcium: 9.6 mg/dL (ref 8.9–10.3)
Chloride: 99 mmol/L (ref 98–111)
Creatinine, Ser: 0.76 mg/dL (ref 0.44–1.00)
GFR, Estimated: >60 mL/min (ref >60)
Glucose, Bld: 99 mg/dL (ref 70–99)
Potassium: 4.5 mmol/L (ref 3.5–5.1)
Sodium: 141 mmol/L (ref 135–145)
Total Bilirubin: 0.6 mg/dL (ref 0.0–1.2)
Total Protein: 7.7 g/dL (ref 6.5–8.1)

## 2024-01-16 LAB — TSH: TSH: 1.406 u[IU]/mL (ref 0.350–4.500)

## 2024-01-16 LAB — ETHANOL: Alcohol, Ethyl (B): <15 mg/dL (ref <15)

## 2024-01-16 MED ORDER — TRAZODONE HCL 50 MG PO TABS: 50.0000 mg | ORAL_TABLET | Freq: Every evening | ORAL | Status: DC | PRN

## 2024-01-16 MED ORDER — PANTOPRAZOLE SODIUM 40 MG PO TBEC
40.0000 mg | DELAYED_RELEASE_TABLET | Freq: Every day | ORAL | Status: DC
  Administered 2024-01-16: 40 mg via ORAL
  Filled 2024-01-16: qty 1

## 2024-01-16 MED ORDER — BUPRENORPHINE HCL-NALOXONE HCL 8-2 MG SL SUBL
1.0000 | SUBLINGUAL_TABLET | Freq: Every day | SUBLINGUAL | Status: DC
  Administered 2024-01-16: 1 via SUBLINGUAL
  Filled 2024-01-16: qty 1

## 2024-01-16 MED ORDER — DOCUSATE SODIUM 100 MG PO CAPS
100.0000 mg | ORAL_CAPSULE | Freq: Two times a day (BID) | ORAL | Status: DC
  Administered 2024-01-16: 100 mg via ORAL
  Filled 2024-01-16: qty 1

## 2024-01-16 MED ORDER — LORATADINE 10 MG PO TABS
10.0000 mg | ORAL_TABLET | Freq: Every day | ORAL | Status: DC
Start: 2024-01-16 — End: 2024-01-16
  Administered 2024-01-16: 10 mg via ORAL
  Filled 2024-01-16: qty 1

## 2024-01-16 MED ORDER — NICOTINE POLACRILEX 2 MG MT GUM: 2.0000 mg | CHEWING_GUM | OROMUCOSAL | Status: DC | PRN

## 2024-01-16 MED ORDER — NICOTINE 21 MG/24HR TD PT24
21.0000 mg | MEDICATED_PATCH | Freq: Every day | TRANSDERMAL | Status: DC
Start: 2024-01-16 — End: 2024-01-16
  Administered 2024-01-16: 21 mg via TRANSDERMAL
  Filled 2024-01-16: qty 1

## 2024-01-16 MED ORDER — POLYETHYLENE GLYCOL 3350 17 G PO PACK: 17.0000 g | PACK | Freq: Two times a day (BID) | ORAL | Status: DC | PRN

## 2024-01-16 MED ORDER — ONDANSETRON HCL 4 MG PO TABS: 4.0000 mg | ORAL_TABLET | Freq: Three times a day (TID) | ORAL | Status: DC | PRN
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
## 2024-01-16 NOTE — ED Triage Notes (Signed)
 Pt reports increased depression, anxiety and VH for months now. Pt has been out of her meds. Denies SI/HI

## 2024-01-16 NOTE — ED Notes (Signed)
 Pt unable to provide urine sample at this time

## 2024-01-16 NOTE — ED Notes (Signed)
 Patient seen walking out of ER and left bags behind. Patient ambulatory with steady gait and unlabored breathing. Provider notified. PA informed this RN that patient is free to leave ER with denying SI/HI.

## 2024-01-16 NOTE — ED Notes (Signed)
 Patient transported to MRI

## 2024-01-16 NOTE — Discharge Instructions (Signed)
 Please perform close outpatient follow-up with your outpatient psychiatric primary care provider team Please continue current outpatient psychiatric medication regimen Please consider close follow-up with shelter resources given today upon discharge Please consider more intensive substance abuse treatment, if/when amenable Please strictly here to safety return precautions discussed today

## 2024-01-16 NOTE — ED Notes (Signed)
 RN attempted to collect blood and urine from the patient. Patient stated that she is unable to urinate at this time. Patient refused to allow RN to draw blood at this time until she drinks more fluids. Patient informed that she would return shortly.

## 2024-01-16 NOTE — ED Notes (Signed)
 Patient did come back to the ER for her bags and talked with the provider and decided to not stay in the ER for treatment. Patient continued to deny SI/HI. Patient reported that she will follow up and return to the ER if needed. Patient seen leaving the ER with regular/unlabored breathing, and steady gait.

## 2024-01-16 NOTE — ED Provider Notes (Signed)
 [place] EMERGENCY DEPARTMENT AT [facility] Provider Note   CSN: [id number] Arrival date & time: 01/16/24  9261     Patient presents with: Depression and Hallucinations   Kristina Hardy is a 53 y.o. female.    Depression   Patient is a 53 year old female with past medical history significant for hepatitis, endometriosis, depression, anxiety, hypothyroidism, chronic back pain   She has had increasingly worsening visual hallucinations including demons/devils that talk to her the send going on for approximately 1 year but have become quite bad.  She is not suicidal or homicidal but states that she feels that her mental condition is worsening.  Seems that she has been on Depakote in the past which she feels like helps her symptoms but she was lost to follow-up or for some other reason has not been able to obtain this medication.     Prior to Admission medications   Medication Sig Start Date End Date Taking? Authorizing Provider  Buprenorphine  HCl-Naloxone  HCl 8-2 MG FILM Place 1 Film under the tongue daily.    [provider]  docusate sodium  (COLACE) 100 MG capsule Take 1 capsule (100 mg total) by mouth 2 (two) times daily. 06/23/23   Mayers, Cari S, PA-C  hydrocortisone  (ANUSOL -HC) 25 MG suppository Place 1 suppository (25 mg total) rectally 2 (two) times daily. 06/23/23   Mayers, Cari S, PA-C  ibuprofen  (ADVIL ) 200 MG tablet Take 400-800 mg by mouth 2 (two) times daily as needed for moderate pain. Patient not taking: Reported on 03/16/2023    [provider]  loratadine  (CLARITIN ) 10 MG tablet Take 1 tablet (10 mg total) by mouth daily. Patient not taking: Reported on 03/16/2023 01/25/23   Juleen Reggie NOVAK, MD  methylPREDNISolone  (MEDROL  DOSEPAK) 4 MG TBPK tablet Take as instructed by dose packaging 10/08/23   Mannie Pac T, DO  nicotine  (NICODERM CQ  - DOSED IN MG/24 HOURS) 21 mg/24hr patch Place 1 patch (21 mg total) onto the skin daily. Patient not  taking: Reported on 03/16/2023 01/25/23   McQuilla, Jai B, MD  nicotine  polacrilex (NICORETTE ) 2 MG gum Take 1 each (2 mg total) by mouth as needed for smoking cessation. Patient not taking: Reported on 03/16/2023 01/24/23   Juleen Reggie NOVAK, MD  OLANZapine  (ZYPREXA ) 5 MG tablet Take 1 tablet (5 mg total) by mouth now for 1 dose. 06/20/23 06/20/23  Mardy Elveria DEL, NP  omeprazole  (PRILOSEC) 20 MG capsule Take 1 capsule (20 mg total) by mouth daily. Patient not taking: Reported on 01/15/2023 01/07/23   Roemhildt, Lorin T, PA-C  ondansetron  (ZOFRAN ) 4 MG tablet Take 1 tablet (4 mg total) by mouth every 6 (six) hours. 04/12/23   Bernis Ernst, PA-C  polyethylene glycol powder (GLYCOLAX /MIRALAX ) 17 GM/SCOOP powder Take 17 g by mouth 2 (two) times daily as needed. 06/23/23   Mayers, Cari S, PA-C  traZODone  (DESYREL ) 50 MG tablet Take 1 tablet (50 mg total) by mouth at bedtime as needed for sleep. Patient not taking: Reported on 03/16/2023 01/24/23   Juleen Reggie NOVAK, MD    Allergies: Nsaids, Robaxin  [methocarbamol ], and Vicodin Dolores.[place] ]    Review of Systems  Psychiatric/Behavioral:  Positive for depression.     Updated Vital Signs BP 117/88 (BP Location: Right Arm)   Pulse 88   Temp 97.6 F (36.4 C)   Resp 17   Ht 5' 2 (1.575 m)   Wt 43.1 kg   LMP 11/17/2011   SpO2 100%   BMI 17.38 kg/m  Physical Exam Vitals and nursing note reviewed.  Constitutional:      General: She is not in acute distress. HENT:     Head: Normocephalic and atraumatic.     Nose: Nose normal.  Eyes:     General: No scleral icterus. Cardiovascular:     Rate and Rhythm: Normal rate and regular rhythm.     Pulses: Normal pulses.     Heart sounds: Normal heart sounds.  Pulmonary:     Effort: Pulmonary effort is normal. No respiratory distress.     Breath sounds: No wheezing.  Abdominal:     Palpations: Abdomen is soft.     Tenderness: There is no abdominal tenderness.  Musculoskeletal:      Cervical back: Normal range of motion.     Right lower leg: No edema.     Left lower leg: No edema.  Skin:    General: Skin is warm and dry.     Capillary Refill: Capillary refill takes less than 2 seconds.  Neurological:     Mental Status: She is alert. Mental status is at baseline.  Psychiatric:     Comments: Anxious     (all labs ordered are listed, but only abnormal results are displayed) Labs Reviewed  COMPREHENSIVE METABOLIC PANEL WITH GFR  ETHANOL  CBC  RAPID URINE DRUG SCREEN, HOSP PERFORMED    EKG: None  Radiology: No results found.   Procedures   Medications Ordered in the ED  ondansetron  (ZOFRAN ) tablet 4 mg (has no administration in time range)  nicotine  (NICODERM CQ  - dosed in mg/24 hours) patch 21 mg (has no administration in time range)  nicotine  polacrilex (NICORETTE ) gum 2 mg (has no administration in time range)  traZODone  (DESYREL ) tablet 50 mg (has no administration in time range)  loratadine  (CLARITIN ) tablet 10 mg (has no administration in time range)  polyethylene glycol powder (GLYCOLAX /MIRALAX ) container 17 g (has no administration in time range)  docusate sodium  (COLACE) capsule 100 mg (has no administration in time range)  pantoprazole  (PROTONIX ) EC tablet 40 mg (has no administration in time range)  buprenorphine -naloxone  (SUBOXONE ) 8-2 mg per SL tablet 1 tablet (has no administration in time range)                                    Medical Decision Making Amount and/or Complexity of Data Reviewed Labs: ordered.  Risk OTC drugs. Prescription drug management.   Patient is a 53 year old female with past medical history significant for hepatitis, endometriosis, depression, anxiety, hypothyroidism, chronic back pain   She has had increasingly worsening visual hallucinations including demons/devil's that talk to her the send going on for approximately 1 year but have become quite bad.  She is not suicidal or homicidal but states that  she feels that her mental condition is worsening.  Seems that she has been on Depakote in the past which she feels like helps her symptoms but she was lost to follow-up or for some other reason has not been able to obtain this medication.  Labs: unremarkable   Patient medically cleared awaiting TTS consultation.  Diet and home meds ordered.  Consultation with psychiatry has been requested for consideration of medications to help with this patient's hallucinations.  Patient does have a history of methamphetamine use - seems to have had multiple visits in the past for similar symptoms. I discussed with Jerel, psych NP who has evaluated the patient.  Please see his note.  Patient discharged home   Final diagnoses:  Hallucinations  Methamphetamine use    ED Discharge Orders     None          Neldon Hamp RAMAN, [place] 01/16/24 1600    Tegeler, Lonni PARAS, MD 01/17/24 1555

## 2024-01-29 ENCOUNTER — Emergency Department (HOSPITAL_COMMUNITY)
Admission: EM | Admit: 2024-01-29 | Discharge: 2024-01-29 | Disposition: A | Discharge status: Home / Self Care | Payer: MEDICAID | Attending: Emergency Medicine | Admitting: Emergency Medicine

## 2024-01-29 ENCOUNTER — Emergency Department (HOSPITAL_COMMUNITY): Payer: MEDICAID

## 2024-01-29 ENCOUNTER — Encounter (HOSPITAL_COMMUNITY): Payer: Self-pay

## 2024-01-29 ENCOUNTER — Other Ambulatory Visit: Payer: Self-pay

## 2024-01-29 DIAGNOSIS — Z5900 Homelessness unspecified: Secondary | ICD-10-CM | POA: Diagnosis not present

## 2024-01-29 DIAGNOSIS — R1031 Right lower quadrant pain: Secondary | ICD-10-CM | POA: Insufficient documentation

## 2024-01-29 DIAGNOSIS — M542 Cervicalgia: Secondary | ICD-10-CM | POA: Insufficient documentation

## 2024-01-29 DIAGNOSIS — E876 Hypokalemia: Secondary | ICD-10-CM | POA: Diagnosis not present

## 2024-01-29 DIAGNOSIS — R109 Unspecified abdominal pain: Secondary | ICD-10-CM

## 2024-01-29 LAB — COMPREHENSIVE METABOLIC PANEL WITH GFR
ALT: 14 U/L (ref 0–44)
AST: 21 U/L (ref 15–41)
Albumin: 3.4 g/dL — ABNORMAL LOW (ref 3.5–5.0)
Alkaline Phosphatase: 66 U/L (ref 38–126)
Anion gap: 10 (ref 5–15)
BUN: 13 mg/dL (ref 6–20)
CO2: 24 mmol/L (ref 22–32)
Calcium: 8.6 mg/dL — ABNORMAL LOW (ref 8.9–10.3)
Chloride: 99 mmol/L (ref 98–111)
Creatinine, Ser: 0.71 mg/dL (ref 0.44–1.00)
GFR, Estimated: >60 mL/min (ref >60)
Glucose, Bld: 81 mg/dL (ref 70–99)
Potassium: 3.2 mmol/L — ABNORMAL LOW (ref 3.5–5.1)
Sodium: 133 mmol/L — ABNORMAL LOW (ref 135–145)
Total Bilirubin: 0.7 mg/dL (ref 0.0–1.2)
Total Protein: 6 g/dL — ABNORMAL LOW (ref 6.5–8.1)

## 2024-01-29 LAB — CBC WITH DIFFERENTIAL/PLATELET
Abs Immature Granulocytes: 0.02 K/uL (ref 0.00–0.07)
Basophils Absolute: 0 K/uL (ref 0.0–0.1)
Basophils Relative: 1 %
Eosinophils Absolute: 0.1 K/uL (ref 0.0–0.5)
Eosinophils Relative: 1 %
HCT: 32.8 % — ABNORMAL LOW (ref 36.0–46.0)
Hemoglobin: 11.1 g/dL — ABNORMAL LOW (ref 12.0–15.0)
Immature Granulocytes: 0 %
Lymphocytes Relative: 32 %
Lymphs Abs: 2.4 K/uL (ref 0.7–4.0)
MCH: 30.6 pg (ref 26.0–34.0)
MCHC: 33.8 g/dL (ref 30.0–36.0)
MCV: 90.4 fL (ref 80.0–100.0)
Monocytes Absolute: 0.5 K/uL (ref 0.1–1.0)
Monocytes Relative: 7 %
Neutro Abs: 4.3 K/uL (ref 1.7–7.7)
Neutrophils Relative %: 59 %
Platelets: 218 K/uL (ref 150–400)
RBC: 3.63 MIL/uL — ABNORMAL LOW (ref 3.87–5.11)
RDW: 11.8 % (ref 11.5–15.5)
WBC: 7.4 K/uL (ref 4.0–10.5)
nRBC: 0 % (ref 0.0–0.2)

## 2024-01-29 MED ORDER — POTASSIUM CHLORIDE CRYS ER 20 MEQ PO TBCR
40.0000 meq | EXTENDED_RELEASE_TABLET | Freq: Once | ORAL | Status: AC
  Administered 2024-01-29: 40 meq via ORAL
  Filled 2024-01-29: qty 2
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
## 2024-01-29 NOTE — ED Notes (Signed)
 Extra Blue, Red,SST & DG tube drawn

## 2024-01-29 NOTE — Discharge Instructions (Addendum)
 It was a pleasure taking care of you today. You were seen in the Emergency Department for back and abdominal pain. Your work-up was reassuring. Your labs show no acute abnormality.  If your neck continues to be sore, I recommend applying heat and performing gentle stretches.  Refer to the attached documentation for further management of your symptoms. Follow up with PCP if your symptoms continue.  Please return to the ER if you experience chest pain, trouble breathing, intractable nausea/vomiting or any other life threatening illnesses.
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
## 2024-01-31 ENCOUNTER — Emergency Department (HOSPITAL_COMMUNITY): Payer: MEDICAID

## 2024-01-31 ENCOUNTER — Inpatient Hospital Stay (HOSPITAL_COMMUNITY)
Admission: AD | Admit: 2024-01-31 | Discharge: 2024-02-06 | DRG: 885 | Disposition: A | Discharge status: Home / Self Care | Payer: MEDICAID | Source: Intra-hospital

## 2024-01-31 ENCOUNTER — Encounter (HOSPITAL_COMMUNITY): Payer: Self-pay | Admitting: Psychiatry

## 2024-01-31 ENCOUNTER — Encounter (HOSPITAL_COMMUNITY): Payer: Self-pay

## 2024-01-31 ENCOUNTER — Emergency Department (HOSPITAL_COMMUNITY)
Admission: EM | Admit: 2024-01-31 | Discharge: 2024-01-31 | Disposition: A | Discharge status: Other Acute Inpatient Hospital | Payer: MEDICAID | Attending: Emergency Medicine | Admitting: Emergency Medicine

## 2024-01-31 ENCOUNTER — Other Ambulatory Visit: Payer: Self-pay

## 2024-01-31 DIAGNOSIS — F1111 Opioid abuse, in remission: Secondary | ICD-10-CM | POA: Diagnosis present

## 2024-01-31 DIAGNOSIS — D649 Anemia, unspecified: Secondary | ICD-10-CM | POA: Insufficient documentation

## 2024-01-31 DIAGNOSIS — F419 Anxiety disorder, unspecified: Secondary | ICD-10-CM | POA: Diagnosis present

## 2024-01-31 DIAGNOSIS — F411 Generalized anxiety disorder: Secondary | ICD-10-CM | POA: Diagnosis present

## 2024-01-31 DIAGNOSIS — Z5941 Food insecurity: Secondary | ICD-10-CM | POA: Diagnosis not present

## 2024-01-31 DIAGNOSIS — E039 Hypothyroidism, unspecified: Secondary | ICD-10-CM | POA: Diagnosis not present

## 2024-01-31 DIAGNOSIS — Z630 Problems in relationship with spouse or partner: Secondary | ICD-10-CM | POA: Diagnosis not present

## 2024-01-31 DIAGNOSIS — F332 Major depressive disorder, recurrent severe without psychotic features: Secondary | ICD-10-CM | POA: Diagnosis present

## 2024-01-31 DIAGNOSIS — R1030 Lower abdominal pain, unspecified: Secondary | ICD-10-CM | POA: Insufficient documentation

## 2024-01-31 DIAGNOSIS — Z23 Encounter for immunization: Secondary | ICD-10-CM | POA: Diagnosis not present

## 2024-01-31 DIAGNOSIS — Z888 Allergy status to other drugs, medicaments and biological substances status: Secondary | ICD-10-CM

## 2024-01-31 DIAGNOSIS — K219 Gastro-esophageal reflux disease without esophagitis: Secondary | ICD-10-CM | POA: Diagnosis present

## 2024-01-31 DIAGNOSIS — Z560 Unemployment, unspecified: Secondary | ICD-10-CM | POA: Diagnosis not present

## 2024-01-31 DIAGNOSIS — R45851 Suicidal ideations: Secondary | ICD-10-CM | POA: Diagnosis present

## 2024-01-31 DIAGNOSIS — F15959 Other stimulant use, unspecified with stimulant-induced psychotic disorder, unspecified: Secondary | ICD-10-CM

## 2024-01-31 DIAGNOSIS — F1191 Opioid use, unspecified, in remission: Secondary | ICD-10-CM

## 2024-01-31 DIAGNOSIS — F19959 Other psychoactive substance use, unspecified with psychoactive substance-induced psychotic disorder, unspecified: Secondary | ICD-10-CM | POA: Insufficient documentation

## 2024-01-31 DIAGNOSIS — F1520 Other stimulant dependence, uncomplicated: Secondary | ICD-10-CM | POA: Diagnosis not present

## 2024-01-31 DIAGNOSIS — F32A Depression, unspecified: Secondary | ICD-10-CM | POA: Diagnosis not present

## 2024-01-31 DIAGNOSIS — F329 Major depressive disorder, single episode, unspecified: Secondary | ICD-10-CM

## 2024-01-31 DIAGNOSIS — Z79899 Other long term (current) drug therapy: Secondary | ICD-10-CM

## 2024-01-31 DIAGNOSIS — Z886 Allergy status to analgesic agent status: Secondary | ICD-10-CM

## 2024-01-31 DIAGNOSIS — R441 Visual hallucinations: Secondary | ICD-10-CM | POA: Insufficient documentation

## 2024-01-31 DIAGNOSIS — G8929 Other chronic pain: Secondary | ICD-10-CM | POA: Diagnosis present

## 2024-01-31 DIAGNOSIS — F15229 Other stimulant dependence with intoxication, unspecified: Secondary | ICD-10-CM | POA: Diagnosis present

## 2024-01-31 DIAGNOSIS — M549 Dorsalgia, unspecified: Secondary | ICD-10-CM | POA: Diagnosis present

## 2024-01-31 DIAGNOSIS — R443 Hallucinations, unspecified: Secondary | ICD-10-CM

## 2024-01-31 DIAGNOSIS — Z5900 Homelessness unspecified: Secondary | ICD-10-CM

## 2024-01-31 DIAGNOSIS — F17210 Nicotine dependence, cigarettes, uncomplicated: Secondary | ICD-10-CM | POA: Diagnosis present

## 2024-01-31 DIAGNOSIS — Z638 Other specified problems related to primary support group: Secondary | ICD-10-CM | POA: Diagnosis not present

## 2024-01-31 DIAGNOSIS — Z885 Allergy status to narcotic agent status: Secondary | ICD-10-CM | POA: Diagnosis not present

## 2024-01-31 DIAGNOSIS — F333 Major depressive disorder, recurrent, severe with psychotic symptoms: Principal | ICD-10-CM | POA: Diagnosis present

## 2024-01-31 LAB — URINALYSIS, ROUTINE W REFLEX MICROSCOPIC
Bacteria, UA: NONE SEEN
Bilirubin Urine: NEGATIVE
Glucose, UA: NEGATIVE mg/dL
Ketones, ur: NEGATIVE mg/dL
Nitrite: NEGATIVE
Protein, ur: NEGATIVE mg/dL
Specific Gravity, Urine: 1.014 (ref 1.005–1.030)
pH: 6 (ref 5.0–8.0)

## 2024-01-31 LAB — COMPREHENSIVE METABOLIC PANEL WITH GFR
ALT: 11 U/L (ref 0–44)
AST: 25 U/L (ref 15–41)
Albumin: 4.2 g/dL (ref 3.5–5.0)
Alkaline Phosphatase: 87 U/L (ref 38–126)
Anion gap: 11 (ref 5–15)
BUN: 14 mg/dL (ref 6–20)
CO2: 27 mmol/L (ref 22–32)
Calcium: 9.5 mg/dL (ref 8.9–10.3)
Chloride: 103 mmol/L (ref 98–111)
Creatinine, Ser: 0.82 mg/dL (ref 0.44–1.00)
GFR, Estimated: >60 mL/min (ref >60)
Glucose, Bld: 98 mg/dL (ref 70–99)
Potassium: 3.8 mmol/L (ref 3.5–5.1)
Sodium: 140 mmol/L (ref 135–145)
Total Bilirubin: 0.4 mg/dL (ref 0.0–1.2)
Total Protein: 6.8 g/dL (ref 6.5–8.1)

## 2024-01-31 LAB — CBC WITH DIFFERENTIAL/PLATELET
Abs Immature Granulocytes: 0.02 K/uL (ref 0.00–0.07)
Basophils Absolute: 0 K/uL (ref 0.0–0.1)
Basophils Relative: 0 %
Eosinophils Absolute: 0.1 K/uL (ref 0.0–0.5)
Eosinophils Relative: 1 %
HCT: 33.6 % — ABNORMAL LOW (ref 36.0–46.0)
Hemoglobin: 11 g/dL — ABNORMAL LOW (ref 12.0–15.0)
Immature Granulocytes: 0 %
Lymphocytes Relative: 32 %
Lymphs Abs: 2.7 K/uL (ref 0.7–4.0)
MCH: 30.2 pg (ref 26.0–34.0)
MCHC: 32.7 g/dL (ref 30.0–36.0)
MCV: 92.3 fL (ref 80.0–100.0)
Monocytes Absolute: 0.5 K/uL (ref 0.1–1.0)
Monocytes Relative: 6 %
Neutro Abs: 5.1 K/uL (ref 1.7–7.7)
Neutrophils Relative %: 61 %
Platelets: 223 K/uL (ref 150–400)
RBC: 3.64 MIL/uL — ABNORMAL LOW (ref 3.87–5.11)
RDW: 11.8 % (ref 11.5–15.5)
WBC: 8.4 K/uL (ref 4.0–10.5)
nRBC: 0 % (ref 0.0–0.2)

## 2024-01-31 LAB — URINE DRUG SCREEN
Amphetamines: POSITIVE — AB
Barbiturates: NEGATIVE
Benzodiazepines: NEGATIVE
Cocaine: NEGATIVE
Fentanyl: NEGATIVE
Methadone Scn, Ur: NEGATIVE
Opiates: NEGATIVE
Tetrahydrocannabinol: NEGATIVE

## 2024-01-31 LAB — ETHANOL: Alcohol, Ethyl (B): <15 mg/dL (ref <15)

## 2024-01-31 LAB — SALICYLATE LEVEL: Salicylate Lvl: <7 mg/dL — ABNORMAL LOW (ref 7.0–30.0)

## 2024-01-31 LAB — LIPASE, BLOOD: Lipase: 24 U/L (ref 11–51)

## 2024-01-31 LAB — ACETAMINOPHEN LEVEL: Acetaminophen (Tylenol), Serum: <10 ug/mL — ABNORMAL LOW (ref 10–30)

## 2024-01-31 MED ORDER — NICOTINE POLACRILEX 2 MG MT GUM
2.0000 mg | CHEWING_GUM | OROMUCOSAL | Status: DC | PRN
  Administered 2024-02-01 – 2024-02-04 (×4): 2 mg via ORAL
  Filled 2024-01-31: qty 1

## 2024-01-31 MED ORDER — DIPHENHYDRAMINE HCL 50 MG/ML IJ SOLN: 50.0000 mg | Freq: Three times a day (TID) | INTRAMUSCULAR | Status: DC | PRN

## 2024-01-31 MED ORDER — NICOTINE 21 MG/24HR TD PT24
21.0000 mg | MEDICATED_PATCH | Freq: Every day | TRANSDERMAL | Status: DC
  Administered 2024-02-01 – 2024-02-05 (×5): 21 mg via TRANSDERMAL
  Filled 2024-01-31 (×5): qty 1

## 2024-01-31 MED ORDER — BUPRENORPHINE HCL-NALOXONE HCL 8-2 MG SL SUBL
1.0000 | SUBLINGUAL_TABLET | Freq: Every day | SUBLINGUAL | Status: DC
  Administered 2024-02-01: 1 via SUBLINGUAL
  Filled 2024-01-31: qty 1

## 2024-01-31 MED ORDER — HALOPERIDOL LACTATE 5 MG/ML IJ SOLN: 5.0000 mg | Freq: Three times a day (TID) | INTRAMUSCULAR | Status: DC | PRN

## 2024-01-31 MED ORDER — ONDANSETRON HCL 4 MG PO TABS: 4.0000 mg | ORAL_TABLET | Freq: Three times a day (TID) | ORAL | Status: DC | PRN

## 2024-01-31 MED ORDER — ACETAMINOPHEN 325 MG PO TABS: 650.0000 mg | ORAL_TABLET | ORAL | Status: DC | PRN

## 2024-01-31 MED ORDER — ALUM & MAG HYDROXIDE-SIMETH 200-200-20 MG/5ML PO SUSP: 30.0000 mL | ORAL | Status: DC | PRN

## 2024-01-31 MED ORDER — HALOPERIDOL 5 MG PO TABS: 5.0000 mg | ORAL_TABLET | Freq: Three times a day (TID) | ORAL | Status: DC | PRN

## 2024-01-31 MED ORDER — NICOTINE 14 MG/24HR TD PT24: 14.0000 mg | MEDICATED_PATCH | Freq: Every day | TRANSDERMAL | Status: DC

## 2024-01-31 MED ORDER — IOHEXOL 300 MG/ML  SOLN
100.0000 mL | Freq: Once | INTRAMUSCULAR | Status: AC | PRN
  Administered 2024-01-31: 100 mL via INTRAVENOUS

## 2024-01-31 MED ORDER — LORAZEPAM 2 MG/ML IJ SOLN: 2.0000 mg | Freq: Three times a day (TID) | INTRAMUSCULAR | Status: DC | PRN

## 2024-01-31 MED ORDER — OLANZAPINE 5 MG PO TABS
5.0000 mg | ORAL_TABLET | ORAL | Status: AC
  Administered 2024-01-31: 5 mg via ORAL
  Filled 2024-01-31: qty 1

## 2024-01-31 MED ORDER — CLONAZEPAM 0.5 MG PO TABS
0.5000 mg | ORAL_TABLET | Freq: Once | ORAL | Status: AC
  Administered 2024-01-31: 0.5 mg via ORAL
  Filled 2024-01-31: qty 1

## 2024-01-31 MED ORDER — MAGNESIUM HYDROXIDE 400 MG/5ML PO SUSP: 30.0000 mL | Freq: Every day | ORAL | Status: DC | PRN

## 2024-01-31 MED ORDER — ALUM & MAG HYDROXIDE-SIMETH 200-200-20 MG/5ML PO SUSP: 30.0000 mL | Freq: Four times a day (QID) | ORAL | Status: DC | PRN

## 2024-01-31 MED ORDER — DIPHENHYDRAMINE HCL 25 MG PO CAPS: 50.0000 mg | ORAL_CAPSULE | Freq: Three times a day (TID) | ORAL | Status: DC | PRN

## 2024-01-31 MED ORDER — BUPRENORPHINE HCL-NALOXONE HCL 8-2 MG SL SUBL: 1.0000 | SUBLINGUAL_TABLET | Freq: Every day | SUBLINGUAL | Status: DC

## 2024-01-31 MED ORDER — DOCUSATE SODIUM 100 MG PO CAPS: 100.0000 mg | ORAL_CAPSULE | Freq: Two times a day (BID) | ORAL | Status: DC

## 2024-01-31 MED ORDER — HALOPERIDOL LACTATE 5 MG/ML IJ SOLN: 10.0000 mg | Freq: Three times a day (TID) | INTRAMUSCULAR | Status: DC | PRN

## 2024-01-31 MED ORDER — PANTOPRAZOLE SODIUM 40 MG PO TBEC: 40.0000 mg | DELAYED_RELEASE_TABLET | Freq: Every day | ORAL | Status: DC

## 2024-01-31 MED ORDER — HYDROXYZINE HCL 25 MG PO TABS: 25.0000 mg | ORAL_TABLET | Freq: Three times a day (TID) | ORAL | Status: DC | PRN

## 2024-01-31 MED ORDER — INFLUENZA VIRUS VACC SPLIT PF (FLUZONE) 0.5 ML IM SUSY
0.5000 mL | PREFILLED_SYRINGE | INTRAMUSCULAR | Status: AC
Start: 2024-02-01 — End: 2024-02-03
  Administered 2024-02-03: 0.5 mL via INTRAMUSCULAR
  Filled 2024-01-31: qty 0.5

## 2024-01-31 NOTE — Plan of Care (Signed)
  Problem: Education: Goal: Knowledge of [place] [facility] Education information/materials will improve Outcome: Progressing   Problem: Safety: Goal: Periods of time without injury will increase Outcome: Progressing   Problem: Physical Regulation: Goal: Ability to maintain clinical measurements within normal limits will improve Outcome: Progressing

## 2024-01-31 NOTE — Progress Notes (Signed)
 Patient is a 53 year old female admitted to unit a a voluntary admission for suicidal ideation with no plan and visual hallucinations. Upon admission admission, pt denies current suicidal ideation, pt denies AVH. Pt noted to angry and irritable, speaking loudly at staff, demanding Suboxone  and Klonopin. Pt stated, my boyfriend broke up with me and I lost everybody in my life. Pt stated that she currently lives in a shelter. Skin and safety check competed. Skin intact, no contraband found. High fall risk identified due to unsteady gait on arrival.  Pt escorted on unit and orientation provided with verbalization of understanding. Safety maintained.  01/31/24 1400  Psych Admission Type (Psych Patients Only)  Admission Status Voluntary  Psychosocial Assessment  Patient Complaints Anxiety;Depression;Irritability;Substance abuse  Eye Contact Brief  Facial Expression Anxious;Angry  Affect Irritable;Flat  Speech Logical/coherent  Interaction Arrogant;Demanding  Motor Activity Slow  Appearance/Hygiene Unremarkable  Behavior Characteristics Cooperative;Agitated;Anxious;Irritable  Mood Anxious;Depressed;Angry;Irritable  Thought Process  Coherency WDL  Content WDL  Delusions None reported or observed  Perception WDL  Hallucination None reported or observed  Judgment WDL  Confusion None  Danger to Self  Current suicidal ideation? Denies  Danger to Others  Danger to Others None reported or observed

## 2024-01-31 NOTE — Group Note (Signed)
 Date:  01/31/2024 Time:  4:59 PM  Group Topic/Focus: Occupational Therapy   Pt did not attend occupational therapy group   Samwise Eckardt R Georg Ang 01/31/2024, 4:59 PM

## 2024-01-31 NOTE — ED Notes (Signed)
TTS consult in process. 
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
## 2024-01-31 NOTE — Group Note (Signed)
 Date:  01/31/2024 Time:  3:28 PM  Group Topic/Focus: Grief and Loss (Chaplain)   Pt did not attend chaplain group   Randal Yepiz R Tiffancy Moger 01/31/2024, 3:28 PM

## 2024-01-31 NOTE — Tx Team (Signed)
 Initial Treatment Plan 01/31/2024 2:56 PM Kristina Hardy [id number]    PATIENT STRESSORS: Financial difficulties   Marital or family conflict   Substance abuse     PATIENT STRENGTHS: Motivation for treatment/growth    PATIENT IDENTIFIED PROBLEMS:  I need my Suboxone                      DISCHARGE CRITERIA:  Ability to meet basic life and health needs Adequate post-discharge living arrangements Verbal commitment to aftercare and medication compliance  PRELIMINARY DISCHARGE PLAN: Outpatient therapy Placement in alternative living arrangements  PATIENT/FAMILY INVOLVEMENT: This treatment plan has been presented to and reviewed with the patient, Kristina Hardy. The patient has been given the opportunity to ask questions and make suggestions.  Jinnie LULLA Gails, RN 01/31/2024, 2:56 PM
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
## 2024-01-31 NOTE — Progress Notes (Signed)
 Pt has been accepted to [facility] on 01/31/2024  Bed assignment: 401-01  Pt meets inpatient criteria per: Cathaleen Jacobson NP  Attending Physician will be: Dr, Raliegh MD  Report can be called to: unit: Adult unit: [phone]  Pt can arrive after EKG [facility] WILL UPDATE   Care Team Notified: [facility] Danika Carlo RN, Cathaleen Mangrum NP  [place] Solomon Skowronek LCSW-A   01/31/2024 11:48 AM

## 2024-01-31 NOTE — ED Notes (Signed)
 Patient belongings placed in  cabinets near nurse station [id number]
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
## 2024-01-31 NOTE — ED Notes (Signed)
 Patient transported to CT

## 2024-01-31 NOTE — ED Notes (Signed)
 RN provided pt with burgundy scrubs, disposable underwear, and flip flops.  Receiving RN notified of pt chief complaint and statements of SI.  Pt is in room near nurses station and door is open.
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
## 2024-01-31 NOTE — ED Notes (Signed)
 Gave pt instructions regarding urine sample, pt said she is unable to void at this time

## 2024-01-31 NOTE — ED Notes (Signed)
 Voluntary consent signed 10/27 @ 1200 and placed in sports coach.

## 2024-02-01 DIAGNOSIS — F1191 Opioid use, unspecified, in remission: Secondary | ICD-10-CM

## 2024-02-01 MED ORDER — BUPRENORPHINE HCL-NALOXONE HCL 8-2 MG SL SUBL
1.0000 | SUBLINGUAL_TABLET | Freq: Two times a day (BID) | SUBLINGUAL | Status: DC
Start: 2024-02-01 — End: 2024-02-06
  Administered 2024-02-01 – 2024-02-06 (×9): 1 via SUBLINGUAL
  Filled 2024-02-01 (×9): qty 1

## 2024-02-01 MED ORDER — ONDANSETRON 4 MG PO TBDP: 4.0000 mg | ORAL_TABLET | Freq: Three times a day (TID) | ORAL | Status: DC | PRN

## 2024-02-01 MED ORDER — CLONAZEPAM 0.5 MG PO TABS
0.5000 mg | ORAL_TABLET | Freq: Two times a day (BID) | ORAL | Status: DC | PRN
  Administered 2024-02-01 – 2024-02-06 (×11): 0.5 mg via ORAL
  Filled 2024-02-01 (×12): qty 1

## 2024-02-01 MED ORDER — DIVALPROEX SODIUM 500 MG PO DR TAB
500.0000 mg | DELAYED_RELEASE_TABLET | Freq: Every day | ORAL | Status: DC
  Administered 2024-02-01 – 2024-02-02 (×2): 500 mg via ORAL
  Filled 2024-02-01 (×2): qty 1

## 2024-02-01 NOTE — Progress Notes (Signed)
   02/01/24 2200  Psych Admission Type (Psych Patients Only)  Admission Status Voluntary  Psychosocial Assessment  Patient Complaints Anxiety;Depression  Eye Contact Brief  Facial Expression Anxious  Affect Anxious  Speech Logical/coherent  Interaction Assertive  Motor Activity Slow  Appearance/Hygiene Disheveled  Behavior Characteristics Anxious;Guarded  Mood Anxious  Thought Process  Coherency WDL  Content WDL  Delusions None reported or observed  Perception WDL  Hallucination None reported or observed  Judgment Impaired  Confusion None  Danger to Self  Current suicidal ideation? Denies  Agreement Not to Harm Self Yes  Description of Agreement Verbal  Danger to Others  Danger to Others None reported or observed

## 2024-02-01 NOTE — Group Note (Signed)
 Recreation Therapy Group Note   Group Topic:Animal Assisted Therapy   Group Date: 02/01/2024 Start Time: 0946 End Time: 1030 Facilitators: Stevens Magwood-McCall, LRT,CTRS Location: [address] Dayroom   Animal-Assisted Activity (AAA) Program Checklist/Progress Notes Patient Eligibility Criteria Checklist & Daily Group note for Rec Tx Intervention  AAA/T Program Assumption of Risk Form signed by Patient/ or Parent Legal Guardian Yes  Patient is free of allergies or severe asthma Yes  Patient reports no fear of animals Yes  Patient reports no history of cruelty to animals Yes  Patient understands his/her participation is voluntary Yes  Patient washes hands before animal contact Yes  Patient washes hands after animal contact Yes  Behavioral Response:    Education: Hand Washing, Appropriate Animal Interaction   Education Outcome: Acknowledges education.    Affect/Mood: N/A   Participation Level: Did not attend    Clinical Observations/Individualized Feedback:      Plan: Continue to engage patient in RT group sessions 2-3x/week.   Kristina Hardy, LRT,CTRS 02/01/2024 12:36 PM

## 2024-02-01 NOTE — Plan of Care (Signed)
  Problem: Education: Goal: Emotional status will improve Outcome: Not Met (add Reason) Goal: Mental status will improve Outcome: Not Met (add Reason)   Problem: Coping: Goal: Ability to verbalize frustrations and anger appropriately will improve Outcome: Not Met (add Reason)   Problem: Safety: Goal: Periods of time without injury will increase Outcome: Progressing

## 2024-02-01 NOTE — Group Note (Signed)
 Date:  02/01/2024 Time:  10:16 AM  Group Topic/Focus:  Goals Group:   The focus of this group is to help patients establish daily goals to achieve during treatment and discuss how the patient can incorporate goal setting into their daily lives to aide in recovery. Orientation:   The focus of this group is to educate the patient on the purpose and policies of crisis stabilization and provide a format to answer questions about their admission.  The group details unit policies and expectations of patients while admitted.    Participation Level:  Did Not Attend   Kristina Hardy 02/01/2024, 10:16 AM

## 2024-02-01 NOTE — Progress Notes (Addendum)
 Patient denies SI/HI/AVH this morning. Pt reports that her depression and anxiety are high this morning. Pt reports that she takes klonopin 3x/day at home as well as omeprazole  and zofran  and is requesting to have those medications ordered. Pt is irritable and isolative today, reporting that she does not feel well because she was not given her suboxone  yesterday. Pt has been interactive on the unit and participating in groups throughout the day. Pt has been calm and cooperative throughout the day. Patient has been compliant with medications and treatment plan. Q 15 minute safety checks are in place for patient's safety. Patient is currently safe on the unit.   02/01/24 0921  Psych Admission Type (Psych Patients Only)  Admission Status Voluntary  Psychosocial Assessment  Patient Complaints Anxiety;Depression  Eye Contact Avoids  Facial Expression Anxious  Affect Irritable;Anxious  Speech Logical/coherent  Interaction Isolative  Motor Activity Slow  Appearance/Hygiene Disheveled  Behavior Characteristics Irritable;Guarded  Mood Anxious;Irritable  Thought Process  Coherency WDL  Content WDL  Delusions None reported or observed  Perception WDL  Hallucination None reported or observed  Judgment Impaired  Confusion None  Danger to Self  Current suicidal ideation? Denies  Agreement Not to Harm Self Yes  Description of Agreement Pt verbally contracts for safety  Danger to Others  Danger to Others None reported or observed
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
## 2024-02-01 NOTE — Group Note (Signed)
 LCSW Group Therapy Note   Group Date: 02/01/2024 Start Time: 1100 End Time: 1200   Participation:  did not attend  Type of Therapy:  Group Therapy  Topic: Lifestyle:  from "One Day" to "Today is Day One"  Objective:  To promote mental and physical well-being through lifestyle changes in routine, nutrition, sleep, and movement.  Goals: Increase awareness of how lifestyle habits impact mental health. Encourage one small, achievable wellness goal. Support group sharing and accountability.  Summary:  Group members explored how daily habits influence mental health and discussed the importance of starting with small, manageable changes. Participants identified personal goals and shared reflections on improving structure, sleep, diet, and physical activity.  Therapeutic Modalities: CBT - Identifying and challenging all-or-nothing thinking; promoting realistic, helpful thoughts about change. Psychoeducation - Teaching about the impact of sleep, nutrition, movement, and routine on mental health. Motivational Interviewing - Eliciting personal motivation and exploring readiness for change. Goal-Setting - Supporting SMART goals to build self-efficacy and encourage follow-through.   Tymira Horkey O Cherly Erno, LCSWA 02/01/2024  12:30 PM

## 2024-02-01 NOTE — Group Note (Signed)
 Date:  02/01/2024 Time:  3:56 PM  Group Topic/Focus: Sleep Hygiene Education Dimensions of Wellness:   The focus of this group is to introduce the topic of wellness and discuss the role each dimension of wellness plays in total health.    Participation Level:  Did Not Attend   Kristina Hardy 02/01/2024, 3:56 PM

## 2024-02-01 NOTE — Group Note (Signed)
 Date:  02/01/2024 Time:  11:15 AM  Group Topic/Focus:  Pet Therapy:   This group aims to reduce stress and anxiety by using trained animals as a therapeutic tool.    Participation Level:  Did Not Attend   Kristina Hardy Mars 02/01/2024, 11:15 AM

## 2024-02-01 NOTE — [facility] Suicide Risk Assessment (Signed)
 [facility] Admission Suicide Risk Assessment  The patient presents with acute risk factors for suicide including active depression, endorsed passive SI and substance use concerns. She additionally carries chronic risk factors of unstable housing (over the last 2-3 years), chronic substance use concerns (hx of opioid use, ongoing methamphetamine use), lack of family supports, previous admissions and previous suicidal ideation. These risk factors are mitigated by some protective factors including female gender, history of help-seeking behaviors (presenting frequently to the ED to fulfill basic needs and when in crisis, inconsistent with a desire to die), future orientation (seeking medications to assist with cravings and anxiety), and in a relationship with boyfriend. Current risk of suicide is considered low/moderate. Will begin medications to address underlying anxiety and depression and address main modifiable factor of substance use with motivational interviewing, encouraging sobriety, and assisting with bridging to rehab if agreeable.    I certify that inpatient services furnished can reasonably be expected to improve the patient's condition.   Leita LOISE Arts, MD 02/01/2024, 3:39 PM

## 2024-02-01 NOTE — Plan of Care (Signed)
   Problem: Education: Goal: Knowledge of [facility] Education information/materials will improve Outcome: Progressing Goal: Emotional status will improve Outcome: Progressing Goal: Mental status will improve Outcome: Progressing Goal: Verbalization of understanding the information provided will improve Outcome: Progressing

## 2024-02-01 NOTE — Progress Notes (Addendum)
 Pt spent the entire evening and night sleeping, she was irritable upon approach, I just want to rest. She did not have scheduled medicines and she did not request PRN.   02/01/24 0400  Psych Admission Type (Psych Patients Only)  Admission Status Voluntary  Psychosocial Assessment  Patient Complaints Anxiety  Eye Contact Avoids  Facial Expression Flat  Affect Irritable  Speech UTA  Interaction Isolative  Motor Activity Slow  Appearance/Hygiene In scrubs  Behavior Characteristics Guarded  Mood Irritable  Thought Process  Coherency WDL  Content WDL  Delusions None reported or observed  Perception WDL  Hallucination None reported or observed  Judgment Impaired  Confusion None  Danger to Self  Current suicidal ideation? Denies  Agreement Not to Harm Self Yes  Danger to Others  Danger to Others None reported or observed

## 2024-02-01 NOTE — [facility] Group Notes (Signed)
 Kristina Hardy did not attend wrap up group
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
## 2024-02-02 ENCOUNTER — Encounter (HOSPITAL_COMMUNITY): Payer: Self-pay

## 2024-02-02 MED ORDER — TRAZODONE HCL 50 MG PO TABS
50.0000 mg | ORAL_TABLET | Freq: Every evening | ORAL | Status: DC | PRN
Start: 2024-02-02 — End: 2024-02-06
  Administered 2024-02-02 – 2024-02-05 (×4): 50 mg via ORAL
  Filled 2024-02-02 (×4): qty 1

## 2024-02-02 MED ORDER — BIOTENE DRY MOUTH MT LIQD
15.0000 mL | OROMUCOSAL | Status: DC | PRN
  Filled 2024-02-02: qty 15
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
## 2024-02-02 NOTE — Progress Notes (Signed)
 Shift Note  (Sleep Hours) -  (Any PRNs that were needed, meds refused, or side effects to meds)- PRN Klonopin   (Any disturbances and when (visitation, over night)- Pt became slightly agitated when staff declined her request to go into her locker and look for additional policies. Pt was educated on belongings policy and made aware staff do not regularly go into patient's locker.   (Concerns raised by the patient)-  Pt did not have a PRN order for Trazodone .   Pt requested Trazodone  for insomnia. She stated she had a meeting with the provider and the provider verbalized she will order her Trazodone  for insomnia.   Victoria NP made aware and new orders placed.   (SI/HI/AVH)- Denies

## 2024-02-02 NOTE — Group Note (Signed)
 Recreation Therapy Group Note   Group Topic:Problem Solving  Group Date: 02/02/2024 Start Time: 0945 End Time: 1005 Facilitators: Tranisha Tissue-McCall, LRT,CTRS Location: [address] Dayroom   Group Topic: Problem Solving  Goal Area(s) Addresses:  Patient will effectively work in a team with other group members. Patient will verbalize importance of using appropriate problem solving techniques.  Patient will identify positive change associated with effective problem solving skills.   Behavioral Response:   Intervention: Worksheet  Activity: Dentist. Patients were given a sheet front and back of brain teasers. Patients worked together (or alone) to figure out each puzzle presented.     Education: Problem solving as an important skill in daily life  Education Outcome: Acknowledges understanding/In group clarification offered/Needs additional education.    Affect/Mood: N/A   Participation Level: Did not attend    Clinical Observations/Individualized Feedback:     Plan: Continue to engage patient in RT group sessions 2-3x/week.   Kyzen Horn-McCall, LRT,CTRS 02/02/2024 1:36 PM

## 2024-02-02 NOTE — BH IP Treatment Plan (Signed)
 Interdisciplinary Treatment and Diagnostic Plan Update  02/02/2024 Time of Session: 1015AM Kristina Hardy MRN: [id number]  Principal Diagnosis: Major depressive disorder, recurrent severe without psychotic features (HCC)  Secondary Diagnoses: Principal Problem:   Major depressive disorder, recurrent severe without psychotic features (HCC) Active Problems:   Methamphetamine use disorder, severe, dependence (HCC)   Opioid use disorder in remission   Current Medications:  Current Facility-Administered Medications  Medication Dose Route Frequency Provider Last Rate Last Admin   alum & mag hydroxide-simeth (MAALOX/MYLANTA) 200-200-20 MG/5ML suspension 30 mL  30 mL Oral Q4H PRN Motley-Mangrum, Jadeka A, PMHNP       antiseptic oral rinse (BIOTENE) solution 15 mL  15 mL Mouth Rinse PRN Towana Leita SAILOR, MD       buprenorphine -naloxone  (SUBOXONE ) 8-2 mg per SL tablet 1 tablet  1 tablet Sublingual BID Towana Leita SAILOR, MD   1 tablet at 02/02/24 0751   clonazePAM (KLONOPIN) tablet 0.5 mg  0.5 mg Oral BID PRN Towana Leita SAILOR, MD   0.5 mg at 02/02/24 9073   haloperidol  (HALDOL ) tablet 5 mg  5 mg Oral TID PRN Motley-Mangrum, Jadeka A, PMHNP       And   diphenhydrAMINE  (BENADRYL ) capsule 50 mg  50 mg Oral TID PRN Motley-Mangrum, Jadeka A, PMHNP       haloperidol  lactate (HALDOL ) injection 5 mg  5 mg Intramuscular TID PRN Motley-Mangrum, Jadeka A, PMHNP       And   diphenhydrAMINE  (BENADRYL ) injection 50 mg  50 mg Intramuscular TID PRN Motley-Mangrum, Jadeka A, PMHNP       And   LORazepam  (ATIVAN ) injection 2 mg  2 mg Intramuscular TID PRN Motley-Mangrum, Jadeka A, PMHNP       haloperidol  lactate (HALDOL ) injection 10 mg  10 mg Intramuscular TID PRN Motley-Mangrum, Jadeka A, PMHNP       And   diphenhydrAMINE  (BENADRYL ) injection 50 mg  50 mg Intramuscular TID PRN Motley-Mangrum, Jadeka A, PMHNP       And   LORazepam  (ATIVAN ) injection 2 mg  2 mg Intramuscular TID PRN Motley-Mangrum, Jadeka A, PMHNP        divalproex (DEPAKOTE) DR tablet 500 mg  500 mg Oral QHS Towana Leita SAILOR, MD   500 mg at 02/01/24 2057   hydrOXYzine  (ATARAX ) tablet 25 mg  25 mg Oral TID PRN Motley-Mangrum, Jadeka A, PMHNP       influenza vac split trivalent PF (FLUZONE) injection 0.5 mL  0.5 mL Intramuscular Tomorrow-1000 Pashayan, Alexander S, DO       magnesium  hydroxide (MILK OF MAGNESIA) suspension 30 mL  30 mL Oral Daily PRN Motley-Mangrum, Jadeka A, PMHNP       nicotine  (NICODERM CQ  - dosed in mg/24 hours) patch 21 mg  21 mg Transdermal Daily Motley-Mangrum, Jadeka A, PMHNP   21 mg at 02/02/24 0753   nicotine  polacrilex (NICORETTE ) gum 2 mg  2 mg Oral PRN Motley-Mangrum, Jadeka A, PMHNP   2 mg at 02/01/24 1616   ondansetron  (ZOFRAN -ODT) disintegrating tablet 4 mg  4 mg Oral Q8H PRN Butler, Laura N, MD       PTA Medications: Medications Prior to Admission  Medication Sig Dispense Refill Last Dose/Taking   omeprazole  (PRILOSEC) 40 MG capsule Take 40 mg by mouth daily.   Taking   Buprenorphine  HCl-Naloxone  HCl 8-2 MG FILM Place 1 Film under the tongue daily.      clonazePAM (KLONOPIN) 0.5 MG tablet Take 0.5 mg by mouth 3 (three) times daily.  ondansetron  (ZOFRAN -ODT) 8 MG disintegrating tablet Take 8 mg by mouth 2 (two) times daily as needed for refractory nausea / vomiting, nausea or vomiting.       Patient Stressors: Financial difficulties   Marital or family conflict   Substance abuse    Patient Strengths: Motivation for treatment/growth   Treatment Modalities: Medication Management, Group therapy, Case management,  1 to 1 session with clinician, Psychoeducation, Recreational therapy.   Physician Treatment Plan for Primary Diagnosis: Major depressive disorder, recurrent severe without psychotic features (HCC) Long Term Goal(s):     Short Term Goals:    Medication Management: Evaluate patient's response, side effects, and tolerance of medication regimen.  Therapeutic Interventions: 1 to 1 sessions,  Unit Group sessions and Medication administration.  Evaluation of Outcomes: Not Progressing  Physician Treatment Plan for Secondary Diagnosis: Principal Problem:   Major depressive disorder, recurrent severe without psychotic features (HCC) Active Problems:   Methamphetamine use disorder, severe, dependence (HCC)   Opioid use disorder in remission  Long Term Goal(s):     Short Term Goals:       Medication Management: Evaluate patient's response, side effects, and tolerance of medication regimen.  Therapeutic Interventions: 1 to 1 sessions, Unit Group sessions and Medication administration.  Evaluation of Outcomes: Not Progressing   RN Treatment Plan for Primary Diagnosis: Major depressive disorder, recurrent severe without psychotic features (HCC) Long Term Goal(s): Knowledge of disease and therapeutic regimen to maintain health will improve  Short Term Goals: Ability to remain free from injury will improve, Ability to verbalize frustration and anger appropriately will improve, Ability to demonstrate self-control, Ability to participate in decision making will improve, Ability to verbalize feelings will improve, Ability to disclose and discuss suicidal ideas, Ability to identify and develop effective coping behaviors will improve, and Compliance with prescribed medications will improve  Medication Management: RN will administer medications as ordered by provider, will assess and evaluate patient's response and provide education to patient for prescribed medication. RN will report any adverse and/or side effects to prescribing provider.  Therapeutic Interventions: 1 on 1 counseling sessions, Psychoeducation, Medication administration, Evaluate responses to treatment, Monitor vital signs and CBGs as ordered, Perform/monitor CIWA, COWS, AIMS and Fall Risk screenings as ordered, Perform wound care treatments as ordered.  Evaluation of Outcomes: Not Progressing   LCSW Treatment Plan for  Primary Diagnosis: Major depressive disorder, recurrent severe without psychotic features (HCC) Long Term Goal(s): Safe transition to appropriate next level of care at discharge, Engage patient in therapeutic group addressing interpersonal concerns.  Short Term Goals: Engage patient in aftercare planning with referrals and resources, Increase social support, Increase ability to appropriately verbalize feelings, Increase emotional regulation, Facilitate acceptance of mental health diagnosis and concerns, Facilitate patient progression through stages of change regarding substance use diagnoses and concerns, Identify triggers associated with mental health/substance abuse issues, and Increase skills for wellness and recovery  Therapeutic Interventions: Assess for all discharge needs, 1 to 1 time with Social worker, Explore available resources and support systems, Assess for adequacy in community support network, Educate family and significant other(s) on suicide prevention, Complete Psychosocial Assessment, Interpersonal group therapy.  Evaluation of Outcomes: Not Progressing   Progress in Treatment: Attending groups: No. Participating in groups: No. Taking medication as prescribed: Yes. Toleration medication: Yes. Family/Significant other contact made: No, will contact:  [place] [phone] Patient understands diagnosis: Yes. Discussing patient identified problems/goals with staff: Yes. Medical problems stabilized or resolved: Yes. Denies suicidal/homicidal ideation: Yes. Issues/concerns per patient self-inventory: No.  New problem(s) identified: Yes, Describe:  pt hyperfixated on picking out different clothing and putting on makeup   New Short Term/Long Term Goal(s): medication management for mood stabilization; elimination of SI thoughts; development of comprehensive mental wellness/sobriety plan   Patient Goals:  Increased sleep, call substance use places, and get new  clothing  Discharge Plan or Barriers: Patient recently admitted. CSW will continue to follow and assess for appropriate referrals and possible discharge planning.    Reason for Continuation of Hospitalization: Anxiety Depression Medication stabilization Suicidal ideation  Estimated Length of Stay: 5-7 days  Last 3 [place] Suicide Severity Risk Score: Flowsheet Row Ad[facility] (Current) [facility] Most recent reading at 01/31/2024  2:00 PM ED from 01/31/2024 in [facility] Department at [facility] Most recent reading at 01/31/2024  1:26 AM ED from 01/29/2024 in [facility] Emergency Department at [facility] Most recent reading at 01/29/2024  1:34 AM  C-SSRS RISK CATEGORY Low Risk High Risk No Risk    Last PHQ 2/9 Scores:    06/23/2023   12:01 PM  Depression screen PHQ 2/9  Decreased Interest 0  Down, Depressed, Hopeless 0  PHQ - 2 Score 0  Altered sleeping 0  Tired, decreased energy 0  Change in appetite 0  Feeling bad or failure about yourself  0  Trouble concentrating 0  Moving slowly or fidgety/restless 0  Suicidal thoughts 0  PHQ-9 Score 0  Difficult doing work/chores Not difficult at all    Scribe for Treatment Team: Jenkins LULLA Primer, LCSWA 02/02/2024 12:07 PM

## 2024-02-02 NOTE — Group Note (Deleted)
 Date:  02/02/2024 Time:  10:58 AM  Group Topic/Focus:  Developing a Wellness Toolbox:   The focus of this group is to help patients develop a wellness toolbox with skills and strategies to promote recovery upon discharge. Along will doing a spiritual wellness assesment    Participation Level:  {BHH PARTICIPATION LEVEL:22264}  Participation Quality:  {BHH PARTICIPATION QUALITY:22265}  Affect:  {BHH AFFECT:22266}  Cognitive:  {BHH COGNITIVE:22267}  Insight: {BHH Insight2:20797}  Engagement in Group:  {BHH ENGAGEMENT IN HMNLE:77731}  Modes of Intervention:  {BHH MODES OF INTERVENTION:22269}  Additional Comments:  ***  Terrah Decoster M Alen Matheson 02/02/2024, 10:58 AM

## 2024-02-02 NOTE — BHH Group Notes (Deleted)
 Kristina Hardy did not attend wrap up group

## 2024-02-02 NOTE — Plan of Care (Signed)
   Problem: Education: Goal: Emotional status will improve Outcome: Progressing Goal: Mental status will improve Outcome: Progressing   Problem: Activity: Goal: Interest or engagement in activities will improve Outcome: Progressing

## 2024-02-02 NOTE — Progress Notes (Signed)
 Patient denies SI, AH, VH. Patient stated they slept Fair and that she woke up a few times last night. Scored 6/10 on anxiety. Patient goal is to work on my mood and states it's most important deep breathe, read, talk to others. Patient has been cooperative and med compliant.      02/02/24 0800  Psych Admission Type (Psych Patients Only)  Admission Status Voluntary  Psychosocial Assessment  Patient Complaints Anxiety;Irritability  Eye Contact Fair  Facial Expression Anxious  Affect Irritable  Speech Argumentative  Interaction Defensive;Demanding  Motor Activity Slow  Appearance/Hygiene Unremarkable  Behavior Characteristics Agressive verbally;Irritable  Mood Irritable  Thought Process  Coherency WDL  Content WDL  Delusions None reported or observed  Perception WDL  Hallucination None reported or observed  Judgment Limited  Confusion None  Danger to Self  Current suicidal ideation? Denies  Agreement Not to Harm Self Yes  Description of Agreement Verbal  Danger to Others  Danger to Others None reported or observed

## 2024-02-02 NOTE — Group Note (Signed)
 Date:  02/02/2024 Time:  12:01 PM  Group Topic/Focus: Recreational Therapy  Participation Level:  Did Not Attend   Kristina Hardy 02/02/2024, 12:01 PM

## 2024-02-02 NOTE — BHH Group Notes (Signed)
 BHH Group Notes:  (Nursing/MHT/Case Management/Adjunct)  Date:  02/02/2024  Time:  2000  Type of Therapy:  Narcotics Anonymous Meeting  Participation Level:  Active  Participation Quality:  Appropriate, Attentive, and Supportive  Affect:  Appropriate  Cognitive:  Alert  Insight:  Improving  Engagement in Group:  Engaged  Modes of Intervention:  Clarification, Education, and Support  Summary of Progress/Problems:  Lenora Manuelita RAMAN 02/02/2024, 9:54 PM

## 2024-02-02 NOTE — Group Note (Signed)
 Date:  02/02/2024 Time:  4:33 PM  Group Topic/Focus:  Dimensions of Wellness:   The focus of this group is to introduce the topic of wellness and discuss the role each dimension of wellness plays in total health.    Participation Level:  Active  Participation Quality:  Appropriate  Affect:  Appropriate  Cognitive:  Appropriate  Insight: Appropriate  Engagement in Group:  Engaged  Modes of Intervention:  Discussion  Annalee  Seleta Hovland 02/02/2024, 4:33 PM

## 2024-02-02 NOTE — Group Note (Signed)
 Date:  02/02/2024 Time:  1:52 PM  Group Topic/Focus: Strength. Chapline Group The focus of this group is to identify what feelings patients have difficulty handling and develop a plan to handle them in a healthier way upon discharge.    Participation Level:  Did Not Attend   Kristina Hardy 02/02/2024, 1:52 PM

## 2024-02-02 NOTE — Progress Notes (Signed)
(  Sleep Hours) - 7.5  (Any PRNs that were needed, meds refused, or side effects to meds)- klonopin 0.5mg , no meds refused, no side effects to meds  (Any disturbances and when (visitation, over night)- n/a  (Concerns raised by the patient)- n/a  (SI/HI/AVH)- denies

## 2024-02-02 NOTE — Plan of Care (Signed)
   Problem: Education: Goal: Knowledge of [facility] Education information/materials will improve Outcome: Progressing Goal: Emotional status will improve Outcome: Progressing Goal: Mental status will improve Outcome: Progressing Goal: Verbalization of understanding the information provided will improve Outcome: Progressing
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
## 2024-02-02 NOTE — Group Note (Deleted)
 Date:  02/02/2024 Time:  10:46 AM  Group Topic/Focus:  Orientation:   The focus of this group is to educate the patient on the purpose and policies of crisis stabilization and provide a format to answer questions about their admission.  The group details unit policies and expectations of patients while admitted. Did a worksheet for spiritual health wellness.    Participation Level:  Minimal  Participation Quality:  Appropriate  Affect:  Appropriate  Cognitive:  Alert and Appropriate  Insight: Appropriate  Engagement in Group:  Engaged  Modes of Intervention:  Orientation  Additional Comments:  Patient attended Group and participated in the assessment.  Lessly Stigler M Chad Donoghue 02/02/2024, 10:46 AM

## 2024-02-02 NOTE — Progress Notes (Signed)

## 2024-02-02 NOTE — Group Note (Signed)
 Date:  02/02/2024 Time:  11:19 AM  Group Topic/Focus:  Developing a Wellness Toolbox:   The focus of this group is to help patients develop a wellness toolbox with skills and strategies to promote recovery upon discharge. Orientation:   The focus of this group is to educate the patient on the purpose and policies of crisis stabilization and provide a format to answer questions about their admission.  The group details unit policies and expectations of patients while admitted.  Participation Level:  Minimal  Participation Quality:  Appropriate  Affect:  Appropriate  Cognitive:  Alert and Appropriate  Insight: Good  Engagement in Group:  Engaged  Modes of Intervention:  Orientation  Additional Comments:  Patient attended Group and did assessment task with spiritual wellness.   Kadden Osterhout M Orlinda Slomski 02/02/2024, 11:19 AM

## 2024-02-03 ENCOUNTER — Encounter (HOSPITAL_COMMUNITY): Payer: Self-pay | Admitting: Psychiatry

## 2024-02-03 MED ORDER — DIVALPROEX SODIUM 500 MG PO DR TAB
1000.0000 mg | DELAYED_RELEASE_TABLET | Freq: Every day | ORAL | Status: DC
  Administered 2024-02-03 – 2024-02-05 (×3): 1000 mg via ORAL
  Filled 2024-02-03 (×3): qty 2

## 2024-02-03 MED ORDER — PANTOPRAZOLE SODIUM 40 MG PO TBEC
40.0000 mg | DELAYED_RELEASE_TABLET | Freq: Every day | ORAL | Status: DC
  Administered 2024-02-03 – 2024-02-06 (×4): 40 mg via ORAL
  Filled 2024-02-03 (×4): qty 1

## 2024-02-03 NOTE — Group Note (Signed)
 LCSW Group Therapy Note   Group Date: 02/03/2024 Start Time: 1100 End Time: 1200   Participation:  did not attend  Type of Therapy:  Group Therapy  Topic:  Stress Less:  Nurturing Your Mind and Body Through Calm   Objective:  Learn techniques for managing stress through body relaxation, mindfulness, and self-compassion.  Goals: Use body relaxation techniques, such as Box Breathing and Progressive Muscle Relaxation, to reduce physical tension. Practice mindfulness to break the cycle of overthinking and mental chatter. Embrace self-compassion to handle stress with kindness and resilience.  Summary:  Today's session focused on calming the body with relaxation techniques, breaking the cycle of stress with mindfulness, and using self-compassion to manage challenges more gracefully. These tools help reduce stress and foster a balanced, peaceful mindset.  Therapeutic Modalities used:  Elements of CBT ( cognitive restructuring)  Elements of DBT (box breathing, progressive body relaxation, mindfulness, acceptance)    [name] O Lamont Tant, LCSWA 02/03/2024  12:28 PM

## 2024-02-03 NOTE — Progress Notes (Signed)
 Dar Note: Patient presents with irritable affect and mood.  Denies suicidal  thoughts, auditory and visual hallucinations.  Medications given as prescribed.  Routine safety checks maintained.  Patient requested and received Klonopin  0.5 mg for reports of severe anxiety/agitation.  Stated, I'm angry at the staff for not finding my makeup blush in the locker.  Patient encouraged to use her coping skills.  Patient is safe on the unit.

## 2024-02-03 NOTE — Group Note (Signed)
 Date:  02/03/2024 Time:  1:49 PM  Group Topic/Focus:  Emotional Education:   The focus of this group is to discuss what feelings/emotions are, and how they are experienced.    Participation Level:  Active  Participation Quality:  Appropriate  Affect:  Appropriate  Cognitive:  Appropriate  Insight: Appropriate  Engagement in Group:  Engaged  Modes of Intervention:  Discussion  Additional Comments:  N/A  Jonpaul Lumm A Aamya Orellana 02/03/2024, 1:49 PM

## 2024-02-03 NOTE — Group Note (Signed)
 Date:  02/03/2024 Time:  10:13 AM  Group Topic/Focus:  Goals Group:   The focus of this group is to help patients establish daily goals to achieve during treatment and discuss how the patient can incorporate goal setting into their daily lives to aide in recovery.    Participation Level:  Did Not Attend  Participation Quality:  Did Not Attend  Affect:  Did Not Attend  Cognitive:  Did Not Attend  Insight: None  Engagement in Group:  Did Not Attend  Modes of Intervention:  Did Not Attend  Additional Comments:  Did Not Attend  Ayson Cherubini A Muad Noga 02/03/2024, 10:13 AM
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
## 2024-02-03 NOTE — Plan of Care (Signed)
   Problem: Education: Goal: Emotional status will improve Outcome: Not Progressing Goal: Mental status will improve Outcome: Not Progressing

## 2024-02-03 NOTE — [facility] Group Notes (Signed)
 [facility] Group Notes:  (Nursing/MHT/Case Management/Adjunct)  Date:  02/03/2024  Time:  10:22 PM  Type of Therapy:  Psychoeducational Skills  Participation Level:  Active  Participation Quality:  Appropriate  Affect:  Appropriate  Cognitive:  Appropriate  Insight:  Appropriate  Engagement in Group:  Engaged  Modes of Intervention:  Education  Summary of Progress/Problems: The patient rated her day as an 8 out of 10 and verbalized that she had a good day. She mentioned that she had a good visit with her boyfriend and was able to nap today. She achieved her goal for the day which was to talk to her boyfriend about her discharge plans.   Trebor Galdamez S 02/03/2024, 10:22 PM

## 2024-02-04 NOTE — Group Note (Signed)
 Recreation Therapy Group Note   Group Topic:Problem Solving  Group Date: 02/04/2024 Start Time: 0934 End Time: 1000 Facilitators: Horacio Werth-McCall, LRT,CTRS Location: [address] Dayroom   Group Topic: Halloween Trivia  Goal Area(s) Addresses:  Patient will effectively work in a team with other group members. Patient will verbalize importance of using appropriate problem solving techniques.   Behavioral Response:   Intervention: Trivia  Activity: Set Designer. Patients worked together in teams. LRT read questions from 6 different categories (Classic Horror, Monster Movies, Slasher Flicks, Behind-the-Scenes, Caution! Plot and Beware the Grab Bag). Each group was to right down their answers. LRT would go over the answers with patients at the end of each category. The team with the most combined points (point total from each category) wins the game.    Education: Journalist, Newspaper, Team Building, Communication  Education Outcome: Acknowledges understanding/In group clarification offered/Needs additional education.    Affect/Mood: N/A   Participation Level: Did not attend    Clinical Observations/Individualized Feedback:      Plan: Continue to engage patient in RT group sessions 2-3x/week.   Swayze Pries-McCall, LRT,CTRS 02/04/2024 12:33 PM

## 2024-02-04 NOTE — Group Note (Signed)
 Date:  02/04/2024 Time:  12:29 PM  Group Topic/Focus:  Wellness Toolbox:   The focus of this group is to discuss various aspects of wellness, balancing those aspects and exploring ways to increase the ability to experience wellness.  Patients will create a wellness toolbox for use upon discharge.    Participation Level:  Active  Participation Quality:  Attentive  Affect:  Appropriate  Cognitive:  Alert  Insight: Good  Engagement in Group:  Engaged  Modes of Intervention:  Education  Additional Comments:    Kristina Hardy 02/04/2024, 12:29 PM

## 2024-02-04 NOTE — Group Note (Signed)
 Date:  02/04/2024 Time:  7:03 PM  Group Topic/Focus: 5 Love languages Healthy Communication:   The focus of this group is to discuss communication, barriers to communication, as well as healthy ways to communicate with others.    Participation Level:  Active  Participation Quality:  Appropriate  Affect:  Appropriate  Cognitive:  Appropriate  Insight: Appropriate  Engagement in Group:  Engaged  Modes of Intervention:  Discussion and Education  Additional Comments:    Kristina Hardy 02/04/2024, 7:03 PM

## 2024-02-04 NOTE — Progress Notes (Signed)
 Kristina Hardy   Type of Note: Discharge Planning  Spoke with pt this afternoon who reports she is interested in going to [employer] at discharge. Reports boyfriend is likely able to secure a hotel room for her while she is on the waiting list for a shelter bed at [facility].   Pt given list of shelters and number for [facility] hotline. Encouraged pt to call this afternoon, pt agreeable.  Pt reports looking forward to discharge on Monday or Tuesday.   Signed:  Shaquilla Kehres, LCSW-A 02/04/2024  1:46 PM

## 2024-02-04 NOTE — Group Note (Signed)
 Date:  02/04/2024 Time:  11:38 AM  Group Topic/Focus:  Goals Group:   The focus of this group is to help patients establish daily goals to achieve during treatment and discuss how the patient can incorporate goal setting into their daily lives to aide in recovery.    Participation Level:  Did Not Attend  Participation Quality:  Did Not Attend  Affect:  Did Not Attend  Cognitive:  Did Not Attend  Insight: None  Engagement in Group:  Did Not Attend  Modes of Intervention:  Did Not Attend  Additional Comments:  Did Not Attend  Avelina DELENA Humphreys 02/04/2024, 11:38 AM
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
## 2024-02-04 NOTE — Plan of Care (Signed)
   Problem: Education: Goal: Emotional status will improve Outcome: Progressing Goal: Mental status will improve Outcome: Progressing

## 2024-02-04 NOTE — Progress Notes (Signed)
   02/04/24 1400  Psych Admission Type (Psych Patients Only)  Admission Status Voluntary  Psychosocial Assessment  Patient Complaints Anxiety  Eye Contact Fair  Facial Expression Animated  Affect Appropriate to circumstance  Speech Logical/coherent  Interaction Assertive  Motor Activity Other (Comment)  Appearance/Hygiene Unremarkable  Behavior Characteristics Appropriate to situation  Mood Anxious  Thought Process  Coherency WDL  Content WDL  Delusions None reported or observed  Perception WDL  Hallucination None reported or observed  Judgment Poor  Confusion None  Danger to Self  Current suicidal ideation? Denies  Danger to Others  Danger to Others None reported or observed

## 2024-02-05 NOTE — Progress Notes (Signed)
   02/05/24 1308  Psych Admission Type (Psych Patients Only)  Admission Status Voluntary  Psychosocial Assessment  Patient Complaints Anxiety  Eye Contact Fair  Facial Expression Animated  Affect Appropriate to circumstance  Speech Logical/coherent  Interaction Assertive  Motor Activity Fidgety  Appearance/Hygiene Unremarkable  Behavior Characteristics Cooperative;Appropriate to situation  Mood Anxious  Thought Process  Coherency WDL  Content WDL  Delusions None reported or observed  Perception WDL  Hallucination None reported or observed  Judgment Limited  Confusion None  Danger to Self  Current suicidal ideation? Denies  Agreement Not to Harm Self Yes  Description of Agreement verbal  Danger to Others  Danger to Others None reported or observed

## 2024-02-05 NOTE — Progress Notes (Signed)
   02/04/24 2300  Psych Admission Type (Psych Patients Only)  Admission Status Voluntary  Psychosocial Assessment  Patient Complaints Anxiety  Eye Contact Fair  Facial Expression Animated  Affect Appropriate to circumstance  Speech Logical/coherent  Interaction Assertive  Motor Activity Other (Comment) (WDL)  Appearance/Hygiene Unremarkable  Behavior Characteristics Appropriate to situation  Mood Anxious  Thought Process  Coherency WDL  Content WDL  Delusions None reported or observed  Perception WDL  Hallucination None reported or observed  Judgment Poor  Confusion None  Danger to Self  Current suicidal ideation? Denies  Danger to Others  Danger to Others None reported or observed

## 2024-02-05 NOTE — Group Note (Signed)
 [facility] LCSW Group Therapy Note   Group Date: 02/05/2024 Start Time: 1000 End Time: 1100  Participation Level:  Active   Type of Therapy/Topic:  Group Therapy:  Sleep Hygiene and Healthy Waking Habits  Description of Group:    This group will address the concept of Sleep Hygiene and Healthy Waking Habits and how it feels and looks when one is unbalanced. Patients will be encouraged to process areas in their lives that are out of balance and identify reasons for remaining unbalanced. Facilitators will guide patients utilizing problem- solving interventions to address and correct the stressor making their Sleep Hygiene and Waking Habit unbalanced. Understanding and applying new Sleep Hygiene techniques will be explored and addressed for obtaining and maintaining a balanced life.   Therapeutic Goals: Patient will identify Healthy Habits to maintain good Sleep Hygiene and Waking routine.  Patient will demonstrate ability to communicate their needs through discussion.  Summary of Patient Progress: Present the entire group and partcipated. Kristina Hardy talked about showers being part of her Waking Habit to start her day.    Therapeutic Modalities:   Cognitive Behavioral Therapy Solution-Focused Therapy Assertiveness Training   [place], LCSW

## 2024-02-05 NOTE — [facility] Group Notes (Signed)
 Larinda attended the social work group today, 02/05/24, from 1000-1100.

## 2024-02-05 NOTE — Group Note (Signed)
 Date:  02/05/2024 Time:  5:42 PM  Group Topic/Focus:  Self Care:   The focus of this group is to help patients understand the importance of self-care in order to improve or restore emotional, physical, spiritual, interpersonal, and financial health.    Participation Level:  Active  Participation Quality:  Appropriate  Affect:  Appropriate  Cognitive:  Appropriate  Insight: Appropriate  Engagement in Group:  Engaged  Modes of Intervention:  Discussion and Education  Additional Comments:    Kristina Hardy 02/05/2024, 5:42 PM

## 2024-02-05 NOTE — Group Note (Signed)
 Date:  02/05/2024 Time:  6:54 PM  Group Topic/Focus:  Emotional Wellness   The group session centered on anger management and coping mechanisms, beginning with a TED Talk that provided insights on shifting perspectives and effectively managing anger. After the video, the group engaged in a discussion about the anger cycle, exploring how anger develops and the emotional and physiological responses that accompany it. Patients also examined common triggers of anger and how to identify these triggers in their own lives. The group then focused on distinguishing between healthy and unhealthy coping mechanisms, with a particular emphasis on strategies that promote emotional regulation and positive outcomes.  Throughout the discussion, patients had the opportunity to collaborate by sharing personal experiences and brainstorming a variety of coping strategies. This collective sharing allowed participants to gain new insights and refine their own approaches to managing anger. The group dynamic fostered a supportive environment where patients could learn from each other's experiences, providing both validation and constructive feedback on coping techniques. The session aimed to enhance self-awareness, promote emotional resilience, and encourage the use of adaptive coping strategies in real-life situations.  Participation Level:  Active  Participation Quality:  Appropriate, Attentive, and Sharing  Affect:  Appropriate  Cognitive:  Alert and Appropriate  Insight: Appropriate and Improving  Engagement in Group:  Engaged and Improving  Modes of Intervention:  Discussion, Exploration, Problem-solving, and Socialization  Additional Comments:  Kristina Hardy attended and actively participated in this emotional wellness group.  Kristina Hardy 02/05/2024, 6:54 PM

## 2024-02-05 NOTE — Plan of Care (Signed)
   Problem: Education: Goal: Emotional status will improve Outcome: Progressing Goal: Mental status will improve Outcome: Progressing   Problem: Activity: Goal: Interest or engagement in activities will improve Outcome: Progressing

## 2024-02-05 NOTE — Group Note (Signed)
 Date:  02/05/2024 Time:  6:10 PM  Group Topic/Focus:  Social Wellness  Patients engaged in a bingo game, promoting social interaction and active participation. For patients who chose not to participate in the game, a movie played in the background, allowing them to remain in a social setting without direct involvement in the activity.  The structure of the bingo game supports social wellness by facilitating communication, turn-taking, and group cohesion, while also offering a fun and engaging way to connect with peers. Additionally, the option to watch the movie allowed individuals to remain engaged in the social setting at their own comfort level, enhancing the sense of community and reducing feelings of isolation.  Participation Level:  Active  Participation Quality:  Appropriate and Attentive  Affect:  Appropriate  Cognitive:  Alert and Appropriate  Insight: Appropriate and Improving  Engagement in Group:  Engaged and Improving  Modes of Intervention:  Activity and Socialization  Additional Comments:  Josalynn attended and actively participated in bingo.  Kristi HERO Quay Simkin 02/05/2024, 6:10 PM
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
## 2024-02-05 NOTE — Progress Notes (Signed)
(  Sleep Hours) - 7.25  (Any PRNs that were needed, meds refused, or side effects to meds)- PRN Klonopin and Trazodone   (Any disturbances and when (visitation, over night)- none  (Concerns raised by the patient)-  anxiety  (SI/HI/AVH)- denies

## 2024-02-05 NOTE — Plan of Care (Signed)
   Problem: Education: Goal: Knowledge of [facility] Education information/materials will improve Outcome: Progressing Goal: Emotional status will improve Outcome: Progressing Goal: Mental status will improve Outcome: Progressing Goal: Verbalization of understanding the information provided will improve Outcome: Progressing

## 2024-02-05 NOTE — Group Note (Signed)
 Date:  02/05/2024 Time:  9:29 AM  Group Topic/Focus:  Goals Group:   The focus of this group is to help patients establish daily goals to achieve during treatment and discuss how the patient can incorporate goal setting into their daily lives to aide in recovery.    Participation Level:  Did Not Attend  Participation Quality:  N/A  Affect:  N/A  Cognitive:  N/A  Insight: None  Engagement in Group:  None  Modes of Intervention:  N/A  Additional Comments:  [name] did not attend goals group.  Kristi HERO Kmari Brian 02/05/2024, 9:29 AM

## 2024-02-06 DIAGNOSIS — F411 Generalized anxiety disorder: Secondary | ICD-10-CM

## 2024-02-06 DIAGNOSIS — F1520 Other stimulant dependence, uncomplicated: Secondary | ICD-10-CM

## 2024-02-06 DIAGNOSIS — F332 Major depressive disorder, recurrent severe without psychotic features: Principal | ICD-10-CM

## 2024-02-06 DIAGNOSIS — F1191 Opioid use, unspecified, in remission: Secondary | ICD-10-CM

## 2024-02-06 MED ORDER — CLONAZEPAM 0.5 MG PO TABS: 0.5000 mg | ORAL_TABLET | Freq: Two times a day (BID) | ORAL | 0 refills | Status: AC | PRN

## 2024-02-06 MED ORDER — NICOTINE 21 MG/24HR TD PT24: 21.0000 mg | MEDICATED_PATCH | Freq: Every day | TRANSDERMAL | 0 refills | Status: AC

## 2024-02-06 MED ORDER — BUPRENORPHINE HCL-NALOXONE HCL 8-2 MG SL SUBL: 1.0000 | SUBLINGUAL_TABLET | Freq: Two times a day (BID) | SUBLINGUAL | 0 refills | Status: AC

## 2024-02-06 MED ORDER — TRAZODONE HCL 50 MG PO TABS: 50.0000 mg | ORAL_TABLET | Freq: Every evening | ORAL | 0 refills | Status: AC | PRN

## 2024-02-06 MED ORDER — DIVALPROEX SODIUM 500 MG PO DR TAB: 1000.0000 mg | DELAYED_RELEASE_TABLET | Freq: Every day | ORAL | 0 refills | Status: AC
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
## 2024-02-06 NOTE — [facility] Suicide Risk Assessment (Signed)
 [facility] INPATIENT:  Family/Significant Other Suicide Prevention Education  Suicide Prevention Education:  Education Completed; Patent Attorney,  (patient) has been identified by the patient as the family member/significant other with whom the patient will be residing, and identified as the person(s) who will aid the patient in the event of a mental health crisis (suicidal ideations/suicide attempt).  With written consent from the patient, the family member/significant other has been provided the following suicide prevention education, prior to the and/or following the discharge of the patient.  The suicide prevention education provided includes the following: Suicide risk factors Suicide prevention and interventions National Suicide Hotline telephone number [facility] assessment telephone number [facility] Emergency Assistance 911 [facility] and/or Residential Mobile Crisis Unit telephone number  Request made of family/significant other to: Remove weapons (e.g., guns, rifles, knives), all items previously/currently identified as safety concern.   Remove drugs/medications (over-the-counter, prescriptions, illicit drugs), all items previously/currently identified as a safety concern.  The family member/significant other verbalizes understanding of the suicide prevention education information provided.  The family member/significant other agrees to remove the items of safety concern listed above.   The CSW educated the client about SPE.   Kristina Hardy O Kristina Hardy 02/06/2024, 9:12 AM

## 2024-02-06 NOTE — Plan of Care (Signed)
   Problem: Education: Goal: Emotional status will improve Outcome: Progressing Goal: Mental status will improve Outcome: Progressing Goal: Verbalization of understanding the information provided will improve Outcome: Progressing

## 2024-02-06 NOTE — Progress Notes (Signed)
 Discharge Note:  Patient discharged home with boyfriend.  Suicide prevention information given and discussed with patient who stated she understood and had no questions.  Denied SI and HI.  Denied A/V hallucinations.  Patient stated she appreciated all assistance received from [facility] staff.  All required discharge information given.

## 2024-02-06 NOTE — Progress Notes (Signed)
  [facility] Adult Case Management Discharge Plan :  Will you be returning to the same living situation after discharge:  Yes,  the patient will be going to her boyfriend's house [address] [place], [place], [place] At discharge, do you have transportation home?: Yes,  The patient's boyfriend will be picking her up at 10 am Do you have the ability to pay for your medications: Yes,  The patient stated that she has ability to get her medication  Release of information consent forms completed and in the chart;  Patient's signature needed at discharge.  Patient to Follow up at:  Follow-up Information     [facility] Follow up on 02/18/2024.   Why: You have an appointment for medication management services on 02/18/24 at  2:10 am .  The appointment will be Virtual. Contact information: [address] [place] [address] [phone]         [facility] Go on 02/08/2024.   Specialty: Professional Counselor Why: Please go to this provider on 02/08/24 at 9:00 am for an assessment, to obtain therapy services.  You may also go Monday through Friday, from 9 am to 1 pm. Contact information: [address]  [address] [place] [address] [phone]                 Next level of care provider has access to [facility] Link:no  Safety Planning and Suicide Prevention discussed: Yes,  CSW educate the patient on her SPE. The patient stated that she understand and she knows when to look for the signs.     Has patient been referred to the Quitline?: Patient refused referral for treatment  Patient has been referred for addiction treatment: Patient refused referral for treatment; referral information given to patient at discharge.  Gracey Tolle O Jacquese Hackman, LCSWA 02/06/2024, 9:22 AM

## 2024-02-06 NOTE — [facility] Suicide Risk Assessment (Signed)
 [facility] Discharge Suicide Risk Assessment   The patient presented with acute risk factors for suicide including active depression, endorsed passive SI and substance use concerns. She additionally carries chronic risk factors of unstable housing (over the last 2-3 years), history of substance use, previous admissions and previous suicidal ideation. To address acute risk factors she was admitted voluntarily to inpatient psychiatry with previous effective medications restarted. Resoution of depression and SI within 2-3 days and overall has presented as euthymic in affect and has been denying SI since admission. Additional protective protective factors include female gender, history of help-seeking behaviors, family/community support, no prior suicide attempts, and future orientation. She is currently sober and voicing intent to maintain sobriety. Current and short term risk of suicide is considered low  Outpatient follow up is the most appropriate and least restrictive environment. She is discharging to hotel today with follow up as scheduled below    Follow-up Information     [facility] Follow up on 02/18/2024.   Why: You have an appointment for medication management services on 02/18/24 at  2:10 am .  The appointment will be Virtual. Contact information: [address] [place] [address] [phone]         [facility] Go on 02/08/2024.   Specialty: Professional Counselor Why: Please go to this provider on 02/08/24 at 9:00 am for an assessment, to obtain therapy services.  You may also go Monday through Friday, from 9 am to 1 pm. Contact information: [address]  [address] [place] [address] [phone]                  Leita LOISE Arts, MD 02/06/2024, 8:05 AM

## 2024-02-06 NOTE — Group Note (Signed)
 Date:  02/06/2024 Time:  10:15 AM  Group Topic/Focus:  Goals Group:   The focus of this group is to help patients establish daily goals to achieve during treatment and discuss how the patient can incorporate goal setting into their daily lives to aide in recovery. Orientation:   The focus of this group is to educate the patient on the purpose and policies of crisis stabilization and provide a format to answer questions about their admission.  The group details unit policies and expectations of patients while admitted.    Participation Level:  Did Not Attend   Kristina Hardy 02/06/2024, 10:15 AM
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
## 2024-02-06 NOTE — Plan of Care (Signed)
 Nurse discussed anxiety with patient.

## 2024-02-06 NOTE — Group Note (Signed)
 Date:  02/06/2024 Time:  10:32 AM  Group Topic/Focus: Emotional Wellness Emotional Education:   The focus of this group is to discuss what feelings/emotions are, and how they are experienced. To improve communication skills by teaching participants how to express their feelings and needs without blaming others, which helps reduce conflict and defensiveness. By focusing on the speaker's personal experience, participants learn to express themselves more effectively and empathetically, leading to better understanding and more productive conversations.     Participation Level:  Minimal  Participation Quality:  Inattentive  Affect:  Flat  Cognitive:  Appropriate  Insight: Lacking  Engagement in Group:  Distracting and Lacking  Modes of Intervention:  Discussion and Education  Additional Comments:    Cheney Gosch R Loribeth Katich 02/06/2024, 10:32 AM

## 2024-02-09 NOTE — Progress Notes (Signed)
 The patient attended a screening event on 10/27/2023 where her screening results were a BP of 77/63. At the event the patient noted Dr. Sim as her PCP, Medicaid for insurance, and she is a smoker. Patient declined SDOH screening questions.   Per chart review patient has Dr. Emery Sim as her PCP, [facility] and [facility] for insurance. Patient has food, housing, tobacco, and utility SDOH needs. There are no visible encounters with Dr. Sim within the last 12 months.  CHW was unable to reach patient for f/u. The number provided at the event could not be completed and vm could not be left on the number in the patients chart. Pt was mailed letter with food, housing, and utility resources, Get Care Now flyer, multiple community primary care clinic flyer, & smoking cessation flyer.An additional follow up will be done in according to the health equity team's protocol.

## 2024-02-10 ENCOUNTER — Encounter (HOSPITAL_COMMUNITY): Payer: Self-pay | Admitting: *Deleted

## 2024-02-10 ENCOUNTER — Other Ambulatory Visit: Payer: Self-pay

## 2024-02-10 ENCOUNTER — Emergency Department (HOSPITAL_COMMUNITY)
Admission: EM | Admit: 2024-02-10 | Discharge: 2024-02-10 | Discharge status: Against Medical Advice | Payer: MEDICAID | Attending: Emergency Medicine | Admitting: Emergency Medicine

## 2024-02-10 DIAGNOSIS — Z5321 Procedure and treatment not carried out due to patient leaving prior to being seen by health care provider: Secondary | ICD-10-CM | POA: Diagnosis not present

## 2024-02-10 DIAGNOSIS — J029 Acute pharyngitis, unspecified: Secondary | ICD-10-CM | POA: Insufficient documentation

## 2024-02-10 LAB — GROUP A STREP BY PCR: Group A Strep by PCR: NOT DETECTED

## 2024-02-10 NOTE — ED Triage Notes (Signed)
 Sore throat for 24-48 hours and today she started a earrache on the lt

## 2024-02-10 NOTE — ED Notes (Signed)
Called patient 2 times, no answer. °

## 2024-02-12 ENCOUNTER — Emergency Department (HOSPITAL_COMMUNITY)
Admission: EM | Admit: 2024-02-12 | Discharge: 2024-02-13 | Disposition: A | Discharge status: Home / Self Care | Payer: MEDICAID | Attending: Emergency Medicine | Admitting: Emergency Medicine

## 2024-02-12 ENCOUNTER — Encounter (HOSPITAL_COMMUNITY): Payer: Self-pay

## 2024-02-12 ENCOUNTER — Emergency Department (HOSPITAL_COMMUNITY): Payer: MEDICAID

## 2024-02-12 ENCOUNTER — Other Ambulatory Visit: Payer: Self-pay

## 2024-02-12 DIAGNOSIS — F17210 Nicotine dependence, cigarettes, uncomplicated: Secondary | ICD-10-CM | POA: Diagnosis not present

## 2024-02-12 DIAGNOSIS — G8929 Other chronic pain: Secondary | ICD-10-CM | POA: Insufficient documentation

## 2024-02-12 DIAGNOSIS — M545 Low back pain, unspecified: Secondary | ICD-10-CM | POA: Insufficient documentation

## 2024-02-12 DIAGNOSIS — M549 Dorsalgia, unspecified: Secondary | ICD-10-CM | POA: Diagnosis not present

## 2024-02-12 DIAGNOSIS — Z743 Need for continuous supervision: Secondary | ICD-10-CM | POA: Diagnosis not present

## 2024-02-12 LAB — CBC WITH DIFFERENTIAL/PLATELET
Abs Immature Granulocytes: 0.01 K/uL (ref 0.00–0.07)
Basophils Absolute: 0 K/uL (ref 0.0–0.1)
Basophils Relative: 0 %
Eosinophils Absolute: 0.1 K/uL (ref 0.0–0.5)
Eosinophils Relative: 2 %
HCT: 37 % (ref 36.0–46.0)
Hemoglobin: 11.9 g/dL — ABNORMAL LOW (ref 12.0–15.0)
Immature Granulocytes: 0 %
Lymphocytes Relative: 41 %
Lymphs Abs: 3.1 K/uL (ref 0.7–4.0)
MCH: 29.5 pg (ref 26.0–34.0)
MCHC: 32.2 g/dL (ref 30.0–36.0)
MCV: 91.6 fL (ref 80.0–100.0)
Monocytes Absolute: 0.6 K/uL (ref 0.1–1.0)
Monocytes Relative: 7 %
Neutro Abs: 3.8 K/uL (ref 1.7–7.7)
Neutrophils Relative %: 50 %
Platelets: 254 K/uL (ref 150–400)
RBC: 4.04 MIL/uL (ref 3.87–5.11)
RDW: 11.8 % (ref 11.5–15.5)
WBC: 7.6 K/uL (ref 4.0–10.5)
nRBC: 0 % (ref 0.0–0.2)

## 2024-02-12 LAB — BASIC METABOLIC PANEL WITH GFR
Anion gap: 9 (ref 5–15)
BUN: 14 mg/dL (ref 6–20)
CO2: 28 mmol/L (ref 22–32)
Calcium: 9.2 mg/dL (ref 8.9–10.3)
Chloride: 103 mmol/L (ref 98–111)
Creatinine, Ser: 0.66 mg/dL (ref 0.44–1.00)
GFR, Estimated: >60 mL/min (ref >60)
Glucose, Bld: 138 mg/dL — ABNORMAL HIGH (ref 70–99)
Potassium: 3.5 mmol/L (ref 3.5–5.1)
Sodium: 141 mmol/L (ref 135–145)

## 2024-02-12 LAB — SEDIMENTATION RATE: Sed Rate: 8 mm/h (ref 0–22)

## 2024-02-12 MED ORDER — KETOROLAC TROMETHAMINE 15 MG/ML IJ SOLN
15.0000 mg | Freq: Once | INTRAMUSCULAR | Status: AC
  Administered 2024-02-12: 15 mg via INTRAVENOUS
  Filled 2024-02-12: qty 1

## 2024-02-12 MED ORDER — ONDANSETRON 4 MG PO TBDP
4.0000 mg | ORAL_TABLET | Freq: Once | ORAL | Status: AC
  Administered 2024-02-12: 4 mg via ORAL
  Filled 2024-02-12: qty 1

## 2024-02-12 MED ORDER — ONDANSETRON HCL 4 MG/2ML IJ SOLN
4.0000 mg | Freq: Once | INTRAMUSCULAR | Status: AC
  Administered 2024-02-12: 4 mg via INTRAVENOUS
  Filled 2024-02-12: qty 2

## 2024-02-12 MED ORDER — ACETAMINOPHEN 500 MG PO TABS
1000.0000 mg | ORAL_TABLET | Freq: Once | ORAL | Status: AC
  Administered 2024-02-12: 1000 mg via ORAL
  Filled 2024-02-12: qty 2

## 2024-02-12 MED ORDER — GADOBUTROL 1 MMOL/ML IV SOLN
5.0000 mL | Freq: Once | INTRAVENOUS | Status: AC | PRN
  Administered 2024-02-13: 5 mL via INTRAVENOUS

## 2024-02-12 MED ORDER — METHOCARBAMOL 500 MG PO TABS
500.0000 mg | ORAL_TABLET | Freq: Once | ORAL | Status: AC
  Administered 2024-02-12: 500 mg via ORAL
  Filled 2024-02-12: qty 1
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
## 2024-02-12 NOTE — ED Notes (Signed)
 Pt arrived from [facility] for MRI. A and O x 4. NAD at present

## 2024-02-12 NOTE — ED Provider Notes (Signed)
 [place] EMERGENCY DEPARTMENT AT [facility] Provider Note  CSN: [id number] Arrival date & time: 02/12/24 2013  Chief Complaint(s) Back Pain  HPI Kristina Hardy is a 52 y.o. female who is here today for back pain.  Patient states that she has had chronic back pain for the last 2 years, however the last several days her symptoms have been significantly worse.  She describes pain down the front of her legs, also endorses intermittent bowel and bladder incontinence.  She denies any fever or weakness.  She has been ambulatory.  She has a past history significant for opioid use disorder, has been sober for 2 years.  She does endorse prior IV drug use.  Patient currently experiencing homelessness.  [birth year].   Past Medical History Past Medical History:  Diagnosis Date   Anxiety    Chronic back pain    Depression    Endometriosis    Hepatitis C    Hyperthyroidism    Patient Active Problem List   Diagnosis Date Noted   Opioid use disorder in remission 02/01/2024   Major depressive disorder, recurrent severe without psychotic features (HCC) 01/31/2024   Methamphetamine use disorder, severe, dependence (HCC) 01/16/2024   Homelessness 03/10/2023   Home Medication(s) Prior to Admission medications   Medication Sig Start Date End Date Taking? Authorizing Provider  buprenorphine -naloxone  (SUBOXONE ) 8-2 mg SUBL SL tablet Place 1 tablet under the tongue 2 (two) times daily for 7 days. 02/06/24 02/13/24  Towana Leita SAILOR, MD  clonazePAM (KLONOPIN) 0.5 MG tablet Take 1 tablet (0.5 mg total) by mouth 2 (two) times daily as needed for up to 7 days (high anxiety). 02/06/24 02/13/24  Towana Leita SAILOR, MD  divalproex (DEPAKOTE) 500 MG DR tablet Take 2 tablets (1,000 mg total) by mouth at bedtime. 02/06/24 03/07/24  Towana Leita SAILOR, MD  nicotine  (NICODERM CQ  - DOSED IN MG/24 HOURS) 21 mg/24hr patch Place 1 patch (21 mg total) onto the skin daily for 28 days. 02/06/24 03/05/24  Towana Leita SAILOR, MD  omeprazole  (PRILOSEC) 40 MG capsule Take 40 mg by mouth daily. 11/29/23   [provider]  traZODone  (DESYREL ) 50 MG tablet Take 1 tablet (50 mg total) by mouth at bedtime as needed for sleep. 02/06/24 03/07/24  Towana Leita SAILOR, MD                                                                                                                                    Past Surgical History Past Surgical History:  Procedure Laterality Date   RHINOPLASTY     TONSILLECTOMY     Family History History reviewed. No pertinent family history.  Social History Social History   Tobacco Use   Smoking status: Every Day    Current packs/day: 1.00    Types: Cigarettes  Vaping Use   Vaping status: Never Used  Substance Use Topics   Alcohol use: Never  Drug use: Yes    Types: Amphetamines, Methamphetamines, Heroin   Allergies Nsaids, Robaxin  [methocarbamol ], and Vicodin [hydrocodone-acetaminophen ]  Review of Systems Review of Systems  Physical Exam Vital Signs  I have reviewed the triage vital signs BP (!) 125/93 (BP Location: Right Arm)   Pulse 77   Temp 97.9 F (36.6 C) (Oral)   Resp 18   Ht 5' 6 (1.676 m)   Wt 45.4 kg   LMP 11/17/2011   SpO2 99%   BMI 16.14 kg/m   Physical Exam Vitals and nursing note reviewed.  Constitutional:      Appearance: She is not toxic-appearing.  Eyes:     Pupils: Pupils are equal, round, and reactive to light.  Cardiovascular:     Rate and Rhythm: Normal rate.  Pulmonary:     Effort: Pulmonary effort is normal.  Abdominal:     General: Abdomen is flat.     Palpations: Abdomen is soft.  Neurological:     Mental Status: She is alert.     Comments: 5-5 straight leg bilaterally, 5-5 plantar and dorsi flexion strength.  Intact sensation in the medial, lateral aspects of the leg, no saddle anesthesia.     ED Results and Treatments Labs (all labs ordered are listed, but only abnormal results are displayed) Labs Reviewed  BASIC  METABOLIC PANEL WITH GFR  CBC WITH DIFFERENTIAL/PLATELET  SEDIMENTATION RATE  C-REACTIVE PROTEIN                                                                                                                          Radiology No results found.  Pertinent labs & imaging results that were available during my care of the patient were reviewed by me and considered in my medical decision making (see MDM for details).  Medications Ordered in ED Medications - No data to display                                                                                                                                   Procedures Procedures  (including critical care time)  Medical Decision Making / ED Course   This patient presents to the ED for concern of back pain, bowel and bladder incontinence, this involves an extensive number of treatment options, and is a complaint that carries with it a high risk of complications and morbidity.  The differential diagnosis includes spinal epidural abscess,  cauda equina, chronic back pain.  MDM: Given patient's symptoms and risk factors, she does require MRI to rule out infectious or compressive process.  Her exam is overall benign, I do not believe she requires emergent neurosurgical consultation.  I reviewed her CT scan from July of this year.  Patient will be transferred to [facility] emergency room as we do not have MRI at this time in the ED.  Accepting physician is Dr. Patt.   Additional history obtained:  -External records from outside source obtained and reviewed including: Chart review including previous notes, labs, imaging, consultation notes   Lab Tests: -I ordered, reviewed, and interpreted labs.   The pertinent results include:   Labs Reviewed  BASIC METABOLIC PANEL WITH GFR  CBC WITH DIFFERENTIAL/PLATELET  SEDIMENTATION RATE  C-REACTIVE PROTEIN     Medicines ordered and prescription drug management: No orders of the defined types were  placed in this encounter.   -I have reviewed the patients home medicines and have made adjustments as needed  Cardiac Monitoring: The patient was maintained on a cardiac monitor.  I personally viewed and interpreted the cardiac monitored which showed an underlying rhythm of: Normal sinus rhythm  Social Determinants of Health:  Factors impacting patients care include: Lack of access to primary care, housing insecurity  Reevaluation: After the interventions noted above, I reevaluated the patient and found that they have :stayed the same  Co morbidities that complicate the patient evaluation  Past Medical History:  Diagnosis Date   Anxiety    Chronic back pain    Depression    Endometriosis    Hepatitis C    Hyperthyroidism       Dispostion: Transfer to [facility].    Final Clinical Impression(s) / ED Diagnoses Final diagnoses:  Chronic midline low back pain without sciatica     @PCDICTATION @    Mannie Pac T, DO 02/12/24 2059

## 2024-02-12 NOTE — ED Provider Notes (Incomplete)
 53 yo female with back pain, bowel and bladder incontinence, [facility].  Physical Exam  BP 120/89   Pulse 69   Temp 97.6 F (36.4 C)   Resp 16   Ht 5' 6 (1.676 m)   Wt 45.4 kg   LMP 11/17/2011   SpO2 97%   BMI 16.14 kg/m   Physical Exam  Procedures  Procedures  ED Course / MDM    Medical Decision Making Amount and/or Complexity of Data Reviewed Labs: ordered. Radiology: ordered.  Risk OTC drugs. Prescription drug management.   ***

## 2024-02-12 NOTE — ED Triage Notes (Signed)
 Pt BIB [facility] d/t lower back pain.

## 2024-02-13 LAB — C-REACTIVE PROTEIN: CRP: 0.6 mg/dL (ref <1.0)

## 2024-02-13 MED ORDER — METHOCARBAMOL 500 MG PO TABS: 500.0000 mg | ORAL_TABLET | Freq: Two times a day (BID) | ORAL | 0 refills | Status: AC

## 2024-02-13 NOTE — ED Notes (Signed)
 Pt states that she has the need to urinate and ambulated to bathroom prior to MRI without assist.

## 2024-02-13 NOTE — Discharge Instructions (Signed)
 Take Robaxin  as prescribed.  Do not drive or operate machinery while take this medication. Follow-up with your primary care provider. Please follow-up with neurosurgery, call to schedule appointment.

## 2024-02-22 ENCOUNTER — Ambulatory Visit: Payer: MEDICAID | Admitting: Family Medicine

## 2024-02-22 ENCOUNTER — Encounter: Payer: Self-pay | Admitting: Family Medicine

## 2024-02-22 LAB — GLUCOSE, POCT (MANUAL RESULT ENTRY): POC Glucose: 151 mg/dL — AB (ref 70–99)

## 2024-02-24 NOTE — Progress Notes (Signed)
 Nursing Intake Note Paediatric Nurse Health  Chief Complaint: BP Check  Living Situation: Un-Housed Insurance Status:  Medicaid   New Patient Status:  [x]  New to [employer] reviewed  HIPAA form signed and documented  Consent for digital charting: [x]  Signed []  Not Signed  Additional Notes:  Vital signs taken and entered in flowsheet  Interpreter services: []  Needed [x]  Not Needed  Patient oriented to mobile clinic services and process  SDOH screening completed  Referral to provider: []  Needed [x]  Not Needed  RN Interventions Provided:  [x]  Health education (e.g., chronic disease, hygiene, nutrition)   [x]  Other: Clothing and hygiene provided.

## 2024-04-18 ENCOUNTER — Other Ambulatory Visit: Payer: Self-pay

## 2024-04-18 ENCOUNTER — Other Ambulatory Visit (HOSPITAL_COMMUNITY): Payer: Self-pay

## 2024-04-18 ENCOUNTER — Other Ambulatory Visit: Payer: Self-pay | Admitting: *Deleted

## 2024-04-18 DIAGNOSIS — K649 Unspecified hemorrhoids: Secondary | ICD-10-CM

## 2024-04-18 DIAGNOSIS — L2489 Irritant contact dermatitis due to other agents: Secondary | ICD-10-CM

## 2024-04-18 MED ORDER — PREDNISONE 20 MG PO TABS
40.0000 mg | ORAL_TABLET | Freq: Every day | ORAL | 0 refills | Status: AC
  Filled 2024-04-18: qty 5, 2d supply, fill #0

## 2024-04-18 MED ORDER — HYDROCORTISONE ACETATE 25 MG RE SUPP
25.0000 mg | Freq: Two times a day (BID) | RECTAL | 0 refills | Status: AC
  Filled 2024-04-18: qty 12, 6d supply, fill #0

## 2024-04-18 MED ORDER — TRIAMCINOLONE ACETONIDE 0.1 % EX CREA
1.0000 | TOPICAL_CREAM | Freq: Two times a day (BID) | CUTANEOUS | 0 refills | Status: AC
  Filled 2024-04-18: qty 30, 30d supply, fill #0

## 2024-04-18 MED ORDER — CETIRIZINE HCL 10 MG PO TABS
10.0000 mg | ORAL_TABLET | Freq: Every day | ORAL | 11 refills | Status: AC
  Filled 2024-04-18: qty 30, 30d supply, fill #0

## 2024-04-18 NOTE — Progress Notes (Signed)
 Contact dermatitis from potpourri.  Ok for pred burst, triamcin and antihistamine. Hemorrhoid flair:Ok for hydrocortisone  supp GERD:unrelieved by OTC.  Ok for omeprazole  39
